# Patient Record
Sex: Female | Born: 1955 | Race: White | Hispanic: No | Smoking: Current every day smoker
Health system: Southern US, Community
[De-identification: ages and names within clinical notes are randomized; demographics above are authoritative.]

## PROBLEM LIST (undated history)

## (undated) DIAGNOSIS — F32A Depression, unspecified: Secondary | ICD-10-CM

## (undated) DIAGNOSIS — F419 Anxiety disorder, unspecified: Secondary | ICD-10-CM

## (undated) DIAGNOSIS — G43909 Migraine, unspecified, not intractable, without status migrainosus: Secondary | ICD-10-CM

## (undated) DIAGNOSIS — I10 Essential (primary) hypertension: Secondary | ICD-10-CM

## (undated) DIAGNOSIS — E785 Hyperlipidemia, unspecified: Secondary | ICD-10-CM

## (undated) HISTORY — DX: Depression, unspecified: F32.A

## (undated) HISTORY — PX: BREAST EXCISIONAL BIOPSY: SUR124

## (undated) HISTORY — PX: NASAL SINUS SURGERY: SHX719

## (undated) HISTORY — DX: Anxiety disorder, unspecified: F41.9

## (undated) HISTORY — PX: TUBAL LIGATION: SHX77

## (undated) HISTORY — DX: Essential (primary) hypertension: I10

## (undated) HISTORY — DX: Migraine, unspecified, not intractable, without status migrainosus: G43.909

## (undated) HISTORY — DX: Hyperlipidemia, unspecified: E78.5

## (undated) HISTORY — PX: CATARACT EXTRACTION: SUR2

---

## 1998-01-22 ENCOUNTER — Emergency Department (HOSPITAL_COMMUNITY): Admission: EM | Admit: 1998-01-22 | Discharge: 1998-01-22 | Payer: Self-pay | Admitting: Emergency Medicine

## 1998-01-25 ENCOUNTER — Emergency Department (HOSPITAL_COMMUNITY): Admission: EM | Admit: 1998-01-25 | Discharge: 1998-01-25 | Payer: Self-pay | Admitting: Emergency Medicine

## 1998-01-31 ENCOUNTER — Emergency Department (HOSPITAL_COMMUNITY): Admission: EM | Admit: 1998-01-31 | Discharge: 1998-01-31 | Payer: Self-pay | Admitting: Emergency Medicine

## 1998-03-24 ENCOUNTER — Emergency Department (HOSPITAL_COMMUNITY): Admission: EM | Admit: 1998-03-24 | Discharge: 1998-03-24 | Payer: Self-pay | Admitting: Emergency Medicine

## 1999-04-27 ENCOUNTER — Encounter: Payer: Self-pay | Admitting: *Deleted

## 1999-04-27 ENCOUNTER — Encounter: Admission: RE | Admit: 1999-04-27 | Discharge: 1999-04-27 | Payer: Self-pay | Admitting: *Deleted

## 1999-05-18 ENCOUNTER — Encounter (INDEPENDENT_AMBULATORY_CARE_PROVIDER_SITE_OTHER): Payer: Self-pay | Admitting: *Deleted

## 1999-05-18 ENCOUNTER — Ambulatory Visit (HOSPITAL_BASED_OUTPATIENT_CLINIC_OR_DEPARTMENT_OTHER): Admission: RE | Admit: 1999-05-18 | Discharge: 1999-05-18 | Payer: Self-pay | Admitting: *Deleted

## 2002-09-24 ENCOUNTER — Ambulatory Visit (HOSPITAL_COMMUNITY): Admission: RE | Admit: 2002-09-24 | Discharge: 2002-09-24 | Payer: Self-pay | Admitting: Unknown Physician Specialty

## 2002-09-24 ENCOUNTER — Encounter: Payer: Self-pay | Admitting: Unknown Physician Specialty

## 2009-10-13 ENCOUNTER — Ambulatory Visit (HOSPITAL_COMMUNITY): Admission: RE | Admit: 2009-10-13 | Discharge: 2009-10-13 | Payer: Self-pay | Admitting: Ophthalmology

## 2010-05-12 LAB — BASIC METABOLIC PANEL
BUN: 8 mg/dL (ref 6–23)
CO2: 28 mEq/L (ref 19–32)
Calcium: 9.5 mg/dL (ref 8.4–10.5)
Chloride: 107 mEq/L (ref 96–112)
Creatinine, Ser: 0.67 mg/dL (ref 0.4–1.2)
GFR calc Af Amer: >60 mL/min (ref >60)
GFR calc non Af Amer: >60 mL/min (ref >60)
Glucose, Bld: 69 mg/dL — ABNORMAL LOW (ref 70–99)
Potassium: 4.2 mEq/L (ref 3.5–5.1)
Sodium: 139 mEq/L (ref 135–145)

## 2010-05-12 LAB — HEMOGLOBIN AND HEMATOCRIT, BLOOD
HCT: 37.6 % (ref 36.0–46.0)
Hemoglobin: 12.8 g/dL (ref 12.0–15.0)

## 2010-07-14 NOTE — Op Note (Signed)
[place]  Patient:    Tammy Proctor, Tammy Proctor                     MRN: [id number] Proc. Date: 05/18/99 Adm. Date:  [id number] Attending:  Claudina Lick Dictator:   Doris Cheadle. Lyman Bishop, M.D.                           Operative Report  PREOPERATIVE DIAGNOSES: 1. Deviated nasal septum. 2. Nasal turbinate hypertrophy.  POSTOPERATIVE DIAGNOSES: 1. Deviated nasal septum. 2. Nasal turbinate hypertrophy.  OPERATIONS PERFORMED: 1. Nasal septoplasty. 2. SMR right inferior nasal turbinate. 3. Partial excision of right middle turbinate.  SURGEON:  Dr. Garrison Columbus.  ANESTHESIA:  General.  INDICATIONS:  This patient has had a long-standing history of chronic nasal obstruction with associated headaches, particularly in and around her eyes.  CT  scan of sinuses unremarkable except for a very small amount of mucosal thickening of the right sphenoid sinus and a small ostium of the right frontal sinus.  The  patient is admitted for surgery.  DESCRIPTION OF PROCEDURE:  After satisfactory general endotracheal anesthesia had been induced, topical epinephrine packs were placed intranasally, after which the nasal septum and the right middle and inferior turbinates were infiltrated with 1% Xylocaine containing 1:100,000 epinephrine for hemostasis, after which the nose and face were prepped were Betadine and sterile drapes applied.  Externally, the patients nose was displaced to the right with a mashed appearance. Intranasally, there was a severe septal deviation to the left that looked traumatic in origin  with the entire septum displaced off the maxillary crest to the left with marked enlargement of the right middle and inferior turbinates.  A left anterior septal incision was made, and the mucoperichondrium periosteum elevated initially on the left side only.  The cartilage was dissected up from the maxillary crest, and then partially  separated from the bony septum, after which the mucoperiosteal flap on the right side was then elevated.  A large spur was removed from the left side f the maxillary crest with a chisel, and deviated portion of the periappendicular  plate and vomer were removed with the Jansen-Middleton bone-biting forceps. The almost entire quadrangular cartilage was preserved.  It was then placed in the midline.  A small 1 x 1.5 cm fragment of thin perpendicular plate was replaced s a free graft in the posterior septal flaps, and the cartilage was then secured in the midline over the remaining maxillary crest with three mattress sutures of 5-0 Vicryl placed in the [facility] technique.  An incision was made in the left posterior septal flap for drainage purposes, and the septal incision closed with running -0 chromic gut. The right middle turbinate which bulged medially against the septum was crushed and lateralized with a blunt flat bladed instrument, but the anterior portion still projected into the airway and this was excised with the turbinate  scissors.  Incision was made over the anterior end of the right inferior nasal turbinate which showed bony enlargement projecting towards the midline.  The mucoperiosteum was elevated.  Excess turbinate bone was removed.  The incision as closed with running 5-0 chromic gut.  A small pledget of Surgicel was placed over the resected margin of the right middle turbinate, and each side of the nose was then packed with a folding strip of Telfa gauze coated with Cortisporin ointment.  ESTIMATED BLOOD LOSS:  10 to 15 cc.  The patient tolerated the procedure well.  Was awakened from anesthesia and taken to the recovery room in satisfactory condition. DD:  05/18/99 TD:  05/18/99 Job: [id number]

## 2013-07-06 ENCOUNTER — Encounter (INDEPENDENT_AMBULATORY_CARE_PROVIDER_SITE_OTHER): Payer: Self-pay

## 2013-07-06 ENCOUNTER — Ambulatory Visit (INDEPENDENT_AMBULATORY_CARE_PROVIDER_SITE_OTHER): Payer: Self-pay | Admitting: Family Medicine

## 2013-07-06 ENCOUNTER — Encounter: Payer: Self-pay | Admitting: Family Medicine

## 2013-07-06 VITALS — BP 104/64 | HR 58 | Temp 97.1°F | Ht 64.0 in | Wt 133.4 lb

## 2013-07-06 DIAGNOSIS — W57XXXA Bitten or stung by nonvenomous insect and other nonvenomous arthropods, initial encounter: Secondary | ICD-10-CM

## 2013-07-06 DIAGNOSIS — T148 Other injury of unspecified body region: Secondary | ICD-10-CM

## 2013-07-06 MED ORDER — DOXYCYCLINE HYCLATE 100 MG PO TABS: 100.0000 mg | ORAL_TABLET | Freq: Two times a day (BID) | ORAL | Status: DC

## 2013-07-06 NOTE — Progress Notes (Signed)
   Subjective:    Patient ID: Tammy Proctor, female    DOB: [birth year], 56 y.o.   MRN: [id number]  HPI  This 58 y.o. female presents for evaluation of tick bite and the tick is attached to her left upper abdomen.  Review of Systems C/o tick bite   No chest pain, SOB, HA, dizziness, vision change, N/V, diarrhea, constipation, dysuria, urinary urgency or frequency, myalgias, arthralgias or rash.  Objective:   Physical Exam  Vital signs noted  Well developed well nourished female.  HEENT - Head atraumatic Normocephalic                Eyes - PERRLA, Conjuctiva - clear Sclera- Clear EOMI                Ears - EAC's Wnl TM's Wnl Gross Hearing WNL Respiratory - Lungs CTA bilateral Cardiac - RRR S1 and S2 without murmur GI - Abdomen soft Nontender and bowel sounds active x 4 Skin - Tick attached to left upper quadrant of abdomen and the Tick is pulled out using forceps.      Assessment & Plan:  Tick bite - Plan: doxycycline (VIBRA-TABS) 100 MG tablet po bid x 10 days  Follow up prn  Lysbeth Penner FNP

## 2013-07-07 ENCOUNTER — Encounter: Payer: Self-pay | Admitting: *Deleted

## 2013-07-07 ENCOUNTER — Telehealth: Payer: Self-pay | Admitting: Family Medicine

## 2013-07-07 NOTE — Telephone Encounter (Signed)
Is this okay to do?

## 2013-07-10 ENCOUNTER — Ambulatory Visit: Payer: Self-pay | Admitting: Family Medicine

## 2013-07-10 VITALS — BP 138/88 | HR 80 | Temp 96.3°F | Ht 64.0 in | Wt 132.0 lb

## 2013-07-10 DIAGNOSIS — A088 Other specified intestinal infections: Secondary | ICD-10-CM

## 2013-07-10 DIAGNOSIS — A084 Viral intestinal infection, unspecified: Secondary | ICD-10-CM

## 2013-07-10 MED ORDER — PROMETHAZINE HCL 25 MG PO TABS: 25.0000 mg | ORAL_TABLET | Freq: Three times a day (TID) | ORAL | Status: DC | PRN

## 2013-07-10 NOTE — Progress Notes (Signed)
   Subjective:    Patient ID: Tammy Proctor, female    DOB: Nov 27, 1955, 58 y.o.   MRN: [id number]  HPI This 58 y.o. female presents for evaluation of nausea and she is feeling weak.  She was bitten by A tick and was seen for this a few weeks ago.  She is nauseated this am.  She is taking doxycycline 100mg  po bid.   Review of Systems No chest pain, SOB, HA, dizziness, vision change, N/V, diarrhea, constipation, dysuria, urinary urgency or frequency, myalgias, arthralgias or rash.     Objective:   Physical Exam  Vital signs noted  Well developed well nourished female.  HEENT - Head atraumatic Normocephalic                Eyes - PERRLA, Conjuctiva - clear Sclera- Clear EOMI                Ears - EAC's Wnl TM's Wnl Gross Hearing WNL                 Throat - oropharanx wnl Respiratory - Lungs CTA bilateral Cardiac - RRR S1 and S2 without murmur GI - Abdomen soft Nontender and bowel sounds active x 4 Extremities - No edema. Neuro - Grossly intact.      Assessment & Plan:  Viral gastroenteritis - Plan: promethazine (PHENERGAN) 25 MG tablet One po tid prn Nausea.  Work note for today given  Lysbeth Penner FNP

## 2013-07-16 ENCOUNTER — Telehealth: Payer: Self-pay | Admitting: Family Medicine

## 2013-07-16 ENCOUNTER — Other Ambulatory Visit: Payer: Self-pay | Admitting: Family Medicine

## 2013-07-16 NOTE — Telephone Encounter (Signed)
Follow up.

## 2013-07-21 NOTE — Telephone Encounter (Signed)
Try other OTC creams for itching and if no relief please call for follow up appointment. Try benadryl.

## 2013-07-22 NOTE — Telephone Encounter (Signed)
ntbs

## 2013-09-11 ENCOUNTER — Telehealth: Payer: Self-pay | Admitting: Family Medicine

## 2013-09-11 ENCOUNTER — Other Ambulatory Visit: Payer: Self-pay | Admitting: Family Medicine

## 2013-09-11 DIAGNOSIS — A084 Viral intestinal infection, unspecified: Secondary | ICD-10-CM

## 2013-09-11 MED ORDER — PROMETHAZINE HCL 25 MG PO TABS: 25.0000 mg | ORAL_TABLET | Freq: Three times a day (TID) | ORAL | Status: DC | PRN

## 2013-09-11 NOTE — Telephone Encounter (Signed)
I sent the Rx to the pharmacy.

## 2013-09-28 ENCOUNTER — Other Ambulatory Visit: Payer: Self-pay | Admitting: Nurse Practitioner

## 2013-09-28 ENCOUNTER — Other Ambulatory Visit: Payer: Self-pay | Admitting: Family Medicine

## 2013-09-28 ENCOUNTER — Telehealth: Payer: Self-pay | Admitting: Family Medicine

## 2013-09-28 MED ORDER — ONDANSETRON 8 MG PO TBDP
8.0000 mg | ORAL_TABLET | Freq: Three times a day (TID) | ORAL | Status: DC | PRN
Start: 2013-09-28 — End: 2013-12-02

## 2013-09-28 NOTE — Telephone Encounter (Signed)
Continues to have episodes of nausea.  Explained that phenergan is sedative and I'm not certain that we can keep prescribing this. Also explained that nausea is a symptom that may need further exploration.  What do you suggest?

## 2013-09-28 NOTE — Telephone Encounter (Signed)
zofran rx sent to pharm

## 2013-10-29 ENCOUNTER — Other Ambulatory Visit (HOSPITAL_COMMUNITY): Payer: Self-pay | Admitting: Physician Assistant

## 2013-10-29 DIAGNOSIS — Z1231 Encounter for screening mammogram for malignant neoplasm of breast: Secondary | ICD-10-CM

## 2013-11-04 ENCOUNTER — Ambulatory Visit (HOSPITAL_COMMUNITY)
Admission: RE | Admit: 2013-11-04 | Discharge: 2013-11-04 | Disposition: A | Discharge status: Home / Self Care | Payer: Self-pay | Source: Ambulatory Visit | Attending: Physician Assistant | Admitting: Physician Assistant

## 2013-11-04 DIAGNOSIS — Z1231 Encounter for screening mammogram for malignant neoplasm of breast: Secondary | ICD-10-CM

## 2013-12-02 ENCOUNTER — Ambulatory Visit (INDEPENDENT_AMBULATORY_CARE_PROVIDER_SITE_OTHER): Payer: Self-pay | Admitting: Nurse Practitioner

## 2013-12-02 ENCOUNTER — Encounter: Payer: Self-pay | Admitting: Nurse Practitioner

## 2013-12-02 VITALS — BP 128/74 | HR 71 | Temp 97.8°F | Ht 64.0 in | Wt 119.0 lb

## 2013-12-02 DIAGNOSIS — R52 Pain, unspecified: Secondary | ICD-10-CM

## 2013-12-02 DIAGNOSIS — B9789 Other viral agents as the cause of diseases classified elsewhere: Secondary | ICD-10-CM

## 2013-12-02 DIAGNOSIS — J069 Acute upper respiratory infection, unspecified: Secondary | ICD-10-CM

## 2013-12-02 LAB — POCT INFLUENZA A/B
INFLUENZA B, POC: NEGATIVE
Influenza A, POC: NEGATIVE

## 2013-12-02 NOTE — Patient Instructions (Signed)
1. Take meds as prescribed 2. Use a cool mist humidifier especially during the winter months and when heat has been humid. 3. Use saline nose sprays frequently 4. Saline irrigations of the nose can be very helpful if done frequently.  * 4X daily for 1 week*  * Use of a nettie pot can be helpful with this. Follow directions with this* 5. Drink plenty of fluids 6. Keep thermostat turn down low 7.For any cough or congestion  Use plain Mucinex- regular strength or max strength is fine   * Children- consult with Pharmacist for dosing 8. For fever or aces or pains- take tylenol or ibuprofen appropriate for age and weight.  * for fevers greater than 101 orally you may alternate ibuprofen and tylenol every  3 hours.   Theraflu OTC

## 2013-12-02 NOTE — Progress Notes (Signed)
   Subjective:    Patient ID: Tammy Proctor, female    DOB: 22-Aug-1955, 58 y.o.   MRN: [id number]  HPI Patient in today- Had flu shot Monday and started getting sick yesterday- C/O  Body aches chills and headache.    Review of Systems  Constitutional: Negative.   HENT: Negative.   Respiratory: Negative.   Cardiovascular: Negative.   Gastrointestinal: Positive for nausea, vomiting and diarrhea.  Skin: Negative.   Neurological: Negative.   Psychiatric/Behavioral: Negative.   All other systems reviewed and are negative.      Objective:   Physical Exam  Constitutional: She is oriented to person, place, and time. She appears well-developed and well-nourished.  HENT:  Head: Normocephalic.  Right Ear: External ear normal.  Left Ear: External ear normal.  Nose: Nose normal.  Mouth/Throat: Oropharynx is clear and moist.  Eyes: Pupils are equal, round, and reactive to light.  Neck: Normal range of motion. Neck supple.  Cardiovascular: Normal rate, regular rhythm and normal heart sounds.   Pulmonary/Chest: Effort normal and breath sounds normal.  Abdominal: Soft. Bowel sounds are normal.  Neurological: She is alert and oriented to person, place, and time.  Skin: Skin is warm.  Psychiatric: She has a normal mood and affect. Her behavior is normal. Judgment and thought content normal.  BP 128/74  Pulse 71  Temp(Src) 97.8 F (36.6 C) (Oral)  Ht 5\' 4"  (1.626 m)  Wt 119 lb (53.978 kg)  BMI 20.42 kg/m2 Results for orders placed in visit on 12/02/13  POCT INFLUENZA A/B      Result Value Ref Range   Influenza A, POC Negative     Influenza B, POC Negative             Assessment & Plan:   1. Body aches   2. Viral URI with cough    1. Take meds as prescribed 2. Use a cool mist humidifier especially during the winter months and when heat has been humid. 3. Use saline nose sprays frequently 4. Saline irrigations of the nose can be very helpful if done frequently.  * 4X  daily for 1 week*  * Use of a nettie pot can be helpful with this. Follow directions with this* 5. Drink plenty of fluids 6. Keep thermostat turn down low 7.For any cough or congestion  Use plain Mucinex- regular strength or max strength is fine   * Children- consult with Pharmacist for dosing 8. For fever or aces or pains- take tylenol or ibuprofen appropriate for age and weight.  * for fevers greater than 101 orally you may alternate ibuprofen and tylenol every  3 hours.   May want to try Theraflu OTC  Mary-Margaret Hassell Done, FNP

## 2014-09-17 ENCOUNTER — Other Ambulatory Visit (HOSPITAL_COMMUNITY): Payer: Self-pay | Admitting: Physician Assistant

## 2014-09-17 ENCOUNTER — Other Ambulatory Visit (HOSPITAL_COMMUNITY): Payer: Self-pay | Admitting: Nurse Practitioner

## 2014-09-17 ENCOUNTER — Ambulatory Visit (HOSPITAL_COMMUNITY)
Admission: RE | Admit: 2014-09-17 | Discharge: 2014-09-17 | Disposition: A | Discharge status: Home / Self Care | Payer: Self-pay | Source: Ambulatory Visit | Attending: Physician Assistant | Admitting: Physician Assistant

## 2014-09-17 DIAGNOSIS — Z720 Tobacco use: Secondary | ICD-10-CM | POA: Insufficient documentation

## 2014-09-17 DIAGNOSIS — R634 Abnormal weight loss: Secondary | ICD-10-CM

## 2014-09-17 DIAGNOSIS — R059 Cough, unspecified: Secondary | ICD-10-CM

## 2014-09-17 DIAGNOSIS — R05 Cough: Secondary | ICD-10-CM

## 2015-01-18 ENCOUNTER — Encounter: Payer: Self-pay | Admitting: Physician Assistant

## 2015-01-18 ENCOUNTER — Ambulatory Visit: Payer: Self-pay | Admitting: Physician Assistant

## 2015-01-18 VITALS — BP 110/62 | HR 57 | Temp 97.5°F | Ht 62.0 in | Wt 115.2 lb

## 2015-01-18 DIAGNOSIS — I10 Essential (primary) hypertension: Secondary | ICD-10-CM

## 2015-01-18 DIAGNOSIS — M25521 Pain in right elbow: Secondary | ICD-10-CM

## 2015-01-18 DIAGNOSIS — F17210 Nicotine dependence, cigarettes, uncomplicated: Secondary | ICD-10-CM

## 2015-01-18 MED ORDER — AMLODIPINE BESYLATE 5 MG PO TABS: 5.0000 mg | ORAL_TABLET | Freq: Every day | ORAL | Status: DC

## 2015-01-18 NOTE — Patient Instructions (Signed)
Smoking Cessation, Tips for Success If you are ready to quit smoking, congratulations! You have chosen to help yourself be healthier. Cigarettes bring nicotine, tar, carbon monoxide, and other irritants into your body. Your lungs, heart, and blood vessels will be able to work better without these poisons. There are many different ways to quit smoking. Nicotine gum, nicotine patches, a nicotine inhaler, or nicotine nasal spray can help with physical craving. Hypnosis, support groups, and medicines help break the habit of smoking. WHAT THINGS CAN I DO TO MAKE QUITTING EASIER?  Here are some tips to help you quit for good:  Pick a date when you will quit smoking completely. Tell all of your friends and family about your plan to quit on that date.  Do not try to slowly cut down on the number of cigarettes you are smoking. Pick a quit date and quit smoking completely starting on that day.  Throw away all cigarettes.   Clean and remove all ashtrays from your home, work, and car.  On a card, write down your reasons for quitting. Carry the card with you and read it when you get the urge to smoke.  Cleanse your body of nicotine. Drink enough water and fluids to keep your urine clear or pale yellow. Do this after quitting to flush the nicotine from your body.  Learn to predict your moods. Do not let a bad situation be your excuse to have a cigarette. Some situations in your life might tempt you into wanting a cigarette.  Never have "just one" cigarette. It leads to wanting another and another. Remind yourself of your decision to quit.  Change habits associated with smoking. If you smoked while driving or when feeling stressed, try other activities to replace smoking. Stand up when drinking your coffee. Brush your teeth after eating. Sit in a different chair when you read the paper. Avoid alcohol while trying to quit, and try to drink fewer caffeinated beverages. Alcohol and caffeine may urge you to  smoke.  Avoid foods and drinks that can trigger a desire to smoke, such as sugary or spicy foods and alcohol.  Ask people who smoke not to smoke around you.  Have something planned to do right after eating or having a cup of coffee. For example, plan to take a walk or exercise.  Try a relaxation exercise to calm you down and decrease your stress. Remember, you may be tense and nervous for the first 2 weeks after you quit, but this will pass.  Find new activities to keep your hands busy. Play with a pen, coin, or rubber band. Doodle or draw things on paper.  Brush your teeth right after eating. This will help cut down on the craving for the taste of tobacco after meals. You can also try mouthwash.   Use oral substitutes in place of cigarettes. Try using lemon drops, carrots, cinnamon sticks, or chewing gum. Keep them handy so they are available when you have the urge to smoke.  When you have the urge to smoke, try deep breathing.  Designate your home as a nonsmoking area.  If you are a heavy smoker, ask your health care provider about a prescription for nicotine chewing gum. It can ease your withdrawal from nicotine.  Reward yourself. Set aside the cigarette money you save and buy yourself something nice.  Look for support from others. Join a support group or smoking cessation program. Ask someone at home or at work to help you with your plan   to quit smoking.  Always ask yourself, "Do I need this cigarette or is this just a reflex?" Tell yourself, "Today, I choose not to smoke," or "I do not want to smoke." You are reminding yourself of your decision to quit.  Do not replace cigarette smoking with electronic cigarettes (commonly called e-cigarettes). The safety of e-cigarettes is unknown, and some may contain harmful chemicals.  If you relapse, do not give up! Plan ahead and think about what you will do the next time you get the urge to smoke. HOW WILL I FEEL WHEN I QUIT SMOKING? You  may have symptoms of withdrawal because your body is used to nicotine (the addictive substance in cigarettes). You may crave cigarettes, be irritable, feel very hungry, cough often, get headaches, or have difficulty concentrating. The withdrawal symptoms are only temporary. They are strongest when you first quit but will go away within 10-14 days. When withdrawal symptoms occur, stay in control. Think about your reasons for quitting. Remind yourself that these are signs that your body is healing and getting used to being without cigarettes. Remember that withdrawal symptoms are easier to treat than the major diseases that smoking can cause.  Even after the withdrawal is over, expect periodic urges to smoke. However, these cravings are generally short lived and will go away whether you smoke or not. Do not smoke! WHAT RESOURCES ARE AVAILABLE TO HELP ME QUIT SMOKING? Your health care provider can direct you to community resources or hospitals for support, which may include:  Group support.  Education.  Hypnosis.  Therapy.   This information is not intended to replace advice given to you by your health care provider. Make sure you discuss any questions you have with your health care provider.   Document Released: 11/11/2003 Document Revised: 03/05/2014 Document Reviewed: 07/31/2012 Elsevier Interactive Patient Education 2016 [employer]  

## 2015-01-18 NOTE — Progress Notes (Signed)
BP 110/62 mmHg  Pulse 57  Temp(Src) 97.5 F (36.4 C)  Ht 5\' 2"  (1.575 m)  Wt 115 lb 4 oz (52.277 kg)  BMI 21.07 kg/m2  SpO2 97%   Subjective:    Patient ID: Tammy Proctor, female    DOB: [birth year], 59 y.o.   MRN: [id number]  HPI: Tammy Proctor is a 59 y.o. female presenting on 01/18/2015 for Hypertension; Medication Problem; and Arm Pain   HPI  Chief Complaint  Patient presents with  . Hypertension  . Medication Problem    pt states on some days her medicine makes her feel dizzy or nauseous. pt states she "does not feel right", but it is not a daily occurance    Pt here for her BP today but c/o arm pai- R arm. pt states her arm "throbs to the point she wants to cry"  Pt says it hurts different times of the day.  Usually with lots using it and it throbs and hurts.  States aspirin helps some.    Pt is still smoking  Relevant past medical, surgical, family and social history reviewed and updated as indicated. Interim medical history since our last visit reviewed. Allergies and medications reviewed and updated.  Current outpatient prescriptions:  .  atenolol (TENORMIN) 50 MG tablet, Take 50 mg by mouth daily., Disp: , Rfl:    Review of Systems  Constitutional: Positive for chills, appetite change, fatigue and unexpected weight change. Negative for fever and diaphoresis.  HENT: Positive for congestion, dental problem, ear pain, sneezing and sore throat. Negative for drooling, facial swelling, hearing loss, mouth sores, trouble swallowing and voice change.   Eyes: Positive for pain, redness, itching and visual disturbance. Negative for discharge.  Respiratory: Positive for cough. Negative for choking, shortness of breath and wheezing.   Cardiovascular: Negative for chest pain, palpitations and leg swelling.  Gastrointestinal: Negative for vomiting, abdominal pain, diarrhea, constipation and blood in stool.  Endocrine: Positive for cold intolerance. Negative for  heat intolerance and polydipsia.  Genitourinary: Negative for dysuria, hematuria and decreased urine volume.  Musculoskeletal: Positive for back pain and arthralgias. Negative for gait problem.  Skin: Negative for rash.  Allergic/Immunologic: Positive for environmental allergies.  Neurological: Positive for light-headedness and headaches. Negative for seizures and syncope.  Hematological: Negative for adenopathy.  Psychiatric/Behavioral: Positive for agitation. Negative for suicidal ideas and dysphoric mood. The patient is nervous/anxious.     Per HPI unless specifically indicated above     Objective:    BP 110/62 mmHg  Pulse 57  Temp(Src) 97.5 F (36.4 C)  Ht 5\' 2"  (1.575 m)  Wt 115 lb 4 oz (52.277 kg)  BMI 21.07 kg/m2  SpO2 97%  Wt Readings from Last 3 Encounters:  01/18/15 115 lb 4 oz (52.277 kg)  12/02/13 119 lb (53.978 kg)  07/10/13 132 lb (59.875 kg)    Physical Exam  Constitutional: She is oriented to person, place, and time. She appears well-developed and well-nourished.  HENT:  Head: Normocephalic and atraumatic.  Neck: Neck supple.  Cardiovascular: Normal rate and regular rhythm.   Pulmonary/Chest: Effort normal and breath sounds normal.  Abdominal: Soft. Bowel sounds are normal. She exhibits no mass. There is no tenderness.  Musculoskeletal: She exhibits no edema.  Lymphadenopathy:    She has no cervical adenopathy.  Neurological: She is alert and oriented to person, place, and time.  Skin: Skin is warm and dry.  Psychiatric: She has a normal mood and affect. Her behavior is  normal.  Vitals reviewed.       Assessment & Plan:   Encounter Diagnoses  Name Primary?  . Essential hypertension, benign Yes  . Cigarette nicotine dependence, uncomplicated   . Pain in joint, upper arm, right    -Change bp meds to amlodipine 5- d/c atenolol -counseled on smoking cessation -f/u 1 mo to recheck bp

## 2015-01-22 DIAGNOSIS — F17210 Nicotine dependence, cigarettes, uncomplicated: Secondary | ICD-10-CM

## 2015-01-22 DIAGNOSIS — I10 Essential (primary) hypertension: Secondary | ICD-10-CM | POA: Insufficient documentation

## 2015-01-22 DIAGNOSIS — Z720 Tobacco use: Secondary | ICD-10-CM | POA: Insufficient documentation

## 2015-01-22 DIAGNOSIS — F17200 Nicotine dependence, unspecified, uncomplicated: Secondary | ICD-10-CM | POA: Insufficient documentation

## 2015-01-25 ENCOUNTER — Encounter: Payer: Self-pay | Admitting: Physician Assistant

## 2015-01-25 ENCOUNTER — Ambulatory Visit: Payer: Self-pay | Admitting: Physician Assistant

## 2015-01-25 VITALS — BP 110/58 | HR 60 | Temp 97.7°F | Ht 63.0 in | Wt 111.5 lb

## 2015-01-25 DIAGNOSIS — J069 Acute upper respiratory infection, unspecified: Secondary | ICD-10-CM

## 2015-01-25 MED ORDER — AMOXICILLIN 500 MG PO CAPS: 500.0000 mg | ORAL_CAPSULE | Freq: Three times a day (TID) | ORAL | Status: DC

## 2015-01-25 NOTE — Progress Notes (Signed)
BP 110/58 mmHg  Pulse 60  Temp(Src) 97.7 F (36.5 C)  Ht 5\' 3"  (1.6 m)  Wt 111 lb 8 oz (50.576 kg)  BMI 19.76 kg/m2  SpO2 95%   Subjective:    Patient ID: Tammy Proctor, female    DOB: 09/12/1955, 59 y.o.   MRN: [id number]  HPI: Tammy Proctor is a 59 y.o. female presenting on 01/25/2015 for URI and Sinus Problem   URI  The current episode started in the past 7 days (onset last wednesday). There has been no fever. Associated symptoms include congestion, coughing, ear pain, headaches, nausea, neck pain, rhinorrhea, sinus pain and sneezing. Pertinent negatives include no abdominal pain, chest pain, diarrhea, dysuria, rash, sore throat, swollen glands, vomiting or wheezing. She has tried NSAIDs (saline spray) for the symptoms. The treatment provided mild relief.   Chief Complaint  Patient presents with  . URI    mouth and teeth hurt off and on for 1 week  . Sinus Problem    for a week, nasal drainage is thick and dark     Relevant past medical, surgical, family and social history reviewed and updated as indicated. Interim medical history since our last visit reviewed. Allergies and medications reviewed and updated.   Current outpatient prescriptions:  .  atenolol (TENORMIN) 50 MG tablet, Take 50 mg by mouth daily., Disp: , Rfl:  .  amLODipine (NORVASC) 5 MG tablet, Take 1 tablet (5 mg total) by mouth daily. (Patient not taking: Reported on 01/25/2015), Disp: 90 tablet, Rfl: 3   Review of Systems  Constitutional: Positive for chills, diaphoresis and fatigue. Negative for fever, appetite change and unexpected weight change.  HENT: Positive for congestion, dental problem, ear pain, facial swelling, rhinorrhea and sneezing. Negative for drooling, hearing loss, mouth sores, sore throat, trouble swallowing and voice change.   Eyes: Positive for pain, discharge, redness, itching and visual disturbance.  Respiratory: Positive for cough and shortness of breath. Negative for  choking and wheezing.   Cardiovascular: Negative for chest pain, palpitations and leg swelling.  Gastrointestinal: Positive for nausea. Negative for vomiting, abdominal pain, diarrhea, constipation and blood in stool.  Endocrine: Positive for cold intolerance and heat intolerance. Negative for polydipsia.  Genitourinary: Negative for dysuria, hematuria and decreased urine volume.  Musculoskeletal: Positive for back pain, arthralgias and neck pain. Negative for gait problem.  Skin: Negative for rash.  Allergic/Immunologic: Positive for environmental allergies.  Neurological: Positive for light-headedness and headaches. Negative for seizures and syncope.  Hematological: Negative for adenopathy.  Psychiatric/Behavioral: Positive for agitation. Negative for suicidal ideas and dysphoric mood. The patient is nervous/anxious.     Per HPI unless specifically indicated above     Objective:    BP 110/58 mmHg  Pulse 60  Temp(Src) 97.7 F (36.5 C)  Ht 5\' 3"  (1.6 m)  Wt 111 lb 8 oz (50.576 kg)  BMI 19.76 kg/m2  SpO2 95%  Wt Readings from Last 3 Encounters:  01/25/15 111 lb 8 oz (50.576 kg)  01/18/15 115 lb 4 oz (52.277 kg)  12/02/13 119 lb (53.978 kg)    Physical Exam  Constitutional: She is oriented to person, place, and time. She appears well-developed and well-nourished.  HENT:  Head: Normocephalic and atraumatic.  Right Ear: Hearing, tympanic membrane, external ear and ear canal normal.  Left Ear: Hearing, tympanic membrane, external ear and ear canal normal.  Nose: Nose normal.  Mouth/Throat: Uvula is midline and oropharynx is clear and moist. No oropharyngeal exudate.  Face  without swelling  Neck: Neck supple.  Cardiovascular: Normal rate and regular rhythm.   Pulmonary/Chest: Effort normal and breath sounds normal. She has no wheezes.  Lymphadenopathy:    She has no cervical adenopathy.  Neurological: She is alert and oriented to person, place, and time.  Skin: Skin is warm  and dry.  Psychiatric: She has a normal mood and affect. Her behavior is normal.  Vitals reviewed.       Assessment & Plan:   Encounter Diagnosis  Name Primary?  . URI, acute Yes    No smoking rx amoxil otc's prn symptomatic relief

## 2015-02-23 ENCOUNTER — Ambulatory Visit: Payer: Self-pay | Admitting: Physician Assistant

## 2015-03-02 ENCOUNTER — Encounter: Payer: Self-pay | Admitting: Physician Assistant

## 2015-03-02 ENCOUNTER — Ambulatory Visit: Payer: Self-pay | Admitting: Physician Assistant

## 2015-03-02 VITALS — BP 130/72 | HR 68 | Temp 97.2°F | Ht 63.0 in | Wt 116.8 lb

## 2015-03-02 DIAGNOSIS — I10 Essential (primary) hypertension: Secondary | ICD-10-CM

## 2015-03-02 DIAGNOSIS — F17210 Nicotine dependence, cigarettes, uncomplicated: Secondary | ICD-10-CM

## 2015-03-02 NOTE — Patient Instructions (Signed)
Smoking Cessation, Tips for Success If you are ready to quit smoking, congratulations! You have chosen to help yourself be healthier. Cigarettes bring nicotine, tar, carbon monoxide, and other irritants into your body. Your lungs, heart, and blood vessels will be able to work better without these poisons. There are many different ways to quit smoking. Nicotine gum, nicotine patches, a nicotine inhaler, or nicotine nasal spray can help with physical craving. Hypnosis, support groups, and medicines help break the habit of smoking. WHAT THINGS CAN I DO TO MAKE QUITTING EASIER?  Here are some tips to help you quit for good:  Pick a date when you will quit smoking completely. Tell all of your friends and family about your plan to quit on that date.  Do not try to slowly cut down on the number of cigarettes you are smoking. Pick a quit date and quit smoking completely starting on that day.  Throw away all cigarettes.   Clean and remove all ashtrays from your home, work, and car.  On a card, write down your reasons for quitting. Carry the card with you and read it when you get the urge to smoke.  Cleanse your body of nicotine. Drink enough water and fluids to keep your urine clear or pale yellow. Do this after quitting to flush the nicotine from your body.  Learn to predict your moods. Do not let a bad situation be your excuse to have a cigarette. Some situations in your life might tempt you into wanting a cigarette.  Never have "just one" cigarette. It leads to wanting another and another. Remind yourself of your decision to quit.  Change habits associated with smoking. If you smoked while driving or when feeling stressed, try other activities to replace smoking. Stand up when drinking your coffee. Brush your teeth after eating. Sit in a different chair when you read the paper. Avoid alcohol while trying to quit, and try to drink fewer caffeinated beverages. Alcohol and caffeine may urge you to  smoke.  Avoid foods and drinks that can trigger a desire to smoke, such as sugary or spicy foods and alcohol.  Ask people who smoke not to smoke around you.  Have something planned to do right after eating or having a cup of coffee. For example, plan to take a walk or exercise.  Try a relaxation exercise to calm you down and decrease your stress. Remember, you may be tense and nervous for the first 2 weeks after you quit, but this will pass.  Find new activities to keep your hands busy. Play with a pen, coin, or rubber band. Doodle or draw things on paper.  Brush your teeth right after eating. This will help cut down on the craving for the taste of tobacco after meals. You can also try mouthwash.   Use oral substitutes in place of cigarettes. Try using lemon drops, carrots, cinnamon sticks, or chewing gum. Keep them handy so they are available when you have the urge to smoke.  When you have the urge to smoke, try deep breathing.  Designate your home as a nonsmoking area.  If you are a heavy smoker, ask your health care provider about a prescription for nicotine chewing gum. It can ease your withdrawal from nicotine.  Reward yourself. Set aside the cigarette money you save and buy yourself something nice.  Look for support from others. Join a support group or smoking cessation program. Ask someone at home or at work to help you with your plan   to quit smoking.  Always ask yourself, "Do I need this cigarette or is this just a reflex?" Tell yourself, "Today, I choose not to smoke," or "I do not want to smoke." You are reminding yourself of your decision to quit.  Do not replace cigarette smoking with electronic cigarettes (commonly called e-cigarettes). The safety of e-cigarettes is unknown, and some may contain harmful chemicals.  If you relapse, do not give up! Plan ahead and think about what you will do the next time you get the urge to smoke. HOW WILL I FEEL WHEN I QUIT SMOKING? You  may have symptoms of withdrawal because your body is used to nicotine (the addictive substance in cigarettes). You may crave cigarettes, be irritable, feel very hungry, cough often, get headaches, or have difficulty concentrating. The withdrawal symptoms are only temporary. They are strongest when you first quit but will go away within 10-14 days. When withdrawal symptoms occur, stay in control. Think about your reasons for quitting. Remind yourself that these are signs that your body is healing and getting used to being without cigarettes. Remember that withdrawal symptoms are easier to treat than the major diseases that smoking can cause.  Even after the withdrawal is over, expect periodic urges to smoke. However, these cravings are generally short lived and will go away whether you smoke or not. Do not smoke! WHAT RESOURCES ARE AVAILABLE TO HELP ME QUIT SMOKING? Your health care provider can direct you to community resources or hospitals for support, which may include:  Group support.  Education.  Hypnosis.  Therapy.   This information is not intended to replace advice given to you by your health care provider. Make sure you discuss any questions you have with your health care provider.   Document Released: 11/11/2003 Document Revised: 03/05/2014 Document Reviewed: 07/31/2012 Elsevier Interactive Patient Education 2016 [employer]  

## 2015-03-02 NOTE — Progress Notes (Signed)
BP 130/72 mmHg  Pulse 68  Temp(Src) 97.2 F (36.2 C)  Ht 5\' 3"  (1.6 m)  Wt 116 lb 12.8 oz (52.98 kg)  BMI 20.70 kg/m2  SpO2 99%   Subjective:    Patient ID: Tammy Proctor, female    DOB: [birth year], 60 y.o.   MRN: [id number]  HPI: Tammy Proctor is a 60 y.o. female presenting on 03/02/2015 for Hypertension and Arm Pain   HPI Pt did not get started on her new mediciine b/c her son picked up the old rx.  She still wants to change to the new one   Pt states R arm pain comes and goes, worse with cold weather.  She injured it in the past- fell while decorating for christmas last year.  Relevant past medical, surgical, family and social history reviewed and updated as indicated. Interim medical history since our last visit reviewed. Allergies and medications reviewed and updated.  Current outpatient prescriptions:  .  atenolol (TENORMIN) 50 MG tablet, Take 50 mg by mouth daily., Disp: , Rfl:  .  amLODipine (NORVASC) 5 MG tablet, Take 1 tablet (5 mg total) by mouth daily. (Patient not taking: Reported on 01/25/2015), Disp: 90 tablet, Rfl: 3  Review of Systems  Constitutional: Positive for chills, diaphoresis and fatigue. Negative for fever, appetite change and unexpected weight change.  HENT: Positive for congestion and dental problem. Negative for drooling, ear pain, facial swelling, hearing loss, mouth sores, sneezing, sore throat, trouble swallowing and voice change.   Eyes: Positive for pain, redness, itching and visual disturbance. Negative for discharge.  Respiratory: Positive for cough and shortness of breath. Negative for choking and wheezing.   Cardiovascular: Negative for chest pain, palpitations and leg swelling.  Gastrointestinal: Negative for vomiting, abdominal pain, diarrhea, constipation and blood in stool.  Endocrine: Positive for cold intolerance and heat intolerance. Negative for polydipsia.  Genitourinary: Negative for dysuria, hematuria and decreased urine  volume.  Musculoskeletal: Positive for back pain and arthralgias. Negative for gait problem.  Skin: Negative for rash.  Allergic/Immunologic: Positive for environmental allergies.  Neurological: Positive for headaches. Negative for seizures, syncope and light-headedness.  Hematological: Negative for adenopathy.  Psychiatric/Behavioral: Negative for suicidal ideas, dysphoric mood and agitation. The patient is nervous/anxious.     Per HPI unless specifically indicated above     Objective:    BP 130/72 mmHg  Pulse 68  Temp(Src) 97.2 F (36.2 C)  Ht 5\' 3"  (1.6 m)  Wt 116 lb 12.8 oz (52.98 kg)  BMI 20.70 kg/m2  SpO2 99%  Wt Readings from Last 3 Encounters:  03/02/15 116 lb 12.8 oz (52.98 kg)  01/25/15 111 lb 8 oz (50.576 kg)  01/18/15 115 lb 4 oz (52.277 kg)    Physical Exam  Constitutional: She is oriented to person, place, and time. She appears well-developed and well-nourished.  HENT:  Head: Normocephalic and atraumatic.  Neck: Neck supple.  Cardiovascular: Normal rate and regular rhythm.   Pulmonary/Chest: Effort normal and breath sounds normal.  Abdominal: Soft. Bowel sounds are normal. She exhibits no mass. There is no tenderness.  Musculoskeletal: She exhibits no edema.  Lymphadenopathy:    She has no cervical adenopathy.  Neurological: She is alert and oriented to person, place, and time.  Skin: Skin is warm and dry.  Psychiatric: She has a normal mood and affect. Her behavior is normal.  Vitals reviewed.       Assessment & Plan:    Encounter Diagnoses  Name Primary?  Marland Kitchen  Essential hypertension, benign Yes  . Cigarette nicotine dependence, uncomplicated      Can change bp med at next refill Counseled on smoking cessation F/u 6 wk to check bp on new medicaiton

## 2015-03-31 ENCOUNTER — Other Ambulatory Visit: Payer: Self-pay | Admitting: Physician Assistant

## 2015-04-04 ENCOUNTER — Encounter: Payer: Self-pay | Admitting: Physician Assistant

## 2015-04-04 ENCOUNTER — Ambulatory Visit: Payer: Self-pay | Admitting: Physician Assistant

## 2015-04-04 VITALS — BP 124/70 | HR 79 | Temp 97.9°F | Ht 63.0 in | Wt 114.8 lb

## 2015-04-04 DIAGNOSIS — F4321 Adjustment disorder with depressed mood: Secondary | ICD-10-CM

## 2015-04-04 DIAGNOSIS — I10 Essential (primary) hypertension: Secondary | ICD-10-CM

## 2015-04-04 NOTE — Progress Notes (Signed)
   BP 124/70 mmHg  Pulse 79  Temp(Src) 97.9 F (36.6 C)  Ht 5\' 3"  (1.6 m)  Wt 114 lb 12.8 oz (52.073 kg)  BMI 20.34 kg/m2  SpO2 99%   Subjective:    Patient ID: Tammy Proctor, female    DOB: Nov 04, 1955, 60 y.o.   MRN: [id number]  HPI: Tammy Proctor is a 60 y.o. female presenting on 04/04/2015 for Mental Health Problem   HPI Pt states she has had problems since death of her daughter in law last Monday.  She feels very anxious.  No HI or SI.  Just very upset.  Relevant past medical, surgical, family and social history reviewed and updated as indicated. Interim medical history since our last visit reviewed. Allergies and medications reviewed and updated.   Current outpatient prescriptions:  .  amLODipine (NORVASC) 5 MG tablet, Take 1 tablet (5 mg total) by mouth daily., Disp: 90 tablet, Rfl: 3   Review of Systems  Constitutional: Positive for appetite change and fatigue. Negative for fever, chills, diaphoresis and unexpected weight change.  HENT: Positive for congestion, dental problem and sneezing. Negative for drooling, ear pain, facial swelling, hearing loss, mouth sores, sore throat, trouble swallowing and voice change.   Eyes: Positive for itching. Negative for pain, discharge, redness and visual disturbance.  Respiratory: Positive for cough. Negative for choking, shortness of breath and wheezing.   Cardiovascular: Negative for chest pain, palpitations and leg swelling.  Gastrointestinal: Negative for vomiting, abdominal pain, diarrhea, constipation and blood in stool.  Endocrine: Positive for cold intolerance and heat intolerance. Negative for polydipsia.  Genitourinary: Negative for dysuria, hematuria and decreased urine volume.  Musculoskeletal: Positive for back pain and arthralgias. Negative for gait problem.  Skin: Negative for rash.  Allergic/Immunologic: Positive for environmental allergies.  Neurological: Positive for headaches. Negative for seizures, syncope  and light-headedness.  Hematological: Negative for adenopathy.  Psychiatric/Behavioral: Positive for dysphoric mood and agitation. Negative for suicidal ideas. The patient is nervous/anxious.     Per HPI unless specifically indicated above     Objective:    BP 124/70 mmHg  Pulse 79  Temp(Src) 97.9 F (36.6 C)  Ht 5\' 3"  (1.6 m)  Wt 114 lb 12.8 oz (52.073 kg)  BMI 20.34 kg/m2  SpO2 99%  Wt Readings from Last 3 Encounters:  04/04/15 114 lb 12.8 oz (52.073 kg)  03/02/15 116 lb 12.8 oz (52.98 kg)  01/25/15 111 lb 8 oz (50.576 kg)    Physical Exam  Constitutional: She is oriented to person, place, and time. She appears well-developed and well-nourished.  HENT:  Head: Normocephalic and atraumatic.  Neck: Neck supple.  Cardiovascular: Normal rate and regular rhythm.   Pulmonary/Chest: Effort normal and breath sounds normal.  Abdominal: Soft. Bowel sounds are normal. She exhibits no mass. There is no hepatosplenomegaly. There is no tenderness.  Musculoskeletal: She exhibits no edema.  Lymphadenopathy:    She has no cervical adenopathy.  Neurological: She is alert and oriented to person, place, and time.  Skin: Skin is warm and dry.  Psychiatric: She has a normal mood and affect. Her behavior is normal.  Vitals reviewed.       Assessment & Plan:   Encounter Diagnoses  Name Primary?  . Situational depression Yes  . Essential hypertension, benign     -bp improved today. Continue current rx -Go to walk in at [facility] today when leaves office.  Pt agrees. -f/u 6 wk to check bp

## 2015-04-13 ENCOUNTER — Ambulatory Visit: Payer: Self-pay | Admitting: Physician Assistant

## 2015-05-16 ENCOUNTER — Ambulatory Visit: Payer: Self-pay | Admitting: Physician Assistant

## 2015-05-16 ENCOUNTER — Encounter: Payer: Self-pay | Admitting: Physician Assistant

## 2015-05-16 VITALS — BP 142/74 | HR 89 | Temp 99.3°F | Ht 63.0 in | Wt 115.2 lb

## 2015-05-16 DIAGNOSIS — I10 Essential (primary) hypertension: Secondary | ICD-10-CM

## 2015-05-16 DIAGNOSIS — F17210 Nicotine dependence, cigarettes, uncomplicated: Secondary | ICD-10-CM

## 2015-05-16 DIAGNOSIS — F4321 Adjustment disorder with depressed mood: Secondary | ICD-10-CM | POA: Insufficient documentation

## 2015-05-16 MED ORDER — AMLODIPINE BESYLATE 10 MG PO TABS: 10.0000 mg | ORAL_TABLET | Freq: Every day | ORAL | Status: DC

## 2015-05-16 NOTE — Progress Notes (Signed)
   BP 142/74 mmHg  Pulse 89  Temp(Src) 99.3 F (37.4 C)  Ht 5\' 3"  (1.6 m)  Wt 115 lb 4 oz (52.277 kg)  BMI 20.42 kg/m2  SpO2 99%   Subjective:    Patient ID: Tammy Proctor, female    DOB: 11/09/1955, 60 y.o.   MRN: [id number]  HPI: Tammy Proctor is a 60 y.o. female presenting on 05/16/2015 for Hypertension   HPI Pt got pap done.   She never went in to [facility] facility.  Pt had labs in September 2016 which were good  Relevant past medical, surgical, family and social history reviewed and updated as indicated. Interim medical history since our last visit reviewed. Allergies and medications reviewed and updated.   Current outpatient prescriptions:  .  amLODipine (NORVASC) 5 MG tablet, Take 1 tablet (5 mg total) by mouth daily., Disp: 90 tablet, Rfl: 3   Review of Systems  Constitutional: Positive for fatigue. Negative for fever, chills, diaphoresis, appetite change and unexpected weight change.  HENT: Positive for congestion, dental problem and sneezing. Negative for drooling, ear pain, facial swelling, hearing loss, mouth sores, sore throat, trouble swallowing and voice change.   Eyes: Positive for discharge, itching and visual disturbance. Negative for pain and redness.  Respiratory: Positive for cough and shortness of breath. Negative for choking and wheezing.   Cardiovascular: Negative for chest pain, palpitations and leg swelling.  Gastrointestinal: Negative for vomiting, abdominal pain, diarrhea, constipation and blood in stool.  Endocrine: Positive for cold intolerance and heat intolerance. Negative for polydipsia.  Genitourinary: Negative for dysuria, hematuria and decreased urine volume.  Musculoskeletal: Positive for back pain and arthralgias. Negative for gait problem.  Skin: Negative for rash.  Allergic/Immunologic: Positive for environmental allergies.  Neurological: Negative for seizures, syncope, light-headedness and headaches.  Hematological: Negative for  adenopathy.  Psychiatric/Behavioral: Positive for agitation. Negative for suicidal ideas and dysphoric mood. The patient is nervous/anxious.     Per HPI unless specifically indicated above     Objective:    BP 142/74 mmHg  Pulse 89  Temp(Src) 99.3 F (37.4 C)  Ht 5\' 3"  (1.6 m)  Wt 115 lb 4 oz (52.277 kg)  BMI 20.42 kg/m2  SpO2 99%  Wt Readings from Last 3 Encounters:  05/16/15 115 lb 4 oz (52.277 kg)  04/04/15 114 lb 12.8 oz (52.073 kg)  03/02/15 116 lb 12.8 oz (52.98 kg)    Physical Exam  Constitutional: She is oriented to person, place, and time. She appears well-developed and well-nourished.  HENT:  Head: Normocephalic and atraumatic.  Neck: Neck supple.  Cardiovascular: Normal rate and regular rhythm.   Pulmonary/Chest: Effort normal and breath sounds normal.  Abdominal: Soft. Bowel sounds are normal. She exhibits no mass. There is no hepatosplenomegaly. There is no tenderness.  Musculoskeletal: She exhibits no edema.  Lymphadenopathy:    She has no cervical adenopathy.  Neurological: She is alert and oriented to person, place, and time.  Skin: Skin is warm and dry.  Psychiatric: She has a normal mood and affect. Her behavior is normal.  Vitals reviewed.       Assessment & Plan:   Encounter Diagnoses  Name Primary?  . Essential hypertension, benign Yes  . Cigarette nicotine dependence, uncomplicated   . Situational depression      -Increase amlodipine to 10- sign up for [facility] -Pt to go to [facility] -will get PAP report- record request sent -F/u 1 mo to recheck bp

## 2015-06-13 ENCOUNTER — Ambulatory Visit: Payer: Self-pay | Admitting: Physician Assistant

## 2015-06-13 ENCOUNTER — Encounter: Payer: Self-pay | Admitting: Physician Assistant

## 2015-06-13 VITALS — BP 130/70 | HR 93 | Temp 98.1°F | Ht 63.0 in | Wt 112.0 lb

## 2015-06-13 DIAGNOSIS — I10 Essential (primary) hypertension: Secondary | ICD-10-CM

## 2015-06-13 DIAGNOSIS — F17210 Nicotine dependence, cigarettes, uncomplicated: Secondary | ICD-10-CM

## 2015-06-13 DIAGNOSIS — J309 Allergic rhinitis, unspecified: Secondary | ICD-10-CM

## 2015-06-13 NOTE — Progress Notes (Signed)
   BP 130/70 mmHg  Pulse 93  Temp(Src) 98.1 F (36.7 C)  Ht 5\' 3"  (1.6 m)  Wt 112 lb (50.803 kg)  BMI 19.84 kg/m2  SpO2 99%   Subjective:    Patient ID: Tammy Proctor, female    DOB: 1956-02-23, 60 y.o.   MRN: [id number]  HPI: Tammy Proctor is a 60 y.o. female presenting on 06/13/2015 for Hypertension   HPI   Pt doing well except for some allergies, she says.  She is still smoking.  Relevant past medical, surgical, family and social history reviewed and updated as indicated. Interim medical history since our last visit reviewed. Allergies and medications reviewed and updated.  Current outpatient prescriptions:  .  amLODipine (NORVASC) 10 MG tablet, Take 1 tablet (10 mg total) by mouth daily., Disp: 30 tablet, Rfl: 3   Review of Systems  Constitutional: Positive for fatigue. Negative for fever, activity change and unexpected weight change.  HENT: Positive for congestion, dental problem and sneezing. Negative for drooling, facial swelling, hearing loss, mouth sores, sore throat, trouble swallowing and voice change.   Eyes: Positive for itching. Negative for discharge.  Respiratory: Positive for cough and shortness of breath. Negative for choking and wheezing.   Cardiovascular: Negative for chest pain, palpitations and leg swelling.  Gastrointestinal: Negative for diarrhea, constipation and blood in stool.  Endocrine: Positive for cold intolerance and heat intolerance. Negative for polydipsia.  Genitourinary: Negative for dysuria, hematuria and decreased urine volume.  Musculoskeletal: Positive for back pain and arthralgias. Negative for gait problem.  Neurological: Negative for syncope and headaches.  Hematological: Negative for adenopathy.  Psychiatric/Behavioral: Positive for dysphoric mood and agitation. Negative for suicidal ideas. The patient is nervous/anxious.     Per HPI unless specifically indicated above     Objective:    BP 130/70 mmHg  Pulse 93   Temp(Src) 98.1 F (36.7 C)  Ht 5\' 3"  (1.6 m)  Wt 112 lb (50.803 kg)  BMI 19.84 kg/m2  SpO2 99%  Wt Readings from Last 3 Encounters:  06/13/15 112 lb (50.803 kg)  05/16/15 115 lb 4 oz (52.277 kg)  04/04/15 114 lb 12.8 oz (52.073 kg)    Physical Exam  Constitutional: She is oriented to person, place, and time. She appears well-developed and well-nourished.  HENT:  Head: Normocephalic and atraumatic.  Right Ear: Hearing, tympanic membrane, external ear and ear canal normal.  Left Ear: Hearing, tympanic membrane, external ear and ear canal normal.  Nose: Rhinorrhea present.  Mouth/Throat: Uvula is midline and oropharynx is clear and moist. No oropharyngeal exudate.  Neck: Neck supple.  Cardiovascular: Normal rate and regular rhythm.   Pulmonary/Chest: Effort normal and breath sounds normal. She has no wheezes.  Abdominal: Soft. Bowel sounds are normal. She exhibits no mass. There is no hepatosplenomegaly. There is no tenderness.  Musculoskeletal: She exhibits no edema.  Lymphadenopathy:    She has no cervical adenopathy.  Neurological: She is alert and oriented to person, place, and time.  Skin: Skin is warm and dry.  Psychiatric: She has a normal mood and affect. Her behavior is normal.  Vitals reviewed.       Assessment & Plan:   Encounter Diagnoses  Name Primary?  . Essential hypertension, benign Yes  . Cigarette nicotine dependence, uncomplicated   . Allergic rhinitis, unspecified allergic rhinitis type     -recommend pt get otc claritan for allergies -Continue current medications -counseled on smoking cessation -F/u 3 months. RTO sooner prn

## 2015-06-23 ENCOUNTER — Encounter: Payer: Self-pay | Admitting: Physician Assistant

## 2015-07-13 ENCOUNTER — Ambulatory Visit: Payer: Self-pay | Admitting: Physician Assistant

## 2015-07-14 ENCOUNTER — Other Ambulatory Visit: Payer: Self-pay | Admitting: Physician Assistant

## 2015-07-14 MED ORDER — AMLODIPINE BESYLATE 10 MG PO TABS
10.0000 mg | ORAL_TABLET | Freq: Every day | ORAL | Status: DC
Start: 2015-07-14 — End: 2015-10-04

## 2015-07-18 ENCOUNTER — Encounter: Payer: Self-pay | Admitting: Physician Assistant

## 2015-09-05 ENCOUNTER — Other Ambulatory Visit: Payer: Self-pay

## 2015-09-05 DIAGNOSIS — I10 Essential (primary) hypertension: Secondary | ICD-10-CM

## 2015-09-09 LAB — BASIC METABOLIC PANEL
BUN: 12 mg/dL (ref 7–25)
CALCIUM: 10 mg/dL (ref 8.6–10.4)
CO2: 24 mmol/L (ref 20–31)
CREATININE: 1.02 mg/dL (ref 0.50–1.05)
Chloride: 105 mmol/L (ref 98–110)
GLUCOSE: 92 mg/dL (ref 65–99)
POTASSIUM: 4.6 mmol/L (ref 3.5–5.3)
Sodium: 139 mmol/L (ref 135–146)

## 2015-09-12 ENCOUNTER — Ambulatory Visit: Payer: Self-pay | Admitting: Physician Assistant

## 2015-09-12 ENCOUNTER — Encounter: Payer: Self-pay | Admitting: Physician Assistant

## 2015-09-12 VITALS — BP 126/72 | HR 77 | Temp 97.9°F | Ht 63.0 in | Wt 114.0 lb

## 2015-09-12 DIAGNOSIS — I10 Essential (primary) hypertension: Secondary | ICD-10-CM

## 2015-09-12 DIAGNOSIS — F39 Unspecified mood [affective] disorder: Secondary | ICD-10-CM

## 2015-09-12 DIAGNOSIS — F17210 Nicotine dependence, cigarettes, uncomplicated: Secondary | ICD-10-CM

## 2015-09-12 DIAGNOSIS — Z1239 Encounter for other screening for malignant neoplasm of breast: Secondary | ICD-10-CM

## 2015-09-12 NOTE — Patient Instructions (Signed)
Contact [facility] or [facility] for [place]  Smoking Cessation, Tips for Success If you are ready to quit smoking, congratulations! You have chosen to help yourself be healthier. Cigarettes bring nicotine, tar, carbon monoxide, and other irritants into your body. Your lungs, heart, and blood vessels will be able to work better without these poisons. There are many different ways to quit smoking. Nicotine gum, nicotine patches, a nicotine inhaler, or nicotine nasal spray can help with physical craving. Hypnosis, support groups, and medicines help break the habit of smoking. WHAT THINGS CAN I DO TO MAKE QUITTING EASIER?  Here are some tips to help you quit for good:  Pick a date when you will quit smoking completely. Tell all of your friends and family about your plan to quit on that date.  Do not try to slowly cut down on the number of cigarettes you are smoking. Pick a quit date and quit smoking completely starting on that day.  Throw away all cigarettes.   Clean and remove all ashtrays from your home, work, and car.  On a card, write down your reasons for quitting. Carry the card with you and read it when you get the urge to smoke.  Cleanse your body of nicotine. Drink enough water and fluids to keep your urine clear or pale yellow. Do this after quitting to flush the nicotine from your body.  Learn to predict your moods. Do not let a bad situation be your excuse to have a cigarette. Some situations in your life might tempt you into wanting a cigarette.  Never have "just one" cigarette. It leads to wanting another and another. Remind yourself of your decision to quit.  Change habits associated with smoking. If you smoked while driving or when feeling stressed, try other activities to replace smoking. Stand up when drinking your coffee. Brush your teeth after eating. Sit in a different chair when you read the paper. Avoid alcohol while trying to quit, and try to drink fewer  caffeinated beverages. Alcohol and caffeine may urge you to smoke.  Avoid foods and drinks that can trigger a desire to smoke, such as sugary or spicy foods and alcohol.  Ask people who smoke not to smoke around you.  Have something planned to do right after eating or having a cup of coffee. For example, plan to take a walk or exercise.  Try a relaxation exercise to calm you down and decrease your stress. Remember, you may be tense and nervous for the first 2 weeks after you quit, but this will pass.  Find new activities to keep your hands busy. Play with a pen, coin, or rubber band. Doodle or draw things on paper.  Brush your teeth right after eating. This will help cut down on the craving for the taste of tobacco after meals. You can also try mouthwash.   Use oral substitutes in place of cigarettes. Try using lemon drops, carrots, cinnamon sticks, or chewing gum. Keep them handy so they are available when you have the urge to smoke.  When you have the urge to smoke, try deep breathing.  Designate your home as a nonsmoking area.  If you are a heavy smoker, ask your health care provider about a prescription for nicotine chewing gum. It can ease your withdrawal from nicotine.  Reward yourself. Set aside the cigarette money you save and buy yourself something nice.  Look for support from others. Join a support group or smoking cessation program. Ask someone at  home or at work to help you with your plan to quit smoking.  Always ask yourself, "Do I need this cigarette or is this just a reflex?" Tell yourself, "Today, I choose not to smoke," or "I do not want to smoke." You are reminding yourself of your decision to quit.  Do not replace cigarette smoking with electronic cigarettes (commonly called e-cigarettes). The safety of e-cigarettes is unknown, and some may contain harmful chemicals.  If you relapse, do not give up! Plan ahead and think about what you will do the next time you get  the urge to smoke. HOW WILL I FEEL WHEN I QUIT SMOKING? You may have symptoms of withdrawal because your body is used to nicotine (the addictive substance in cigarettes). You may crave cigarettes, be irritable, feel very hungry, cough often, get headaches, or have difficulty concentrating. The withdrawal symptoms are only temporary. They are strongest when you first quit but will go away within 10-14 days. When withdrawal symptoms occur, stay in control. Think about your reasons for quitting. Remind yourself that these are signs that your body is healing and getting used to being without cigarettes. Remember that withdrawal symptoms are easier to treat than the major diseases that smoking can cause.  Even after the withdrawal is over, expect periodic urges to smoke. However, these cravings are generally short lived and will go away whether you smoke or not. Do not smoke! WHAT RESOURCES ARE AVAILABLE TO HELP ME QUIT SMOKING? Your health care provider can direct you to community resources or hospitals for support, which may include:  Group support.  Education.  Hypnosis.  Therapy.   This information is not intended to replace advice given to you by your health care provider. Make sure you discuss any questions you have with your health care provider.   Document Released: 11/11/2003 Document Revised: 03/05/2014 Document Reviewed: 07/31/2012 Elsevier Interactive Patient Education [employer].

## 2015-09-12 NOTE — Progress Notes (Signed)
BP 126/72 mmHg  Pulse 77  Temp(Src) 97.9 F (36.6 C)  Ht 5\' 3"  (1.6 m)  Wt 114 lb (51.71 kg)  BMI 20.20 kg/m2  SpO2 98%   Subjective:    Patient ID: Tammy Proctor, female    DOB: October 09, 1955, 60 y.o.   MRN: [id number]  HPI: Tammy Proctor is a 60 y.o. female presenting on 09/12/2015 for Hypertension   HPI   Pt has still not been to [facility] center.  She has contact information- she says she just hasn't gotten around to it.  She is still having lots of anxiety and depression  Relevant past medical, surgical, family and social history reviewed and updated as indicated. Interim medical history since our last visit reviewed. Allergies and medications reviewed and updated.   Current outpatient prescriptions:  .  amLODipine (NORVASC) 10 MG tablet, Take 1 tablet (10 mg total) by mouth daily., Disp: 90 tablet, Rfl: 0   Review of Systems  Constitutional: Positive for chills, diaphoresis and fatigue. Negative for fever, appetite change and unexpected weight change.  HENT: Positive for dental problem, ear pain and sneezing. Negative for congestion, drooling, facial swelling, hearing loss, mouth sores, sore throat, trouble swallowing and voice change.   Eyes: Positive for redness and itching. Negative for pain, discharge and visual disturbance.  Respiratory: Positive for cough and shortness of breath. Negative for choking and wheezing.   Cardiovascular: Negative for chest pain, palpitations and leg swelling.  Gastrointestinal: Negative for vomiting, abdominal pain, diarrhea, constipation and blood in stool.  Endocrine: Positive for cold intolerance and heat intolerance. Negative for polydipsia.  Genitourinary: Negative for dysuria, hematuria and decreased urine volume.  Musculoskeletal: Positive for back pain and arthralgias. Negative for gait problem.  Skin: Negative for rash.  Allergic/Immunologic: Positive for environmental allergies.  Neurological: Positive for headaches. Negative  for seizures, syncope and light-headedness.  Hematological: Negative for adenopathy.  Psychiatric/Behavioral: Positive for dysphoric mood and agitation. Negative for suicidal ideas. The patient is nervous/anxious.     Per HPI unless specifically indicated above     Objective:    BP 126/72 mmHg  Pulse 77  Temp(Src) 97.9 F (36.6 C)  Ht 5\' 3"  (1.6 m)  Wt 114 lb (51.71 kg)  BMI 20.20 kg/m2  SpO2 98%  Wt Readings from Last 3 Encounters:  09/12/15 114 lb (51.71 kg)  06/13/15 112 lb (50.803 kg)  05/16/15 115 lb 4 oz (52.277 kg)    Physical Exam  Constitutional: She is oriented to person, place, and time. She appears well-developed and well-nourished.  HENT:  Head: Normocephalic and atraumatic.  Right Ear: Hearing, tympanic membrane, external ear and ear canal normal.  Left Ear: Hearing, tympanic membrane, external ear and ear canal normal.  Nose: Nose normal.  Mouth/Throat: Uvula is midline and oropharynx is clear and moist. No oropharyngeal exudate.  Neck: Neck supple.  Cardiovascular: Normal rate and regular rhythm.   Pulmonary/Chest: Effort normal and breath sounds normal. She has no wheezes.  Abdominal: Soft. Bowel sounds are normal. She exhibits no mass. There is no hepatosplenomegaly. There is no tenderness.  Musculoskeletal: She exhibits no edema.  Lymphadenopathy:    She has no cervical adenopathy.  Neurological: She is alert and oriented to person, place, and time.  Skin: Skin is warm and dry.  Psychiatric: She has a normal mood and affect. Her behavior is normal.  Vitals reviewed.   Results for orders placed or performed in visit on [id number]  Basic Metabolic Panel (BMET)  Result  Value Ref Range   Sodium 139 135 - 146 mmol/L   Potassium 4.6 3.5 - 5.3 mmol/L   Chloride 105 98 - 110 mmol/L   CO2 24 20 - 31 mmol/L   Glucose, Bld 92 65 - 99 mg/dL   BUN 12 7 - 25 mg/dL   Creat 1.02 0.50 - 1.05 mg/dL   Calcium 10.0 8.6 - 10.4 mg/dL      Assessment & Plan:    Encounter Diagnoses  Name Primary?  . Essential hypertension, benign Yes  . Cigarette nicotine dependence, uncomplicated   . Mood disorder ([place])   . Screening for breast cancer     -reviewed labs with pt -order screening mammogram -urged pt to contact [place] -counseled smoking cessation -Continue current medications.  F/u 4 months.  RTO sooner prn

## 2015-09-21 ENCOUNTER — Ambulatory Visit (HOSPITAL_COMMUNITY): Admission: RE | Admit: 2015-09-21 | Payer: Self-pay | Source: Ambulatory Visit

## 2015-09-21 ENCOUNTER — Other Ambulatory Visit: Payer: Self-pay | Admitting: Physician Assistant

## 2015-09-21 ENCOUNTER — Encounter (HOSPITAL_COMMUNITY): Payer: Self-pay

## 2015-09-21 ENCOUNTER — Ambulatory Visit (HOSPITAL_COMMUNITY)
Admission: RE | Admit: 2015-09-21 | Discharge: 2015-09-21 | Disposition: A | Discharge status: Home / Self Care | Payer: Self-pay | Source: Ambulatory Visit | Attending: Physician Assistant | Admitting: Physician Assistant

## 2015-09-21 DIAGNOSIS — Z1231 Encounter for screening mammogram for malignant neoplasm of breast: Secondary | ICD-10-CM

## 2015-10-04 ENCOUNTER — Other Ambulatory Visit: Payer: Self-pay | Admitting: Physician Assistant

## 2015-11-28 ENCOUNTER — Ambulatory Visit: Payer: Self-pay

## 2016-01-16 ENCOUNTER — Encounter: Payer: Self-pay | Admitting: Physician Assistant

## 2016-01-16 ENCOUNTER — Ambulatory Visit: Payer: Self-pay | Admitting: Physician Assistant

## 2016-01-16 VITALS — BP 133/81 | HR 100 | Temp 98.2°F

## 2016-01-16 DIAGNOSIS — F39 Unspecified mood [affective] disorder: Secondary | ICD-10-CM

## 2016-01-16 DIAGNOSIS — I10 Essential (primary) hypertension: Secondary | ICD-10-CM

## 2016-01-16 DIAGNOSIS — R062 Wheezing: Secondary | ICD-10-CM

## 2016-01-16 DIAGNOSIS — F17218 Nicotine dependence, cigarettes, with other nicotine-induced disorders: Secondary | ICD-10-CM

## 2016-01-16 MED ORDER — PREDNISONE 10 MG PO TABS: ORAL_TABLET | ORAL | 0 refills | Status: AC

## 2016-01-16 NOTE — Patient Instructions (Signed)
Upper Respiratory Infection, Adult Most upper respiratory infections (URIs) are a viral infection of the air passages leading to the lungs. A URI affects the nose, throat, and upper air passages. The most common type of URI is nasopharyngitis and is typically referred to as "the common cold." URIs run their course and usually go away on their own. Most of the time, a URI does not require medical attention, but sometimes a bacterial infection in the upper airways can follow a viral infection. This is called a secondary infection. Sinus and middle ear infections are common types of secondary upper respiratory infections. Bacterial pneumonia can also complicate a URI. A URI can worsen asthma and chronic obstructive pulmonary disease (COPD). Sometimes, these complications can require emergency medical care and may be life threatening. What are the causes? Almost all URIs are caused by viruses. A virus is a type of germ and can spread from one person to another. What increases the risk? You may be at risk for a URI if:  You smoke.  You have chronic heart or lung disease.  You have a weakened defense (immune) system.  You are very young or very old.  You have nasal allergies or asthma.  You work in crowded or poorly ventilated areas.  You work in health care facilities or schools.  What are the signs or symptoms? Symptoms typically develop 2-3 days after you come in contact with a cold virus. Most viral URIs last 7-10 days. However, viral URIs from the influenza virus (flu virus) can last 14-18 days and are typically more severe. Symptoms may include:  Runny or stuffy (congested) nose.  Sneezing.  Cough.  Sore throat.  Headache.  Fatigue.  Fever.  Loss of appetite.  Pain in your forehead, behind your eyes, and over your cheekbones (sinus pain).  Muscle aches.  How is this diagnosed? Your health care provider may diagnose a URI by:  Physical exam.  Tests to check that your  symptoms are not due to another condition such as: ? Strep throat. ? Sinusitis. ? Pneumonia. ? Asthma.  How is this treated? A URI goes away on its own with time. It cannot be cured with medicines, but medicines may be prescribed or recommended to relieve symptoms. Medicines may help:  Reduce your fever.  Reduce your cough.  Relieve nasal congestion.  Follow these instructions at home:  Take medicines only as directed by your health care provider.  Gargle warm saltwater or take cough drops to comfort your throat as directed by your health care provider.  Use a warm mist humidifier or inhale steam from a shower to increase air moisture. This may make it easier to breathe.  Drink enough fluid to keep your urine clear or pale yellow.  Eat soups and other clear broths and maintain good nutrition.  Rest as needed.  Return to work when your temperature has returned to normal or as your health care provider advises. You may need to stay home longer to avoid infecting others. You can also use a face mask and careful hand washing to prevent spread of the virus.  Increase the usage of your inhaler if you have asthma.  Do not use any tobacco products, including cigarettes, chewing tobacco, or electronic cigarettes. If you need help quitting, ask your health care provider. How is this prevented? The best way to protect yourself from getting a cold is to practice good hygiene.  Avoid oral or hand contact with people with cold symptoms.  Wash your   hands often if contact occurs.  There is no clear evidence that vitamin C, vitamin E, echinacea, or exercise reduces the chance of developing a cold. However, it is always recommended to get plenty of rest, exercise, and practice good nutrition. Contact a health care provider if:  You are getting worse rather than better.  Your symptoms are not controlled by medicine.  You have chills.  You have worsening shortness of breath.  You have  brown or red mucus.  You have yellow or brown nasal discharge.  You have pain in your face, especially when you bend forward.  You have a fever.  You have swollen neck glands.  You have pain while swallowing.  You have white areas in the back of your throat. Get help right away if:  You have severe or persistent: ? Headache. ? Ear pain. ? Sinus pain. ? Chest pain.  You have chronic lung disease and any of the following: ? Wheezing. ? Prolonged cough. ? Coughing up blood. ? A change in your usual mucus.  You have a stiff neck.  You have changes in your: ? Vision. ? Hearing. ? Thinking. ? Mood. This information is not intended to replace advice given to you by your health care provider. Make sure you discuss any questions you have with your health care provider. Document Released: 08/08/2000 Document Revised: 10/16/2015 Document Reviewed: 05/20/2013 Elsevier Interactive Patient Education  2017 Elsevier Inc.  

## 2016-01-16 NOTE — Progress Notes (Signed)
BP 133/81   Pulse 100   Temp 98.2 F (36.8 C)   SpO2 98%    Subjective:    Patient ID: Tammy Proctor, female    DOB: [birth year], 60 y.o.   MRN: [id number]  HPI: Tammy Proctor is a 60 y.o. female presenting on 01/16/2016 for Hypertension   HPI   Pt c/o cold.   Some wheezing.  She is still smoking  She has still not contacted [facility] for anxiety/depression as recommended at several appointments.  She says she has been busy.  Relevant past medical, surgical, family and social history reviewed and updated as indicated. Interim medical history since our last visit reviewed. Allergies and medications reviewed and updated.   Current Outpatient Prescriptions:  .  amLODipine (NORVASC) 10 MG tablet, TAKE 1 Tablet BY MOUTH ONCE DAILY, Disp: 90 tablet, Rfl: 2 .  Pseudoephedrine HCl (DECONGESTANT PO), Take by mouth., Disp: , Rfl:    Review of Systems  Constitutional: Positive for chills and fatigue. Negative for appetite change, diaphoresis, fever and unexpected weight change.  HENT: Positive for congestion, dental problem, ear pain, mouth sores, sneezing and sore throat. Negative for drooling, facial swelling, hearing loss, trouble swallowing and voice change.   Eyes: Positive for discharge, redness and itching. Negative for pain and visual disturbance.  Respiratory: Positive for cough, chest tightness, shortness of breath and wheezing. Negative for choking.   Cardiovascular: Negative for chest pain, palpitations and leg swelling.  Gastrointestinal: Negative for abdominal pain, blood in stool, constipation, diarrhea and vomiting.  Endocrine: Positive for cold intolerance and heat intolerance. Negative for polydipsia.  Genitourinary: Negative for decreased urine volume, dysuria and hematuria.  Musculoskeletal: Positive for arthralgias and back pain. Negative for gait problem.  Skin: Negative for rash.  Allergic/Immunologic: Positive for environmental allergies.   Neurological: Positive for headaches. Negative for seizures, syncope and light-headedness.  Hematological: Negative for adenopathy.  Psychiatric/Behavioral: Positive for agitation and dysphoric mood. Negative for suicidal ideas. The patient is nervous/anxious.     Per HPI unless specifically indicated above     Objective:    BP 133/81   Pulse 100   Temp 98.2 F (36.8 C)   SpO2 98%   Wt Readings from Last 3 Encounters:  09/12/15 114 lb (51.7 kg)  06/13/15 112 lb (50.8 kg)  05/16/15 115 lb 4 oz (52.3 kg)    Physical Exam  Constitutional: She is oriented to person, place, and time. She appears well-developed and well-nourished.  HENT:  Head: Normocephalic and atraumatic.  Right Ear: Hearing, tympanic membrane, external ear and ear canal normal.  Left Ear: Hearing, tympanic membrane, external ear and ear canal normal.  Nose: Nose normal.  Mouth/Throat: Uvula is midline and oropharynx is clear and moist. No oropharyngeal exudate.  Neck: Neck supple.  Cardiovascular: Normal rate and regular rhythm.   Pulmonary/Chest: Effort normal. No respiratory distress. She has no decreased breath sounds. She has wheezes (occassional scattered expiratory). She has no rhonchi. She has no rales.  Abdominal: Soft. Bowel sounds are normal. She exhibits no mass. There is no hepatosplenomegaly. There is no tenderness.  Musculoskeletal: She exhibits no edema.  Lymphadenopathy:    She has no cervical adenopathy.  Neurological: She is alert and oriented to person, place, and time.  Skin: Skin is warm and dry.  Psychiatric: She has a normal mood and affect. Her behavior is normal.  Vitals reviewed.        Assessment & Plan:   Encounter Diagnoses  Name Primary?  Marland Kitchen  Essential hypertension, benign Yes  . Cigarette nicotine dependence with other nicotine-induced disorder   . Wheezing   . Mood disorder ([place])      -Counseled to avoid decongestants with HTN. -counseled smoking cessation -urged  pt to contact [facility] for mood -Continue amlodipine for blood pressure -rx prednisone taper for wheezing and URI -reading handout on URI and counseled on OTCs -follow up for bp in 4 months.  RTO sooner prn

## 2016-02-13 ENCOUNTER — Ambulatory Visit: Payer: Self-pay | Admitting: Physician Assistant

## 2016-03-07 NOTE — Congregational Nurse Program (Unsigned)
Congregational Nurse Program Note  Date of Encounter: 03/07/2016  Past Medical History: Past Medical History:  Diagnosis Date  . Anxiety   . Hypertension   . Migraine     Encounter Details:     CNP Questionnaire - 02/17/16 1127      Patient Demographics   Is this a new or existing patient? Existing   Patient is considered a/an Not Applicable   Race Caucasian/White     Patient Assistance   Location of Patient Assistance [place]   Patient's financial/insurance status Self-Pay (Uninsured);Low Income   Uninsured Patient ([facility]/[employer] Connects) No   Food insecurities addressed Provided food supplies   Transportation assistance No   Assistance securing medications No   Educational health offerings Nutrition;Safety;Health literacy     Encounter Details   Primary purpose of visit Education/Health Concerns   Was an Emergency Department visit averted? Not Applicable   Does patient have a medical provider? Yes   Patient referred to [place]   Was a mental health screening completed? (GAINS tool) No   Does patient have dental issues? No   Does patient have vision issues? No   Does your patient have an abnormal blood pressure today? No   Since previous encounter, have you referred patient for abnormal blood pressure that resulted in a new diagnosis or medication change? No   Does your patient have an abnormal blood glucose today? No   Since previous encounter, have you referred patient for abnormal blood glucose that resulted in a new diagnosis or medication change? No   Was there a life-saving intervention made? No     BP 127/76 Pulse 86 Wanted BP checked.  Goes to [employer] Theron Arista, [place]  [phone]

## 2016-04-12 ENCOUNTER — Other Ambulatory Visit: Payer: Self-pay | Admitting: Physician Assistant

## 2016-04-12 MED ORDER — AMLODIPINE BESYLATE 10 MG PO TABS: 10.0000 mg | ORAL_TABLET | Freq: Every day | ORAL | 2 refills | Status: DC

## 2016-05-14 ENCOUNTER — Encounter: Payer: Self-pay | Admitting: Physician Assistant

## 2016-05-14 ENCOUNTER — Ambulatory Visit: Payer: Self-pay | Admitting: Physician Assistant

## 2016-05-14 VITALS — BP 136/78 | HR 90 | Temp 98.1°F | Ht 63.0 in | Wt 122.8 lb

## 2016-05-14 DIAGNOSIS — I10 Essential (primary) hypertension: Secondary | ICD-10-CM

## 2016-05-14 DIAGNOSIS — F17218 Nicotine dependence, cigarettes, with other nicotine-induced disorders: Secondary | ICD-10-CM

## 2016-05-14 DIAGNOSIS — R5383 Other fatigue: Secondary | ICD-10-CM

## 2016-05-14 DIAGNOSIS — Z13220 Encounter for screening for lipoid disorders: Secondary | ICD-10-CM

## 2016-05-14 DIAGNOSIS — F39 Unspecified mood [affective] disorder: Secondary | ICD-10-CM

## 2016-05-14 DIAGNOSIS — F419 Anxiety disorder, unspecified: Secondary | ICD-10-CM | POA: Insufficient documentation

## 2016-05-14 NOTE — Progress Notes (Signed)
BP 136/78 (BP Location: Right Arm, Patient Position: Sitting, Cuff Size: Normal)   Pulse 90   Temp 98.1 F (36.7 C)   Ht 5\' 3"  (1.6 m)   Wt 122 lb 12 oz (55.7 kg)   SpO2 97%   BMI 21.74 kg/m    Subjective:    Patient ID: Tammy Proctor, female    DOB: 02/28/1955, 61 y.o.   MRN: [id number]  HPI: Tammy Proctor is a 61 y.o. female presenting on 05/14/2016 for Hypertension   HPI   Pt has still not contacted [facility] as recommended at multiple previous appointments.  She says he is caring for her brother now who has a complex infection.   Pt is still having a lot of anxiety.  She says she she has a panic attack, she has some increase in her sob and her chest feels tight and she feels very anxious.   Pt is still smoking.  She is not interested in stopping  c/o pain RUE for a few months.   She says it does not hurt all the time.  Denies injury    Relevant past medical, surgical, family and social history reviewed and updated as indicated. Interim medical history since our last visit reviewed. Allergies and medications reviewed and updated.   Current Outpatient Prescriptions:  .  amLODipine (NORVASC) 10 MG tablet, Take 1 tablet (10 mg total) by mouth daily., Disp: 90 tablet, Rfl: 2   Review of Systems  Constitutional: Positive for chills and fatigue. Negative for appetite change, diaphoresis, fever and unexpected weight change.  HENT: Positive for congestion and sneezing. Negative for dental problem, drooling, ear pain, facial swelling, hearing loss, mouth sores, sore throat, trouble swallowing and voice change.   Eyes: Positive for discharge, redness and itching. Negative for pain and visual disturbance.  Respiratory: Positive for cough. Negative for choking, shortness of breath and wheezing.   Cardiovascular: Negative for chest pain, palpitations and leg swelling.  Gastrointestinal: Negative for abdominal pain, blood in stool, constipation, diarrhea and vomiting.   Endocrine: Positive for cold intolerance and polydipsia. Negative for heat intolerance.  Genitourinary: Negative for decreased urine volume, dysuria and hematuria.  Musculoskeletal: Positive for arthralgias and back pain. Negative for gait problem.  Skin: Negative for rash.  Allergic/Immunologic: Positive for environmental allergies.  Neurological: Positive for headaches. Negative for seizures, syncope and light-headedness.  Hematological: Negative for adenopathy.  Psychiatric/Behavioral: Positive for agitation. Negative for dysphoric mood and suicidal ideas. The patient is nervous/anxious.     Per HPI unless specifically indicated above     Objective:    BP 136/78 (BP Location: Right Arm, Patient Position: Sitting, Cuff Size: Normal)   Pulse 90   Temp 98.1 F (36.7 C)   Ht 5\' 3"  (1.6 m)   Wt 122 lb 12 oz (55.7 kg)   SpO2 97%   BMI 21.74 kg/m   Wt Readings from Last 3 Encounters:  05/14/16 122 lb 12 oz (55.7 kg)  09/12/15 114 lb (51.7 kg)  06/13/15 112 lb (50.8 kg)    Physical Exam  Constitutional: She is oriented to person, place, and time. She appears well-developed and well-nourished.  HENT:  Head: Normocephalic and atraumatic.  Neck: Neck supple.  Cardiovascular: Normal rate and regular rhythm.   Pulmonary/Chest: Effort normal and breath sounds normal.  Abdominal: Soft. Bowel sounds are normal. She exhibits no mass. There is no hepatosplenomegaly. There is no tenderness.  Musculoskeletal: She exhibits no edema.       Right  shoulder: Normal. She exhibits normal range of motion, no tenderness, no bony tenderness, no swelling, no effusion and no deformity.  Lymphadenopathy:    She has no cervical adenopathy.  Neurological: She is alert and oriented to person, place, and time.  Skin: Skin is warm and dry.  Psychiatric: She has a normal mood and affect. Her behavior is normal.  Vitals reviewed.       Assessment & Plan:   Encounter Diagnoses  Name Primary?  .  Essential hypertension, benign Yes  . Cigarette nicotine dependence with other nicotine-induced disorder   . Mood disorder ([place])   . Anxiety   . Fatigue, unspecified type   . Screening cholesterol level     -check fasting labs.   -Urged pt to contact [facility] for her anxiety -follow up 2 months to recheck bp and anxiety.  RTO sooner prn

## 2016-05-24 NOTE — Congregational Nurse Program (Unsigned)
Congregational Nurse Program Note  Date of Encounter: 05/24/2016  Past Medical History: Past Medical History:  Diagnosis Date  . Anxiety   . Hypertension   . Migraine     Encounter Details:     CNP Questionnaire - 05/20/16 1150      Patient Demographics   Is this a new or existing patient? Existing   Patient is considered a/an Not Applicable   Race Caucasian/White     Patient Assistance   Location of Patient Assistance [place]   Uninsured Patient ([facility]/[employer] Connects) No   Patient referred to apply for the following financial assistance Not Applicable   Food insecurities addressed Provided food supplies   Transportation assistance No   Assistance securing medications No   Educational health offerings Hypertension;Nutrition;Safety     Encounter Details   Primary purpose of visit Education/Health Concerns   Was an Emergency Department visit averted? Not Applicable   Does patient have a medical provider? Yes   Patient referred to Not Applicable   Was a mental health screening completed? (GAINS tool) No   Does patient have vision issues? No   Does your patient have an abnormal blood pressure today? Yes   Since previous encounter, have you referred patient for abnormal blood pressure that resulted in a new diagnosis or medication change? No   Does your patient have an abnormal blood glucose today? No   Since previous encounter, have you referred patient for abnormal blood glucose that resulted in a new diagnosis or medication change? No   Was there a life-saving intervention made? No    3/2.5/18 Want B/P checked was off Medication x2 days was left @ brother's house.  B/P 140/78 pulse 77. Advised to get meds. And take as directed by MD. Theron Arista, RN [phone]

## 2016-05-30 LAB — CBC
HEMATOCRIT: 40.5 % (ref 35.0–45.0)
HEMOGLOBIN: 13.1 g/dL (ref 11.7–15.5)
MCH: 30.3 pg (ref 27.0–33.0)
MCHC: 32.3 g/dL (ref 32.0–36.0)
MCV: 93.5 fL (ref 80.0–100.0)
MPV: 9.6 fL (ref 7.5–12.5)
Platelets: 273 10*3/uL (ref 140–400)
RBC: 4.33 MIL/uL (ref 3.80–5.10)
RDW: 13.4 % (ref 11.0–15.0)
WBC: 7.7 10*3/uL (ref 3.8–10.8)

## 2016-05-30 LAB — COMPREHENSIVE METABOLIC PANEL
ALT: 10 U/L (ref 6–29)
AST: 17 U/L (ref 10–35)
Albumin: 4.3 g/dL (ref 3.6–5.1)
Alkaline Phosphatase: 61 U/L (ref 33–130)
BUN: 15 mg/dL (ref 7–25)
CALCIUM: 9.2 mg/dL (ref 8.6–10.4)
CHLORIDE: 105 mmol/L (ref 98–110)
CO2: 25 mmol/L (ref 20–31)
Creat: 0.94 mg/dL (ref 0.50–1.05)
GLUCOSE: 95 mg/dL (ref 65–99)
Potassium: 4.2 mmol/L (ref 3.5–5.3)
Sodium: 139 mmol/L (ref 135–146)
Total Bilirubin: 0.7 mg/dL (ref 0.2–1.2)
Total Protein: 6.6 g/dL (ref 6.1–8.1)

## 2016-05-30 LAB — LIPID PANEL
CHOL/HDL RATIO: 2.2 ratio (ref <5.0)
CHOLESTEROL: 217 mg/dL — AB (ref <200)
HDL: 99 mg/dL (ref >50)
LDL Cholesterol: 96 mg/dL (ref <100)
Triglycerides: 112 mg/dL (ref <150)
VLDL: 22 mg/dL (ref <30)

## 2016-07-03 NOTE — Congregational Nurse Program (Signed)
Congregational Nurse Program Note  Date of Encounter: 07/03/2016  Past Medical History: Past Medical History:  Diagnosis Date  . Anxiety   . Hypertension   . Migraine     Encounter Details:     CNP Questionnaire - 06/22/16 1330      Patient Demographics   Is this a new or existing patient? Existing   Patient is considered a/an Not Applicable   Race Caucasian/White     Patient Assistance   Location of Patient Assistance [place]   Patient's financial/insurance status Self-Pay (Uninsured)   Uninsured Patient ([facility]/Care Connects) No   Patient referred to apply for the following financial assistance Not Applicable   Food insecurities addressed Provided food supplies   Transportation assistance No   Assistance securing medications No   Educational health offerings Hypertension;Nutrition;Safety     Encounter Details   Primary purpose of visit Education/Health Concerns   Was an Emergency Department visit averted? Not Applicable   Does patient have a medical provider? No   Patient referred to Follow up with established PCP   Was a mental health screening completed? (GAINS tool) No   Does patient have dental issues? No   Does patient have vision issues? Yes   Does your patient have an abnormal blood pressure today? Yes   Since previous encounter, have you referred patient for abnormal blood pressure that resulted in a new diagnosis or medication change? Yes   Does your patient have an abnormal blood glucose today? No   Since previous encounter, have you referred patient for abnormal blood glucose that resulted in a new diagnosis or medication change? No   Was there a life-saving intervention made? No     06/22/16 13:30 hrs.  Stressed, close friend died. Want B/P checked. B/P 159/72 Pulse 88 To see Dr. Saunders Revel of [facility].  Plan to eye surgery later, has cataract on (L) eye.  Theron Arista, RN [phone]

## 2016-07-04 NOTE — Congregational Nurse Program (Unsigned)
Congregational Nurse Program Note  Date of Encounter: 07/04/2016  Past Medical History: Past Medical History:  Diagnosis Date  . Anxiety   . Hypertension   . Migraine     Encounter Details:     CNP Questionnaire - 06/28/16 1330      Patient Demographics   Is this a new or existing patient? Existing   Patient is considered a/an Not Applicable   Race Caucasian/White     Patient Assistance   Location of Patient Assistance [place]   Patient's financial/insurance status Self-Pay (Uninsured)   Uninsured Patient ([facility]/[employer] Connects) No   Patient referred to apply for the following financial assistance Not Applicable   Food insecurities addressed Provided food supplies   Transportation assistance No   Assistance securing medications No   Educational health offerings Hypertension;Medications;Nutrition;Safety     Encounter Details   Was an Emergency Department visit averted? Not Applicable   Does patient have a medical provider? Yes   Patient referred to Follow up with established PCP   Was a mental health screening completed? (GAINS tool) No   Does patient have dental issues? No   Does patient have vision issues? No   Does your patient have an abnormal blood pressure today? Yes   Since previous encounter, have you referred patient for abnormal blood pressure that resulted in a new diagnosis or medication change? Yes   Does your patient have an abnormal blood glucose today? No   Since previous encounter, have you referred patient for abnormal blood glucose that resulted in a new diagnosis or medication change? Yes   Was there a life-saving intervention made? No    06/28/16 13:30 Hrs. Want B/P taken, B/P 143/81 Pulse 90. Taking meds as ordered.  Theron Arista, RN [phone]

## 2016-07-16 ENCOUNTER — Encounter: Payer: Self-pay | Admitting: Physician Assistant

## 2016-07-16 ENCOUNTER — Ambulatory Visit: Payer: Self-pay | Admitting: Physician Assistant

## 2016-07-16 VITALS — BP 123/77 | HR 74 | Temp 97.7°F | Ht 63.0 in | Wt 125.5 lb

## 2016-07-16 DIAGNOSIS — F419 Anxiety disorder, unspecified: Secondary | ICD-10-CM

## 2016-07-16 DIAGNOSIS — T22211A Burn of second degree of right forearm, initial encounter: Secondary | ICD-10-CM

## 2016-07-16 DIAGNOSIS — I10 Essential (primary) hypertension: Secondary | ICD-10-CM

## 2016-07-16 DIAGNOSIS — F17218 Nicotine dependence, cigarettes, with other nicotine-induced disorders: Secondary | ICD-10-CM

## 2016-07-16 MED ORDER — AMLODIPINE BESYLATE 10 MG PO TABS: 10.0000 mg | ORAL_TABLET | Freq: Every day | ORAL | 2 refills | Status: DC
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
## 2016-08-06 ENCOUNTER — Other Ambulatory Visit: Payer: Self-pay | Admitting: Physician Assistant

## 2016-08-06 MED ORDER — AMLODIPINE BESYLATE 10 MG PO TABS: 10.0000 mg | ORAL_TABLET | Freq: Every day | ORAL | 2 refills | Status: DC

## 2016-08-22 NOTE — Congregational Nurse Program (Unsigned)
Congregational Nurse Program Note  Date of Encounter: 08/22/2016 Past Medical History: Past Medical History:  Diagnosis Date  . Anxiety   . Hypertension   . Migraine     Encounter Details: B/P 110/69  Pulse 87. Sees Dr. On 07/16/16. Marland Kitchen   Continue to follow dr's instructions. Theron Arista, [phone].

## 2016-10-18 NOTE — Congregational Nurse Program (Unsigned)
Congregational Nurse Program Note  Date of Encounter: 10/17/2016  Past Medical History: Past Medical History:  Diagnosis Date  . Anxiety   . Hypertension   . Migraine     Encounter Details:     CNP Questionnaire - 10/17/16 2349      Patient Demographics   Is this a new or existing patient? Existing   Patient is considered a/an Not Applicable   Race African-American/Black     Patient Assistance   Location of Patient Assistance LOT [id number]   Patient's financial/insurance status Low Income   Uninsured Patient ([place] [facility]) No   Food insecurities addressed Provided food supplies   Transportation assistance No   Assistance securing medications No   Educational health offerings Nutrition;Hypertension     Encounter Details   Primary purpose of visit Safety;Post ED/Hospitalization Visit   Was an Emergency Department visit averted? Not Applicable   Does patient have a medical provider? Yes   Patient referred to Follow up with established PCP   Was a mental health screening completed? (GAINS tool) No   Does patient have dental issues? No   Does patient have vision issues? --  Starting to have eye problems   Does your patient have an abnormal blood pressure today? No   Since previous encounter, have you referred patient for abnormal blood pressure that resulted in a new diagnosis or medication change? No   Does your patient have an abnormal blood glucose today? No   Since previous encounter, have you referred patient for abnormal blood glucose that resulted in a new diagnosis or medication change? No   Was there a life-saving intervention made? No    B/P 105/68 Pulse 86. [facility] Continues to take Meds. As ordered. Theron Arista, [place] [phone]

## 2016-11-12 ENCOUNTER — Encounter: Payer: Self-pay | Admitting: Physician Assistant

## 2016-11-12 ENCOUNTER — Ambulatory Visit: Payer: Self-pay | Admitting: Physician Assistant

## 2016-11-12 VITALS — BP 120/80 | HR 85 | Temp 97.3°F | Ht 63.0 in | Wt 130.8 lb

## 2016-11-12 DIAGNOSIS — Z9889 Other specified postprocedural states: Secondary | ICD-10-CM

## 2016-11-12 DIAGNOSIS — F17218 Nicotine dependence, cigarettes, with other nicotine-induced disorders: Secondary | ICD-10-CM

## 2016-11-12 DIAGNOSIS — F419 Anxiety disorder, unspecified: Secondary | ICD-10-CM

## 2016-11-12 DIAGNOSIS — I10 Essential (primary) hypertension: Secondary | ICD-10-CM

## 2016-11-12 DIAGNOSIS — N63 Unspecified lump in unspecified breast: Secondary | ICD-10-CM

## 2016-11-12 NOTE — Progress Notes (Signed)
BP 120/80 (BP Location: Right Arm, Patient Position: Sitting, Cuff Size: Normal)   Pulse 85   Temp (!) 97.3 F (36.3 C)   Ht 5\' 3"  (1.6 m)   Wt 130 lb 12 oz (59.3 kg)   SpO2 98%   BMI 23.16 kg/m    Subjective:    Patient ID: Tammy Proctor, female    DOB: June 09, 1955, 61 y.o.   MRN: [id number]  HPI: Tammy Proctor is a 61 y.o. female presenting on 11/12/2016 for Hypertension and Follow-up (pt states she is now going to [facility])   HPI   Chief Complaint  Patient presents with  . Hypertension  . Follow-up    pt states she is now going to [place] states felt lump in R breast a few weeks ago.   She says she had aspiration done on that breast in the past that was benign.   Relevant past medical, surgical, family and social history reviewed and updated as indicated. Interim medical history since our last visit reviewed. Allergies and medications reviewed and updated.   Current Outpatient Prescriptions:  .  amLODipine (NORVASC) 10 MG tablet, Take 1 tablet (10 mg total) by mouth daily., Disp: 90 tablet, Rfl: 2   Review of Systems  Constitutional: Positive for chills and fatigue. Negative for appetite change, diaphoresis, fever and unexpected weight change.  HENT: Positive for congestion, dental problem and sneezing. Negative for drooling, ear pain, facial swelling, hearing loss, mouth sores, sore throat, trouble swallowing and voice change.   Eyes: Positive for pain, discharge, redness, itching and visual disturbance.  Respiratory: Positive for cough and shortness of breath. Negative for choking and wheezing.   Cardiovascular: Negative for chest pain, palpitations and leg swelling.  Gastrointestinal: Negative for abdominal pain, blood in stool, constipation, diarrhea and vomiting.  Endocrine: Positive for cold intolerance and heat intolerance. Negative for polydipsia.  Genitourinary: Negative for decreased urine volume, dysuria and hematuria.  Musculoskeletal:  Positive for arthralgias and back pain. Negative for gait problem.  Skin: Negative for rash.  Allergic/Immunologic: Positive for environmental allergies.  Neurological: Positive for headaches. Negative for seizures, syncope and light-headedness.  Hematological: Negative for adenopathy.  Psychiatric/Behavioral: Positive for agitation. Negative for dysphoric mood and suicidal ideas. The patient is nervous/anxious.     Per HPI unless specifically indicated above     Objective:    BP 120/80 (BP Location: Right Arm, Patient Position: Sitting, Cuff Size: Normal)   Pulse 85   Temp (!) 97.3 F (36.3 C)   Ht 5\' 3"  (1.6 m)   Wt 130 lb 12 oz (59.3 kg)   SpO2 98%   BMI 23.16 kg/m   Wt Readings from Last 3 Encounters:  11/12/16 130 lb 12 oz (59.3 kg)  07/16/16 125 lb 8 oz (56.9 kg)  05/14/16 122 lb 12 oz (55.7 kg)    Physical Exam  Constitutional: She is oriented to person, place, and time. She appears well-developed and well-nourished.  HENT:  Head: Normocephalic and atraumatic.  Neck: Neck supple.  Cardiovascular: Normal rate and regular rhythm.   Pulmonary/Chest: Effort normal and breath sounds normal.  Abdominal: Soft. Bowel sounds are normal. She exhibits no mass. There is no hepatosplenomegaly. There is no tenderness.  Genitourinary: No breast swelling, tenderness, discharge or bleeding.  Genitourinary Comments: There is vague mass Right breast at 3 oclock position.  No other abnormality.  (nurse Berenice assisted)  Musculoskeletal: She exhibits no edema.  Lymphadenopathy:    She has  no cervical adenopathy.  Neurological: She is alert and oriented to person, place, and time.  Skin: Skin is warm and dry.  Psychiatric: She has a normal mood and affect. Her behavior is normal.  Vitals reviewed.        Assessment & Plan:    Encounter Diagnoses  Name Primary?  . Essential hypertension, benign Yes  . Anxiety   . Cigarette nicotine dependence with other nicotine-induced  disorder   . Breast lump   . History of benign breast biopsy      -Check diagnostic mammogram -Check BMP today.  Will call pt with results -pt to continue with [facility] for [place] issues -no medication changes today -pt to follow up 4 months.  RTO sooner prn

## 2016-11-15 ENCOUNTER — Other Ambulatory Visit (HOSPITAL_COMMUNITY): Payer: Self-pay | Admitting: *Deleted

## 2016-11-15 ENCOUNTER — Other Ambulatory Visit (HOSPITAL_COMMUNITY)
Admission: RE | Admit: 2016-11-15 | Discharge: 2016-11-15 | Disposition: A | Discharge status: Home / Self Care | Payer: Self-pay | Source: Ambulatory Visit | Attending: Physician Assistant | Admitting: Physician Assistant

## 2016-11-15 DIAGNOSIS — R229 Localized swelling, mass and lump, unspecified: Principal | ICD-10-CM

## 2016-11-15 DIAGNOSIS — IMO0002 Reserved for concepts with insufficient information to code with codable children: Secondary | ICD-10-CM

## 2016-11-15 DIAGNOSIS — I10 Essential (primary) hypertension: Secondary | ICD-10-CM | POA: Insufficient documentation

## 2016-11-15 LAB — BASIC METABOLIC PANEL
ANION GAP: 10 (ref 5–15)
BUN: 22 mg/dL — ABNORMAL HIGH (ref 6–20)
CALCIUM: 9.5 mg/dL (ref 8.9–10.3)
CO2: 25 mmol/L (ref 22–32)
CREATININE: 1.11 mg/dL — AB (ref 0.44–1.00)
Chloride: 103 mmol/L (ref 101–111)
GFR, EST NON AFRICAN AMERICAN: 53 mL/min — AB (ref >60)
Glucose, Bld: 99 mg/dL (ref 65–99)
Potassium: 4.1 mmol/L (ref 3.5–5.1)
SODIUM: 138 mmol/L (ref 135–145)

## 2016-11-27 ENCOUNTER — Ambulatory Visit (HOSPITAL_COMMUNITY)
Admission: RE | Admit: 2016-11-27 | Discharge: 2016-11-27 | Disposition: A | Discharge status: Home / Self Care | Payer: PRIVATE HEALTH INSURANCE | Source: Ambulatory Visit | Attending: *Deleted | Admitting: *Deleted

## 2016-11-27 ENCOUNTER — Encounter (HOSPITAL_COMMUNITY): Payer: Self-pay

## 2016-11-27 DIAGNOSIS — N6310 Unspecified lump in the right breast, unspecified quadrant: Secondary | ICD-10-CM | POA: Diagnosis present

## 2016-11-27 DIAGNOSIS — R229 Localized swelling, mass and lump, unspecified: Principal | ICD-10-CM

## 2016-11-27 DIAGNOSIS — N6312 Unspecified lump in the right breast, upper inner quadrant: Secondary | ICD-10-CM | POA: Insufficient documentation

## 2016-11-27 DIAGNOSIS — IMO0002 Reserved for concepts with insufficient information to code with codable children: Secondary | ICD-10-CM

## 2016-11-27 DIAGNOSIS — R928 Other abnormal and inconclusive findings on diagnostic imaging of breast: Secondary | ICD-10-CM | POA: Insufficient documentation

## 2016-12-11 ENCOUNTER — Ambulatory Visit: Payer: PRIVATE HEALTH INSURANCE | Admitting: General Surgery

## 2016-12-12 ENCOUNTER — Other Ambulatory Visit: Payer: Self-pay | Admitting: Physician Assistant

## 2016-12-12 DIAGNOSIS — I10 Essential (primary) hypertension: Secondary | ICD-10-CM

## 2017-01-07 ENCOUNTER — Telehealth: Payer: Self-pay

## 2017-01-07 NOTE — Telephone Encounter (Signed)
Pt who at some point was connected with [facility] was called for follow-up today 01/07/17.  Left message to return phone call.  Will try to call patient at another time.   Chanon Loney R. [place], LPN [phone]

## 2017-01-07 NOTE — Telephone Encounter (Signed)
Pt who at some point was connected with [facility] was called for follow-up earlier today on 01/07/17. No answer. Message was left on voicemail.   Pt returned phone call few minutes later. After stating who I was and the reason for my call pt stated she was busy. Pt sounded annoyed. Pt was asked if she would like for me to call her at a later time. Pt suggested tomorrow. Pt did not voice a time.  Will call her back tomorrow 01/08/2017.   Aahna Rossa R. [place], LPN [phone]

## 2017-01-08 ENCOUNTER — Telehealth: Payer: Self-pay

## 2017-01-08 NOTE — Telephone Encounter (Signed)
Pt who at some point was connected with [facility] was called for follow-up on 01/07/17. No answer. Message was left on voicemail. Pt returned phone call few minutes later. After stating who I was and the reason for my call pt stated she was busy. Pt sounded annoyed. Pt suggested giving her a call back the next day 01/08/2017.   Pt was called today 01/08/17 to continue the follow-up. No answer. Message was left for patient to return call.     Faron Tudisco R. [place], LPN [phone]

## 2017-01-10 ENCOUNTER — Telehealth: Payer: Self-pay

## 2017-01-10 NOTE — Telephone Encounter (Signed)
Pt who at some point was connected with [facility] returned phone call from message left earlier today 01/10/17.  Gathered information from pt:  Pt stated she has no insurance and is going to the [facility], last appointment was 11/12/16 according to chart. Pt states is up to date with her HTN medication as well as her mental health medications. Pt states she is seeing [facility] for her depression, bipolar,and anxiety.   Pt states for transportation she walks and uses [employer].   PT states she is not working.  Pt states she still receiving food stamps and has no insecurity when it comes to food.  Pt says she has a permanent home and feels safe in her residence.    Pt stated she has already received the flu shot for this year.    Tammy Fricker R. [place], LPN [phone]

## 2017-01-10 NOTE — Telephone Encounter (Signed)
Pt who at some point was connected with [facility] was called a third time for follow-upon11/15/18. No answer. Message was left on voicemail.    Sonni Barse R. [place], LPN [phone]

## 2017-03-18 ENCOUNTER — Ambulatory Visit: Payer: Self-pay | Admitting: Physician Assistant

## 2017-03-18 ENCOUNTER — Encounter: Payer: Self-pay | Admitting: Physician Assistant

## 2017-03-18 VITALS — BP 146/64 | HR 96 | Temp 99.9°F | Ht 63.0 in | Wt 136.8 lb

## 2017-03-18 DIAGNOSIS — J019 Acute sinusitis, unspecified: Secondary | ICD-10-CM

## 2017-03-18 DIAGNOSIS — I10 Essential (primary) hypertension: Secondary | ICD-10-CM

## 2017-03-18 DIAGNOSIS — F39 Unspecified mood [affective] disorder: Secondary | ICD-10-CM

## 2017-03-18 DIAGNOSIS — F17218 Nicotine dependence, cigarettes, with other nicotine-induced disorders: Secondary | ICD-10-CM

## 2017-03-18 MED ORDER — AMOXICILLIN 500 MG PO CAPS: 500.0000 mg | ORAL_CAPSULE | Freq: Three times a day (TID) | ORAL | 0 refills | Status: DC

## 2017-03-18 MED ORDER — BENZONATATE 100 MG PO CAPS: ORAL_CAPSULE | ORAL | 2 refills | Status: DC

## 2017-03-18 NOTE — Progress Notes (Signed)
BP (!) 146/64 (BP Location: Right Arm, Patient Position: Sitting, Cuff Size: Normal)   Pulse 96   Temp 99.9 F (37.7 C)   Ht 5\' 3"  (1.6 m)   Wt 136 lb 12 oz (62 kg)   SpO2 99%   BMI 24.22 kg/m    Subjective:    Patient ID: Tammy Proctor, female    DOB: 04-08-1955, 62 y.o.   MRN: [id number]  HPI: Tammy Proctor is a 62 y.o. female presenting on 03/18/2017 for Hypertension   HPI  Pt didn't get labs drawn  Diagnostic mammogram/US done October normal  Pt is still going to [facility] for [facility] issues  Pt sick since Christmas. Cough, congestion, achey. Using OTC daytime/nytime cold meds.  Gets dizzy at times, mostly with bending over.  Complains of headache and sinus pressure  Pt still smoking.   Relevant past medical, surgical, family and social history reviewed and updated as indicated. Interim medical history since our last visit reviewed. Allergies and medications reviewed and updated.   Current Outpatient Medications:  .  amLODipine (NORVASC) 10 MG tablet, Take 1 tablet (10 mg total) by mouth daily., Disp: 90 tablet, Rfl: 2  Review of Systems  Constitutional: Positive for chills and fatigue. Negative for appetite change, diaphoresis, fever and unexpected weight change.  HENT: Positive for congestion and sneezing. Negative for dental problem, drooling, ear pain, facial swelling, hearing loss, mouth sores, sore throat, trouble swallowing and voice change.   Eyes: Positive for redness and itching. Negative for pain, discharge and visual disturbance.  Respiratory: Positive for cough and shortness of breath. Negative for choking and wheezing.   Cardiovascular: Negative for chest pain, palpitations and leg swelling.  Gastrointestinal: Negative for abdominal pain, blood in stool, constipation, diarrhea and vomiting.  Endocrine: Positive for cold intolerance. Negative for heat intolerance and polydipsia.  Genitourinary: Negative for decreased urine volume, dysuria and  hematuria.  Musculoskeletal: Positive for arthralgias and back pain. Negative for gait problem.  Skin: Negative for rash.  Allergic/Immunologic: Positive for environmental allergies.  Neurological: Positive for headaches. Negative for seizures, syncope and light-headedness.  Hematological: Negative for adenopathy.  Psychiatric/Behavioral: Positive for agitation. Negative for dysphoric mood and suicidal ideas. The patient is nervous/anxious.     Per HPI unless specifically indicated above     Objective:    BP (!) 146/64 (BP Location: Right Arm, Patient Position: Sitting, Cuff Size: Normal)   Pulse 96   Temp 99.9 F (37.7 C)   Ht 5\' 3"  (1.6 m)   Wt 136 lb 12 oz (62 kg)   SpO2 99%   BMI 24.22 kg/m   Wt Readings from Last 3 Encounters:  03/18/17 136 lb 12 oz (62 kg)  11/12/16 130 lb 12 oz (59.3 kg)  07/16/16 125 lb 8 oz (56.9 kg)    Physical Exam  Constitutional: She is oriented to person, place, and time. She appears well-developed and well-nourished.  HENT:  Head: Normocephalic and atraumatic.  Right Ear: Hearing, tympanic membrane, external ear and ear canal normal.  Left Ear: Hearing, tympanic membrane, external ear and ear canal normal.  Nose: Nose normal.  Mouth/Throat: Uvula is midline and oropharynx is clear and moist. No oropharyngeal exudate.  Neck: Neck supple.  Cardiovascular: Normal rate and regular rhythm.  Pulmonary/Chest: Effort normal and breath sounds normal. She has no wheezes.  Abdominal: Soft. Bowel sounds are normal. She exhibits no mass. There is no hepatosplenomegaly. There is no tenderness.  Musculoskeletal: She exhibits no edema.  Lymphadenopathy:    She has no cervical adenopathy.  Neurological: She is alert and oriented to person, place, and time.  Skin: Skin is warm and dry.  Psychiatric: She has a normal mood and affect. Her behavior is normal.  Vitals reviewed.       Assessment & Plan:   Encounter Diagnoses  Name Primary?  . Essential  hypertension, benign Yes  . Cigarette nicotine dependence with other nicotine-induced disorder   . Mood disorder ([place])   . Acute sinusitis, recurrence not specified, unspecified location      -pt to Get labs drawn to recheck Cr -pt to Continue current amlodipine -rx Amoxil for sinusitis and gave rx for tessalom -counseled Smoking cessation -pt to follow up to recheck bp 1 month.  RTO sooner prn

## 2017-03-27 ENCOUNTER — Other Ambulatory Visit (HOSPITAL_COMMUNITY)
Admission: RE | Admit: 2017-03-27 | Discharge: 2017-03-27 | Disposition: A | Discharge status: Home / Self Care | Payer: PRIVATE HEALTH INSURANCE | Source: Ambulatory Visit | Attending: Physician Assistant | Admitting: Physician Assistant

## 2017-03-27 DIAGNOSIS — I10 Essential (primary) hypertension: Secondary | ICD-10-CM

## 2017-03-27 LAB — BASIC METABOLIC PANEL
Anion gap: 11 (ref 5–15)
BUN: 15 mg/dL (ref 6–20)
CHLORIDE: 106 mmol/L (ref 101–111)
CO2: 20 mmol/L — AB (ref 22–32)
CREATININE: 0.87 mg/dL (ref 0.44–1.00)
Calcium: 9.4 mg/dL (ref 8.9–10.3)
GFR calc Af Amer: >60 mL/min (ref >60)
GFR calc non Af Amer: >60 mL/min (ref >60)
Glucose, Bld: 70 mg/dL (ref 65–99)
Potassium: 3.5 mmol/L (ref 3.5–5.1)
SODIUM: 137 mmol/L (ref 135–145)

## 2017-04-15 ENCOUNTER — Ambulatory Visit: Payer: Self-pay | Admitting: Physician Assistant

## 2017-04-15 ENCOUNTER — Encounter: Payer: Self-pay | Admitting: Physician Assistant

## 2017-04-15 VITALS — BP 122/76 | HR 73 | Temp 97.0°F | Ht 63.0 in | Wt 138.8 lb

## 2017-04-15 DIAGNOSIS — I10 Essential (primary) hypertension: Secondary | ICD-10-CM

## 2017-04-15 DIAGNOSIS — R109 Unspecified abdominal pain: Secondary | ICD-10-CM

## 2017-04-15 DIAGNOSIS — F419 Anxiety disorder, unspecified: Secondary | ICD-10-CM

## 2017-04-15 DIAGNOSIS — J00 Acute nasopharyngitis [common cold]: Secondary | ICD-10-CM

## 2017-04-15 DIAGNOSIS — F17218 Nicotine dependence, cigarettes, with other nicotine-induced disorders: Secondary | ICD-10-CM

## 2017-04-15 LAB — POCT URINALYSIS DIPSTICK
Bilirubin, UA: NEGATIVE
Blood, UA: NEGATIVE
Glucose, UA: NEGATIVE
KETONES UA: NEGATIVE
LEUKOCYTES UA: NEGATIVE
NITRITE UA: NEGATIVE
PH UA: 7 (ref 5.0–8.0)
PROTEIN UA: NEGATIVE
UROBILINOGEN UA: 0.2 U/dL

## 2017-04-15 NOTE — Progress Notes (Signed)
BP 122/76 (BP Location: Left Arm, Patient Position: Sitting, Cuff Size: Normal)   Pulse 73   Temp (!) 97 F (36.1 C)   Ht 5\' 3"  (1.6 m)   Wt 138 lb 12 oz (62.9 kg)   SpO2 98%   BMI 24.58 kg/m    Subjective:    Patient ID: Tammy Proctor, female    DOB: [birth year], 62 y.o.   MRN: [id number]  HPI: Tammy Proctor is a 62 y.o. female presenting on 04/15/2017 for Hypertension   HPI   Pt states a little SOB, some night time wheezing, congestion, HA. pt states she was given amoxicillin given to her at last OV but says she is now sick again.  Her son is sick today as well.   Pt also thinks she has UTI because her lower back hurts  Pt is still smoking  Relevant past medical, surgical, family and social history reviewed and updated as indicated. Interim medical history since our last visit reviewed. Allergies and medications reviewed and updated.   Current Outpatient Medications:  .  amLODipine (NORVASC) 10 MG tablet, Take 1 tablet (10 mg total) by mouth daily., Disp: 90 tablet, Rfl: 2 .  benzonatate (TESSALON PERLES) 100 MG capsule, 1 - 2 po q 8 hour prn cough, Disp: 20 capsule, Rfl: 2   Review of Systems  Constitutional: Positive for chills and fatigue. Negative for appetite change, diaphoresis, fever and unexpected weight change.  HENT: Positive for congestion, dental problem, drooling, ear pain, sneezing and sore throat. Negative for facial swelling, hearing loss, mouth sores, trouble swallowing and voice change.   Eyes: Positive for pain, redness, itching and visual disturbance. Negative for discharge.  Respiratory: Positive for cough and shortness of breath. Negative for choking and wheezing.   Cardiovascular: Negative for chest pain, palpitations and leg swelling.  Gastrointestinal: Negative for abdominal pain, blood in stool, constipation, diarrhea and vomiting.  Endocrine: Positive for cold intolerance and heat intolerance. Negative for polydipsia.  Genitourinary:  Negative for decreased urine volume, dysuria and hematuria.  Musculoskeletal: Positive for arthralgias and back pain. Negative for gait problem.  Skin: Negative for rash.  Allergic/Immunologic: Positive for environmental allergies.  Neurological: Positive for light-headedness and headaches. Negative for seizures and syncope.  Hematological: Negative for adenopathy.  Psychiatric/Behavioral: Positive for agitation. Negative for dysphoric mood and suicidal ideas. The patient is nervous/anxious.     Per HPI unless specifically indicated above     Objective:    BP 122/76 (BP Location: Left Arm, Patient Position: Sitting, Cuff Size: Normal)   Pulse 73   Temp (!) 97 F (36.1 C)   Ht 5\' 3"  (1.6 m)   Wt 138 lb 12 oz (62.9 kg)   SpO2 98%   BMI 24.58 kg/m   Wt Readings from Last 3 Encounters:  04/15/17 138 lb 12 oz (62.9 kg)  03/18/17 136 lb 12 oz (62 kg)  11/12/16 130 lb 12 oz (59.3 kg)    Physical Exam  Constitutional: She is oriented to person, place, and time. She appears well-developed and well-nourished.  HENT:  Head: Normocephalic and atraumatic.  Right Ear: Hearing, tympanic membrane, external ear and ear canal normal.  Left Ear: Hearing, tympanic membrane, external ear and ear canal normal.  Nose: Nose normal.  Mouth/Throat: Uvula is midline and oropharynx is clear and moist. No oropharyngeal exudate.  Neck: Neck supple.  Cardiovascular: Normal rate and regular rhythm.  Pulmonary/Chest: Effort normal and breath sounds normal. No accessory muscle usage. No tachypnea  and no bradypnea. No respiratory distress. She has no wheezes. She has no rhonchi. She has no rales.  Abdominal: Soft. Bowel sounds are normal. She exhibits no mass. There is no hepatosplenomegaly. There is no tenderness. There is no CVA tenderness.  Musculoskeletal: She exhibits no edema.  Lymphadenopathy:    She has no cervical adenopathy.  Neurological: She is alert and oriented to person, place, and time.   Skin: Skin is warm and dry.  Psychiatric: She has a normal mood and affect. Her behavior is normal.  Vitals reviewed.   Results for orders placed or performed during the hospital encounter of 84/66/59  Basic Metabolic Panel (BMET)  Result Value Ref Range   Sodium 137 135 - 145 mmol/L   Potassium 3.5 3.5 - 5.1 mmol/L   Chloride 106 101 - 111 mmol/L   CO2 20 (L) 22 - 32 mmol/L   Glucose, Bld 70 65 - 99 mg/dL   BUN 15 6 - 20 mg/dL   Creatinine, Ser 0.87 0.44 - 1.00 mg/dL   Calcium 9.4 8.9 - 10.3 mg/dL   GFR calc non Af Amer >60 >60 mL/min   GFR calc Af Amer >60 >60 mL/min   Anion gap 11 5 - 15      Assessment & Plan:   Encounter Diagnoses  Name Primary?  . Essential hypertension, benign Yes  . Flank pain   . Acute nasopharyngitis   . Cigarette nicotine dependence with other nicotine-induced disorder   . Anxiety     -reviewed labs with pt -pt to continue amlodipine for htn -counseled pt on need to stop smoking to help her breathing and reduce illnesses -pt counseled on rest, fluids, no smoking for URI.  She has tessalon to use as well -pt to follow up in 3 months.  RTO sooner prn

## 2017-05-06 ENCOUNTER — Other Ambulatory Visit: Payer: Self-pay | Admitting: Physician Assistant

## 2017-07-15 ENCOUNTER — Encounter: Payer: Self-pay | Admitting: Physician Assistant

## 2017-07-15 ENCOUNTER — Ambulatory Visit: Payer: Self-pay | Admitting: Physician Assistant

## 2017-07-15 VITALS — BP 109/69 | HR 66 | Temp 97.3°F | Ht 63.0 in | Wt 141.0 lb

## 2017-07-15 DIAGNOSIS — F39 Unspecified mood [affective] disorder: Secondary | ICD-10-CM

## 2017-07-15 DIAGNOSIS — I10 Essential (primary) hypertension: Secondary | ICD-10-CM

## 2017-07-15 DIAGNOSIS — Z13220 Encounter for screening for lipoid disorders: Secondary | ICD-10-CM

## 2017-07-15 DIAGNOSIS — H6692 Otitis media, unspecified, left ear: Secondary | ICD-10-CM

## 2017-07-15 DIAGNOSIS — F17218 Nicotine dependence, cigarettes, with other nicotine-induced disorders: Secondary | ICD-10-CM

## 2017-07-15 MED ORDER — AMOXICILLIN 500 MG PO CAPS: 500.0000 mg | ORAL_CAPSULE | Freq: Three times a day (TID) | ORAL | 0 refills | Status: DC

## 2017-07-15 NOTE — Progress Notes (Signed)
BP 109/69 (BP Location: Left Arm, Patient Position: Sitting, Cuff Size: Normal)   Pulse 66   Temp (!) 97.3 F (36.3 C)   Ht 5\' 3"  (1.6 m)   Wt 141 lb (64 kg)   SpO2 96%   BMI 24.98 kg/m    Subjective:    Patient ID: Tammy Proctor, female    DOB: 01-22-1956, 62 y.o.   MRN: [id number]  HPI: Tammy Proctor is a 62 y.o. female presenting on 07/15/2017 for Hypertension and Ear Pain (for 4-5 days.. in L ear. pt states she felt a bump abd has been scrathing)   HPI   Chief Complaint  Patient presents with  . Hypertension  . Ear Pain    for 4-5 days.. in L ear. pt states she felt a bump abd has been scrathing     Relevant past medical, surgical, family and social history reviewed and updated as indicated. Interim medical history since our last visit reviewed. Allergies and medications reviewed and updated.   Current Outpatient Medications:  .  amLODipine (NORVASC) 10 MG tablet, TAKE 1 Tablet BY MOUTH ONCE DAILY, Disp: 90 tablet, Rfl: 2 .  OVER THE COUNTER MEDICATION, Take 2 tablets by mouth as needed (arthitis pain)., Disp: , Rfl:    Review of Systems  Constitutional: Positive for fatigue. Negative for appetite change, chills, diaphoresis, fever and unexpected weight change.  HENT: Positive for dental problem and ear pain. Negative for congestion, drooling, facial swelling, hearing loss, mouth sores, sneezing, sore throat, trouble swallowing and voice change.   Eyes: Positive for pain, discharge, redness, itching and visual disturbance.       Eye issues only sometimes, not presently  Respiratory: Negative for cough, choking, shortness of breath and wheezing.   Cardiovascular: Negative for chest pain, palpitations and leg swelling.  Gastrointestinal: Negative for abdominal pain, blood in stool, constipation, diarrhea and vomiting.  Endocrine: Negative for cold intolerance, heat intolerance and polydipsia.  Genitourinary: Negative for decreased urine volume, dysuria and  hematuria.  Musculoskeletal: Positive for arthralgias and back pain. Negative for gait problem.  Skin: Negative for rash.  Allergic/Immunologic: Positive for environmental allergies.  Neurological: Positive for headaches. Negative for seizures, syncope and light-headedness.  Hematological: Negative for adenopathy.  Psychiatric/Behavioral: Positive for agitation. Negative for dysphoric mood and suicidal ideas. The patient is nervous/anxious.     Per HPI unless specifically indicated above     Objective:    BP 109/69 (BP Location: Left Arm, Patient Position: Sitting, Cuff Size: Normal)   Pulse 66   Temp (!) 97.3 F (36.3 C)   Ht 5\' 3"  (1.6 m)   Wt 141 lb (64 kg)   SpO2 96%   BMI 24.98 kg/m   Wt Readings from Last 3 Encounters:  07/15/17 141 lb (64 kg)  04/15/17 138 lb 12 oz (62.9 kg)  03/18/17 136 lb 12 oz (62 kg)    Physical Exam  Constitutional: She is oriented to person, place, and time. She appears well-developed and well-nourished.  HENT:  Head: Normocephalic and atraumatic.  Right Ear: Tympanic membrane, external ear and ear canal normal.  Left Ear: External ear and ear canal normal. Tympanic membrane is injected and erythematous.  Neck: Neck supple.  Cardiovascular: Normal rate and regular rhythm.  Pulmonary/Chest: Effort normal and breath sounds normal.  Abdominal: Soft. Bowel sounds are normal. She exhibits no mass. There is no hepatosplenomegaly. There is no tenderness.  Musculoskeletal: She exhibits no edema.  Lymphadenopathy:    She has  no cervical adenopathy.  Neurological: She is alert and oriented to person, place, and time.  Skin: Skin is warm and dry.  Psychiatric: She has a normal mood and affect. Her behavior is normal.  Vitals reviewed.     Assessment & Plan:   Encounter Diagnoses  Name Primary?  . Essential hypertension, benign Yes  . Screening cholesterol level   . Left otitis media, unspecified otitis media type   . Cigarette nicotine  dependence with other nicotine-induced disorder   . Mood disorder ([place])      -will Check lipids and cmp and call pt with results. -will Check old paper chart for colonoscopy report- done 2013 -rx amoxil for ear infection.  Pt says she does well on amoxil (and is not allergic to it) -pt to follow up 4 months.  RTO sooner prn

## 2017-07-18 ENCOUNTER — Other Ambulatory Visit: Payer: Self-pay | Admitting: Physician Assistant

## 2017-07-18 ENCOUNTER — Other Ambulatory Visit (HOSPITAL_COMMUNITY)
Admission: RE | Admit: 2017-07-18 | Discharge: 2017-07-18 | Disposition: A | Discharge status: Home / Self Care | Payer: PRIVATE HEALTH INSURANCE | Source: Ambulatory Visit | Attending: Physician Assistant | Admitting: Physician Assistant

## 2017-07-18 DIAGNOSIS — I10 Essential (primary) hypertension: Secondary | ICD-10-CM

## 2017-07-18 DIAGNOSIS — Z13220 Encounter for screening for lipoid disorders: Secondary | ICD-10-CM

## 2017-07-18 DIAGNOSIS — E785 Hyperlipidemia, unspecified: Secondary | ICD-10-CM

## 2017-07-18 LAB — COMPREHENSIVE METABOLIC PANEL
ALBUMIN: 4.5 g/dL (ref 3.5–5.0)
ALK PHOS: 71 U/L (ref 38–126)
ALT: 14 U/L (ref 14–54)
AST: 20 U/L (ref 15–41)
Anion gap: 9 (ref 5–15)
BILIRUBIN TOTAL: 1.1 mg/dL (ref 0.3–1.2)
BUN: 15 mg/dL (ref 6–20)
CALCIUM: 9.7 mg/dL (ref 8.9–10.3)
CO2: 24 mmol/L (ref 22–32)
Chloride: 105 mmol/L (ref 101–111)
Creatinine, Ser: 0.89 mg/dL (ref 0.44–1.00)
GFR calc Af Amer: >60 mL/min (ref >60)
GLUCOSE: 103 mg/dL — AB (ref 65–99)
POTASSIUM: 3.9 mmol/L (ref 3.5–5.1)
Sodium: 138 mmol/L (ref 135–145)
TOTAL PROTEIN: 7.4 g/dL (ref 6.5–8.1)

## 2017-07-18 LAB — LIPID PANEL
CHOL/HDL RATIO: 3 ratio
CHOLESTEROL: 243 mg/dL — AB (ref 0–200)
HDL: 81 mg/dL (ref >40)
LDL Cholesterol: 143 mg/dL — ABNORMAL HIGH (ref 0–99)
Triglycerides: 97 mg/dL (ref <150)
VLDL: 19 mg/dL (ref 0–40)

## 2017-08-19 ENCOUNTER — Other Ambulatory Visit: Payer: Self-pay | Admitting: Physician Assistant

## 2017-08-19 MED ORDER — AMLODIPINE BESYLATE 10 MG PO TABS: 10.0000 mg | ORAL_TABLET | Freq: Every day | ORAL | 2 refills | Status: DC

## 2017-08-20 ENCOUNTER — Telehealth: Payer: Self-pay | Admitting: Student

## 2017-08-20 NOTE — Telephone Encounter (Signed)
-----   Message from Soyla Dryer, [place] sent at 08/19/2017  9:35 PM EDT ----- Regarding: RE: new prescription done ----- Message ----- From: Jinny Blossom, LPN Sent: 9/50/9326   2:47 PM To: Soyla Dryer, PA-C Subject: new prescription                               Pt [facility] ran out and is working on getting everything turned in. She has run out of amlodipine, could you send a new rx for amlodipine to [facility] in [place] for pt to have until she gets re-enrolled with [facility].  Thanks and sorry I keep sending you messages, hope you're enjoying your time off.

## 2017-11-04 ENCOUNTER — Other Ambulatory Visit (HOSPITAL_COMMUNITY)
Admission: RE | Admit: 2017-11-04 | Discharge: 2017-11-04 | Disposition: A | Discharge status: Home / Self Care | Payer: Self-pay | Source: Ambulatory Visit | Attending: Physician Assistant | Admitting: Physician Assistant

## 2017-11-04 DIAGNOSIS — E785 Hyperlipidemia, unspecified: Secondary | ICD-10-CM | POA: Insufficient documentation

## 2017-11-04 DIAGNOSIS — I10 Essential (primary) hypertension: Secondary | ICD-10-CM | POA: Insufficient documentation

## 2017-11-04 LAB — LIPID PANEL
CHOLESTEROL: 257 mg/dL — AB (ref 0–200)
HDL: 75 mg/dL (ref >40)
LDL Cholesterol: 155 mg/dL — ABNORMAL HIGH (ref 0–99)
TRIGLYCERIDES: 136 mg/dL (ref <150)
Total CHOL/HDL Ratio: 3.4 RATIO
VLDL: 27 mg/dL (ref 0–40)

## 2017-11-04 LAB — COMPREHENSIVE METABOLIC PANEL
ALK PHOS: 67 U/L (ref 38–126)
ALT: 15 U/L (ref 0–44)
AST: 21 U/L (ref 15–41)
Albumin: 4.3 g/dL (ref 3.5–5.0)
Anion gap: 10 (ref 5–15)
BUN: 11 mg/dL (ref 8–23)
CALCIUM: 9.5 mg/dL (ref 8.9–10.3)
CHLORIDE: 103 mmol/L (ref 98–111)
CO2: 23 mmol/L (ref 22–32)
Creatinine, Ser: 0.9 mg/dL (ref 0.44–1.00)
Glucose, Bld: 96 mg/dL (ref 70–99)
Potassium: 4 mmol/L (ref 3.5–5.1)
Sodium: 136 mmol/L (ref 135–145)
TOTAL PROTEIN: 7.2 g/dL (ref 6.5–8.1)
Total Bilirubin: 1 mg/dL (ref 0.3–1.2)

## 2017-11-11 ENCOUNTER — Ambulatory Visit: Payer: Self-pay | Admitting: Physician Assistant

## 2017-11-11 ENCOUNTER — Encounter: Payer: Self-pay | Admitting: Physician Assistant

## 2017-11-11 VITALS — BP 111/76 | HR 73 | Temp 97.8°F | Ht 63.0 in | Wt 149.0 lb

## 2017-11-11 DIAGNOSIS — I10 Essential (primary) hypertension: Secondary | ICD-10-CM

## 2017-11-11 DIAGNOSIS — E785 Hyperlipidemia, unspecified: Secondary | ICD-10-CM

## 2017-11-11 MED ORDER — AMLODIPINE BESYLATE 10 MG PO TABS: 10.0000 mg | ORAL_TABLET | Freq: Every day | ORAL | 2 refills | Status: DC

## 2017-11-11 MED ORDER — ATORVASTATIN CALCIUM 20 MG PO TABS: 20.0000 mg | ORAL_TABLET | Freq: Every day | ORAL | 2 refills | Status: DC

## 2017-11-11 NOTE — Progress Notes (Signed)
BP 111/76   Pulse 73   Temp 97.8 F (36.6 C)   Ht 5\' 3"  (1.6 m)   Wt 149 lb (67.6 kg)   SpO2 100%   BMI 26.39 kg/m    Subjective:    Patient ID: Tammy Proctor, female    DOB: 05/31/1955, 62 y.o.   MRN: [id number]  HPI: Tammy Proctor is a 62 y.o. female presenting on 11/11/2017 for Hypertension   HPI   Pt is doing well and has no complaints  Relevant past medical, surgical, family and social history reviewed and updated as indicated. Interim medical history since our last visit reviewed. Allergies and medications reviewed and updated.   Current Outpatient Medications:  .  amLODipine (NORVASC) 10 MG tablet, Take 1 tablet (10 mg total) by mouth daily., Disp: 30 tablet, Rfl: 2 .  OVER THE COUNTER MEDICATION, Take 2 tablets by mouth as needed (arthitis pain)., Disp: , Rfl:    Review of Systems  Constitutional: Negative for appetite change, chills, diaphoresis, fatigue, fever and unexpected weight change.  HENT: Negative for congestion, dental problem, drooling, ear pain, facial swelling, hearing loss, mouth sores, sneezing, sore throat, trouble swallowing and voice change.   Eyes: Negative for pain, discharge, redness, itching and visual disturbance.  Respiratory: Negative for cough, choking, shortness of breath and wheezing.   Cardiovascular: Negative for chest pain, palpitations and leg swelling.  Gastrointestinal: Negative for abdominal pain, blood in stool, constipation, diarrhea and vomiting.  Endocrine: Negative for cold intolerance, heat intolerance and polydipsia.  Genitourinary: Negative for decreased urine volume, dysuria and hematuria.  Musculoskeletal: Negative for arthralgias, back pain and gait problem.  Skin: Negative for rash.  Allergic/Immunologic: Negative for environmental allergies.  Neurological: Negative for seizures, syncope, light-headedness and headaches.  Hematological: Negative for adenopathy.  Psychiatric/Behavioral: Negative for  agitation, dysphoric mood and suicidal ideas. The patient is not nervous/anxious.     Per HPI unless specifically indicated above     Objective:    BP 111/76   Pulse 73   Temp 97.8 F (36.6 C)   Ht 5\' 3"  (1.6 m)   Wt 149 lb (67.6 kg)   SpO2 100%   BMI 26.39 kg/m   Wt Readings from Last 3 Encounters:  11/11/17 149 lb (67.6 kg)  07/15/17 141 lb (64 kg)  04/15/17 138 lb 12 oz (62.9 kg)    Physical Exam  Constitutional: She is oriented to person, place, and time. She appears well-developed and well-nourished.  HENT:  Head: Normocephalic and atraumatic.  Neck: Neck supple.  Cardiovascular: Normal rate and regular rhythm.  Pulmonary/Chest: Effort normal and breath sounds normal.  Abdominal: Soft. Bowel sounds are normal. She exhibits no mass. There is no hepatosplenomegaly. There is no tenderness.  Musculoskeletal: She exhibits no edema.  Lymphadenopathy:    She has no cervical adenopathy.  Neurological: She is alert and oriented to person, place, and time.  Skin: Skin is warm and dry.  Psychiatric: She has a normal mood and affect. Her behavior is normal.  Vitals reviewed.   Results for orders placed or performed during the hospital encounter of 11/04/17  Comprehensive metabolic panel  Result Value Ref Range   Sodium 136 135 - 145 mmol/L   Potassium 4.0 3.5 - 5.1 mmol/L   Chloride 103 98 - 111 mmol/L   CO2 23 22 - 32 mmol/L   Glucose, Bld 96 70 - 99 mg/dL   BUN 11 8 - 23 mg/dL   Creatinine, Ser 0.90 0.44 -  1.00 mg/dL   Calcium 9.5 8.9 - 10.3 mg/dL   Total Protein 7.2 6.5 - 8.1 g/dL   Albumin 4.3 3.5 - 5.0 g/dL   AST 21 15 - 41 U/L   ALT 15 0 - 44 U/L   Alkaline Phosphatase 67 38 - 126 U/L   Total Bilirubin 1.0 0.3 - 1.2 mg/dL   GFR calc non Af Amer >60 >60 mL/min   GFR calc Af Amer >60 >60 mL/min   Anion gap 10 5 - 15  Lipid panel  Result Value Ref Range   Cholesterol 257 (H) 0 - 200 mg/dL   Triglycerides 136 <150 mg/dL   HDL 75 >40 mg/dL   Total CHOL/HDL  Ratio 3.4 RATIO   VLDL 27 0 - 40 mg/dL   LDL Cholesterol 155 (H) 0 - 99 mg/dL      Assessment & Plan:   Encounter Diagnoses  Name Primary?  . Essential hypertension, benign Yes  . Hyperlipidemia, unspecified hyperlipidemia type      -reviewed labs with pt -rx atorvastatin for lipids and counseled on lowfat diet -pt to continue current medications for HTN -pt to follow up 3 months.  RTO sooner prn

## 2017-11-11 NOTE — Patient Instructions (Signed)

## 2017-12-26 ENCOUNTER — Telehealth (HOSPITAL_COMMUNITY): Payer: Self-pay | Admitting: Obstetrics and Gynecology

## 2017-12-26 NOTE — Telephone Encounter (Signed)
Left voice mail regarding scheduling screening mammogram.

## 2018-01-17 ENCOUNTER — Other Ambulatory Visit (HOSPITAL_COMMUNITY): Payer: Self-pay | Admitting: *Deleted

## 2018-01-17 DIAGNOSIS — Z1231 Encounter for screening mammogram for malignant neoplasm of breast: Secondary | ICD-10-CM

## 2018-02-10 ENCOUNTER — Encounter: Payer: Self-pay | Admitting: Physician Assistant

## 2018-02-10 ENCOUNTER — Ambulatory Visit: Payer: Self-pay | Admitting: Physician Assistant

## 2018-02-10 VITALS — BP 134/78 | HR 80 | Temp 97.3°F | Ht 63.0 in | Wt 152.0 lb

## 2018-02-10 DIAGNOSIS — E785 Hyperlipidemia, unspecified: Secondary | ICD-10-CM

## 2018-02-10 DIAGNOSIS — F419 Anxiety disorder, unspecified: Secondary | ICD-10-CM

## 2018-02-10 DIAGNOSIS — I10 Essential (primary) hypertension: Secondary | ICD-10-CM

## 2018-02-10 DIAGNOSIS — F17200 Nicotine dependence, unspecified, uncomplicated: Secondary | ICD-10-CM

## 2018-02-10 MED ORDER — AMLODIPINE BESYLATE 10 MG PO TABS: 10.0000 mg | ORAL_TABLET | Freq: Every day | ORAL | 3 refills | Status: DC

## 2018-02-10 NOTE — Progress Notes (Signed)
BP 134/78 (BP Location: Right Arm, Patient Position: Sitting, Cuff Size: Normal)   Pulse 80   Temp (!) 97.3 F (36.3 C)   Ht 5\' 3"  (1.6 m)   Wt 152 lb (68.9 kg)   SpO2 99%   BMI 26.93 kg/m    Subjective:    Patient ID: Tammy Proctor, female    DOB: 03/05/1955, 62 y.o.   MRN: [id number]  HPI: Tammy Proctor is a 62 y.o. female presenting on 02/10/2018 for Hypertension and Hyperlipidemia   HPI   Pt did not get labs drawn.  Pt has not yet started the atorvastatin.  She is having a problem with [facility] and has not gotten her medication.  She is still smoking some.    Relevant past medical, surgical, family and social history reviewed and updated as indicated. Interim medical history since our last visit reviewed. Allergies and medications reviewed and updated.   Current Outpatient Medications:  .  amLODipine (NORVASC) 10 MG tablet, Take 1 tablet (10 mg total) by mouth daily., Disp: 30 tablet, Rfl: 2 .  OVER THE COUNTER MEDICATION, Take 2 tablets by mouth as needed (arthitis pain)., Disp: , Rfl:  .  atorvastatin (LIPITOR) 20 MG tablet, Take 1 tablet (20 mg total) by mouth daily. (Patient not taking: Reported on 02/10/2018), Disp: 90 tablet, Rfl: 2    Review of Systems  Constitutional: Positive for chills and fatigue. Negative for appetite change, diaphoresis, fever and unexpected weight change.  HENT: Positive for sneezing. Negative for congestion, dental problem, drooling, ear pain, facial swelling, hearing loss, mouth sores, sore throat, trouble swallowing and voice change.   Eyes: Positive for redness, itching and visual disturbance. Negative for pain and discharge.  Respiratory: Positive for cough. Negative for choking, shortness of breath and wheezing.   Cardiovascular: Negative for chest pain, palpitations and leg swelling.  Gastrointestinal: Negative for abdominal pain, blood in stool, constipation, diarrhea and vomiting.  Endocrine: Positive for heat  intolerance. Negative for cold intolerance and polydipsia.  Genitourinary: Negative for decreased urine volume, dysuria and hematuria.  Musculoskeletal: Negative for arthralgias, back pain and gait problem.  Skin: Negative for rash.  Allergic/Immunologic: Positive for environmental allergies.  Neurological: Positive for headaches. Negative for seizures, syncope and light-headedness.  Hematological: Negative for adenopathy.  Psychiatric/Behavioral: Positive for agitation and dysphoric mood. Negative for suicidal ideas. The patient is nervous/anxious.     Per HPI unless specifically indicated above     Objective:    BP 134/78 (BP Location: Right Arm, Patient Position: Sitting, Cuff Size: Normal)   Pulse 80   Temp (!) 97.3 F (36.3 C)   Ht 5\' 3"  (1.6 m)   Wt 152 lb (68.9 kg)   SpO2 99%   BMI 26.93 kg/m   Wt Readings from Last 3 Encounters:  02/10/18 152 lb (68.9 kg)  11/11/17 149 lb (67.6 kg)  07/15/17 141 lb (64 kg)    Physical Exam Vitals signs reviewed.  Constitutional:      Appearance: She is well-developed.  HENT:     Head: Normocephalic and atraumatic.  Neck:     Musculoskeletal: Neck supple.  Cardiovascular:     Rate and Rhythm: Normal rate and regular rhythm.  Pulmonary:     Effort: Pulmonary effort is normal.     Breath sounds: Normal breath sounds.  Abdominal:     General: Bowel sounds are normal.     Palpations: Abdomen is soft. There is no mass.     Tenderness: There  is no abdominal tenderness.  Lymphadenopathy:     Cervical: No cervical adenopathy.  Skin:    General: Skin is warm and dry.  Neurological:     Mental Status: She is alert and oriented to person, place, and time.  Psychiatric:        Behavior: Behavior normal.         Assessment & Plan:   Encounter Diagnoses  Name Primary?  . Essential hypertension, benign Yes  . Hyperlipidemia, unspecified hyperlipidemia type   . Tobacco use disorder   . Anxiety     -nurse Will check on  medassist so pt can get on her atorvastatin -Pt to get fasting labs drawn -Pt has appt for mammogram next month -counseled smoking cesstation -pt to Follow up 3 months.  RTO sooner prn

## 2018-02-11 ENCOUNTER — Other Ambulatory Visit (HOSPITAL_COMMUNITY)
Admission: RE | Admit: 2018-02-11 | Discharge: 2018-02-11 | Disposition: A | Discharge status: Home / Self Care | Payer: Self-pay | Source: Ambulatory Visit | Attending: Physician Assistant | Admitting: Physician Assistant

## 2018-02-11 ENCOUNTER — Other Ambulatory Visit: Payer: Self-pay | Admitting: Physician Assistant

## 2018-02-11 DIAGNOSIS — I10 Essential (primary) hypertension: Secondary | ICD-10-CM | POA: Insufficient documentation

## 2018-02-11 DIAGNOSIS — E785 Hyperlipidemia, unspecified: Secondary | ICD-10-CM | POA: Insufficient documentation

## 2018-02-11 LAB — COMPREHENSIVE METABOLIC PANEL
ALK PHOS: 66 U/L (ref 38–126)
ALT: 14 U/L (ref 0–44)
AST: 21 U/L (ref 15–41)
Albumin: 4.6 g/dL (ref 3.5–5.0)
Anion gap: 10 (ref 5–15)
BUN: 15 mg/dL (ref 8–23)
CALCIUM: 9.5 mg/dL (ref 8.9–10.3)
CO2: 21 mmol/L — ABNORMAL LOW (ref 22–32)
CREATININE: 0.88 mg/dL (ref 0.44–1.00)
Chloride: 107 mmol/L (ref 98–111)
Glucose, Bld: 80 mg/dL (ref 70–99)
Potassium: 4.2 mmol/L (ref 3.5–5.1)
Sodium: 138 mmol/L (ref 135–145)
Total Bilirubin: 1.1 mg/dL (ref 0.3–1.2)
Total Protein: 7.5 g/dL (ref 6.5–8.1)

## 2018-02-11 LAB — LIPID PANEL
CHOLESTEROL: 264 mg/dL — AB (ref 0–200)
HDL: 86 mg/dL (ref >40)
LDL Cholesterol: 165 mg/dL — ABNORMAL HIGH (ref 0–99)
TRIGLYCERIDES: 66 mg/dL (ref <150)
Total CHOL/HDL Ratio: 3.1 RATIO
VLDL: 13 mg/dL (ref 0–40)

## 2018-02-11 MED ORDER — ATORVASTATIN CALCIUM 20 MG PO TABS: 20.0000 mg | ORAL_TABLET | Freq: Every day | ORAL | 1 refills | Status: DC

## 2018-02-11 NOTE — Progress Notes (Unsigned)
cmp

## 2018-04-07 ENCOUNTER — Encounter: Payer: Self-pay | Admitting: Physician Assistant

## 2018-04-07 ENCOUNTER — Ambulatory Visit: Payer: Self-pay | Admitting: Physician Assistant

## 2018-04-07 VITALS — BP 142/73 | HR 75 | Temp 97.7°F | Ht 63.0 in | Wt 152.5 lb

## 2018-04-07 DIAGNOSIS — J00 Acute nasopharyngitis [common cold]: Secondary | ICD-10-CM

## 2018-04-07 MED ORDER — BENZONATATE 100 MG PO CAPS: ORAL_CAPSULE | ORAL | 3 refills | Status: DC

## 2018-04-07 NOTE — Patient Instructions (Signed)
Upper Respiratory Infection, Adult An upper respiratory infection (URI) is a common viral infection of the nose, throat, and upper air passages that lead to the lungs. The most common type of URI is the common cold. URIs usually get better on their own, without medical treatment. What are the causes? A URI is caused by a virus. You may catch a virus by:  Breathing in droplets from an infected person's cough or sneeze.  Touching something that has been exposed to the virus (contaminated) and then touching your mouth, nose, or eyes. What increases the risk? You are more likely to get a URI if:  You are very young or very old.  It is autumn or winter.  You have close contact with others, such as at a daycare, school, or health care facility.  You smoke.  You have long-term (chronic) heart or lung disease.  You have a weakened disease-fighting (immune) system.  You have nasal allergies or asthma.  You are experiencing a lot of stress.  You work in an area that has poor air circulation.  You have poor nutrition. What are the signs or symptoms? A URI usually involves some of the following symptoms:  Runny or stuffy (congested) nose.  Sneezing.  Cough.  Sore throat.  Headache.  Fatigue.  Fever.  Loss of appetite.  Pain in your forehead, behind your eyes, and over your cheekbones (sinus pain).  Muscle aches.  Redness or irritation of the eyes.  Pressure in the ears or face. How is this diagnosed? This condition may be diagnosed based on your medical history and symptoms, and a physical exam. Your health care provider may use a cotton swab to take a mucus sample from your nose (nasal swab). This sample can be tested to determine what virus is causing the illness. How is this treated? URIs usually get better on their own within 7-10 days. You can take steps at home to relieve your symptoms. Medicines cannot cure URIs, but your health care provider may recommend  certain medicines to help relieve symptoms, such as:  Over-the-counter cold medicines.  Cough suppressants. Coughing is a type of defense against infection that helps to clear the respiratory system, so take these medicines only as recommended by your health care provider.  Fever-reducing medicines. Follow these instructions at home: Activity  Rest as needed.  If you have a fever, stay home from work or school until your fever is gone or until your health care provider says you are no longer contagious. Your health care provider may have you wear a face mask to prevent your infection from spreading. Relieving symptoms  Gargle with a salt-water mixture 3-4 times a day or as needed. To make a salt-water mixture, completely dissolve -1 tsp of salt in 1 cup of warm water.  Use a cool-mist humidifier to add moisture to the air. This can help you breathe more easily. Eating and drinking   Drink enough fluid to keep your urine pale yellow.  Eat soups and other clear broths. General instructions   Take over-the-counter and prescription medicines only as told by your health care provider. These include cold medicines, fever reducers, and cough suppressants.  Do not use any products that contain nicotine or tobacco, such as cigarettes and e-cigarettes. If you need help quitting, ask your health care provider.  Stay away from secondhand smoke.  Stay up to date on all immunizations, including the yearly (annual) flu vaccine.  Keep all follow-up visits as told by your health   care provider. This is important. How to prevent the spread of infection to others   URIs can be passed from person to person (are contagious). To prevent the infection from spreading: ? Wash your hands often with soap and water. If soap and water are not available, use hand sanitizer. ? Avoid touching your mouth, face, eyes, or nose. ? Cough or sneeze into a tissue or your sleeve or elbow instead of into your hand  or into the air. Contact a health care provider if:  You are getting worse instead of better.  You have a fever or chills.  Your mucus is brown or red.  You have yellow or brown discharge coming from your nose.  You have pain in your face, especially when you bend forward.  You have swollen neck glands.  You have pain while swallowing.  You have white areas in the back of your throat. Get help right away if:  You have shortness of breath that gets worse.  You have severe or persistent: ? Headache. ? Ear pain. ? Sinus pain. ? Chest pain.  You have chronic lung disease along with any of the following: ? Wheezing. ? Prolonged cough. ? Coughing up blood. ? A change in your usual mucus.  You have a stiff neck.  You have changes in your: ? Vision. ? Hearing. ? Thinking. ? Mood. Summary  An upper respiratory infection (URI) is a common infection of the nose, throat, and upper air passages that lead to the lungs.  A URI is caused by a virus.  URIs usually get better on their own within 7-10 days.  Medicines cannot cure URIs, but your health care provider may recommend certain medicines to help relieve symptoms. This information is not intended to replace advice given to you by your health care provider. Make sure you discuss any questions you have with your health care provider. Document Released: 08/08/2000 Document Revised: 09/28/2016 Document Reviewed: 09/28/2016 Elsevier Interactive Patient Education  2019 [employer]    

## 2018-04-07 NOTE — Progress Notes (Signed)
BP (!) 142/73 (BP Location: Left Arm, Patient Position: Sitting, Cuff Size: Normal)   Pulse 75   Temp 97.7 F (36.5 C)   Ht 5\' 3"  (1.6 m)   Wt 152 lb 8 oz (69.2 kg)   SpO2 99%   BMI 27.01 kg/m    Subjective:    Patient ID: Tammy Proctor, female    DOB: [birth year], 63 y.o.   MRN: [id number]  HPI: Tammy Proctor is a 63 y.o. female presenting on 04/07/2018 for Sore Throat (ear pain, HA, body soreness, fever on Friday of 100.1, dry cough, sneezing, runny nose. sx began on Friday 04/04/2018. pt has been drinking herbal teas and aleve but doesn't help.)   HPI  Chief Complaint  Patient presents with  . Sore Throat    ear pain, HA, body soreness, fever on Friday of 100.1, dry cough, sneezing, runny nose. sx began on Friday 04/04/2018. pt has been drinking herbal teas and aleve but doesn't help.     Relevant past medical, surgical, family and social history reviewed and updated as indicated. Interim medical history since our last visit reviewed. Allergies and medications reviewed and updated.  Review of Systems  Constitutional: Positive for appetite change, chills, fatigue and fever. Negative for diaphoresis and unexpected weight change.  HENT: Positive for congestion, drooling, ear pain, facial swelling, sneezing, sore throat and trouble swallowing. Negative for dental problem, hearing loss, mouth sores and voice change.   Eyes: Positive for visual disturbance. Negative for pain, discharge, redness and itching.  Respiratory: Positive for cough and wheezing (little bit). Negative for choking and shortness of breath.   Cardiovascular: Positive for chest pain (with cough). Negative for palpitations and leg swelling.  Gastrointestinal: Positive for abdominal pain. Negative for blood in stool, constipation, diarrhea and vomiting.  Endocrine: Positive for cold intolerance, heat intolerance and polydipsia.  Genitourinary: Negative for decreased urine volume, dysuria and hematuria.   Musculoskeletal: Positive for arthralgias and back pain. Negative for gait problem.  Skin: Negative for rash.  Allergic/Immunologic: Positive for environmental allergies.  Neurological: Positive for light-headedness and headaches. Negative for seizures and syncope.  Hematological: Negative for adenopathy.  Psychiatric/Behavioral: Positive for agitation. Negative for dysphoric mood and suicidal ideas. The patient is not nervous/anxious.     Per HPI unless specifically indicated above     Objective:    BP (!) 142/73 (BP Location: Left Arm, Patient Position: Sitting, Cuff Size: Normal)   Pulse 75   Temp 97.7 F (36.5 C)   Ht 5\' 3"  (1.6 m)   Wt 152 lb 8 oz (69.2 kg)   SpO2 99%   BMI 27.01 kg/m   Wt Readings from Last 3 Encounters:  04/07/18 152 lb 8 oz (69.2 kg)  02/10/18 152 lb (68.9 kg)  11/11/17 149 lb (67.6 kg)    Physical Exam Vitals signs reviewed.  Constitutional:      Appearance: She is well-developed.  HENT:     Head: Normocephalic and atraumatic.     Right Ear: Hearing, tympanic membrane, ear canal and external ear normal.     Left Ear: Hearing, tympanic membrane, ear canal and external ear normal.     Nose: Nose normal.     Mouth/Throat:     Pharynx: Uvula midline. No oropharyngeal exudate.  Neck:     Musculoskeletal: Neck supple.  Cardiovascular:     Rate and Rhythm: Normal rate and regular rhythm.  Pulmonary:     Effort: Pulmonary effort is normal.  Breath sounds: Normal breath sounds. No wheezing.  Lymphadenopathy:     Cervical: No cervical adenopathy.  Skin:    General: Skin is warm and dry.  Neurological:     Mental Status: She is alert and oriented to person, place, and time.  Psychiatric:        Behavior: Behavior normal.         Assessment & Plan:    Encounter Diagnosis  Name Primary?  . Acute nasopharyngitis Yes    -pt counseled to rest, drink plenty of fluids, aleve or APAP prn, avoid smoking -gave rx tessalon to use as  needed -pt to follow up as scheduled.  RTO sooner prn worsening or new symptoms

## 2018-04-15 ENCOUNTER — Encounter (HOSPITAL_COMMUNITY): Payer: Self-pay

## 2018-04-15 ENCOUNTER — Ambulatory Visit (HOSPITAL_COMMUNITY)
Admission: RE | Admit: 2018-04-15 | Discharge: 2018-04-15 | Disposition: A | Discharge status: Home / Self Care | Payer: Self-pay | Source: Ambulatory Visit | Attending: Obstetrics and Gynecology | Admitting: Obstetrics and Gynecology

## 2018-04-15 ENCOUNTER — Ambulatory Visit
Admission: RE | Admit: 2018-04-15 | Discharge: 2018-04-15 | Disposition: A | Discharge status: Home / Self Care | Payer: Self-pay | Source: Ambulatory Visit | Attending: Obstetrics and Gynecology | Admitting: Obstetrics and Gynecology

## 2018-04-15 VITALS — BP 129/72 | Ht 63.0 in | Wt 154.0 lb

## 2018-04-15 DIAGNOSIS — Z1231 Encounter for screening mammogram for malignant neoplasm of breast: Secondary | ICD-10-CM

## 2018-04-15 DIAGNOSIS — Z01419 Encounter for gynecological examination (general) (routine) without abnormal findings: Secondary | ICD-10-CM

## 2018-04-15 NOTE — Progress Notes (Signed)
No complaints today.   Pap Smear: Pap smear completed today. Last Pap smear was 04/25/2015 at free cervical cancer screening at the [facility] and normal. Per patient has no history of an abnormal Pap smear. Last Pap smear result is in Epic.  Physical exam: Breasts Breasts symmetrical. No skin abnormalities bilateral breasts. No nipple retraction bilateral breasts. No nipple discharge bilateral breasts. No lymphadenopathy. No lumps palpated bilateral breasts. No complaints of pain or tenderness on exam. Referred patient to the [place] for a screening mammogram. Appointment scheduled for Tuesday, April 15, 2018 at 1510.        Pelvic/Bimanual   Ext Genitalia No lesions. Left labia hardened and slightly swollen. Per patient the firm area on labia has been checked in the past and patient stated is benign. No discharge observed on external genitalia.         Vagina Vagina pink and normal texture. No lesions or discharge observed in vagina.          Cervix Cervix is present. Cervix pink and of normal texture. No discharge observed.     Uterus Uterus is present and palpable. Uterus in normal position and normal size.        Adnexae Bilateral ovaries present and palpable. No tenderness on palpation.         Rectovaginal No rectal exam completed today since patient had no rectal complaints. No skin abnormalities observed on exam.    Smoking History: Patient is a current smoker. Discussed smoking cessation with patient. Referred to the [facility] Quitline and gave resources to the free smoking cessation classes at [facility].  Patient Navigation: Patient education provided. Access to services provided for patient through [facility] program.   Colorectal Cancer Screening: Per patient had a colonoscopy completed in 2004. No complaints today. FIT Test given to patient to complete and return to [facility].  Breast and Cervical Cancer Risk Assessment: Patient has a family  history of her mother having breast cancer. Patient has no known genetic mutations or history of radiation treatment to the chest before age 5. Patient has no history of cervical dysplasia, immunocompromised, or DES exposure in-utero.  Risk Assessment    Risk Scores      04/15/2018   Last edited by: Loletta Parish, RN   5-year risk: 3.4 %   Lifetime risk: 15.4 %

## 2018-04-15 NOTE — Patient Instructions (Signed)
Explained breast self awareness with [name]. Let patient know [facility] will cover Pap smears and HPV typing every 5 years unless has a history of abnormal Pap smears. Referred patient to the [place] for a screening mammogram. Appointment scheduled for Tuesday, April 15, 2018 at 1510. Patient aware of appointment and will be there. Let patient know will follow up with her within the next couple weeks with results of Pap smear by letter or phone. Informed [facility] that [facility] will [facility] with her within the next couple of weeks with results of mammogram by letter or phone. Discussed smoking cessation with patient. Referred to the [facility] Quitline and gave resources to the free smoking cessation classes at [facility]. [name] verbalized understanding.  Tharon Kitch, Arvil Chaco, RN 2:24 PM

## 2018-04-17 LAB — CYTOLOGY - PAP
Diagnosis: NEGATIVE
Diagnosis: REACTIVE
HPV: NOT DETECTED

## 2018-04-18 ENCOUNTER — Encounter (HOSPITAL_COMMUNITY): Payer: Self-pay | Admitting: *Deleted

## 2018-05-06 ENCOUNTER — Other Ambulatory Visit (HOSPITAL_COMMUNITY)
Admission: RE | Admit: 2018-05-06 | Discharge: 2018-05-06 | Disposition: A | Discharge status: Home / Self Care | Payer: PRIVATE HEALTH INSURANCE | Source: Ambulatory Visit | Attending: Physician Assistant | Admitting: Physician Assistant

## 2018-05-06 DIAGNOSIS — I10 Essential (primary) hypertension: Secondary | ICD-10-CM

## 2018-05-06 DIAGNOSIS — E785 Hyperlipidemia, unspecified: Secondary | ICD-10-CM | POA: Insufficient documentation

## 2018-05-06 LAB — COMPREHENSIVE METABOLIC PANEL
ALK PHOS: 73 U/L (ref 38–126)
ALT: 20 U/L (ref 0–44)
AST: 21 U/L (ref 15–41)
Albumin: 4.5 g/dL (ref 3.5–5.0)
Anion gap: 8 (ref 5–15)
BUN: 16 mg/dL (ref 8–23)
CALCIUM: 9.8 mg/dL (ref 8.9–10.3)
CO2: 25 mmol/L (ref 22–32)
CREATININE: 0.89 mg/dL (ref 0.44–1.00)
Chloride: 106 mmol/L (ref 98–111)
Glucose, Bld: 101 mg/dL — ABNORMAL HIGH (ref 70–99)
Potassium: 4.5 mmol/L (ref 3.5–5.1)
Sodium: 139 mmol/L (ref 135–145)
Total Bilirubin: 1.1 mg/dL (ref 0.3–1.2)
Total Protein: 7.4 g/dL (ref 6.5–8.1)

## 2018-05-06 LAB — LIPID PANEL
Cholesterol: 171 mg/dL (ref 0–200)
HDL: 80 mg/dL (ref >40)
LDL CALC: 72 mg/dL (ref 0–99)
TRIGLYCERIDES: 95 mg/dL (ref <150)
Total CHOL/HDL Ratio: 2.1 RATIO
VLDL: 19 mg/dL (ref 0–40)

## 2018-05-12 ENCOUNTER — Encounter: Payer: Self-pay | Admitting: Physician Assistant

## 2018-05-12 ENCOUNTER — Ambulatory Visit: Payer: Self-pay | Admitting: Physician Assistant

## 2018-05-12 ENCOUNTER — Other Ambulatory Visit: Payer: Self-pay

## 2018-05-12 VITALS — BP 108/60 | HR 85 | Temp 97.2°F

## 2018-05-12 DIAGNOSIS — I10 Essential (primary) hypertension: Secondary | ICD-10-CM

## 2018-05-12 DIAGNOSIS — F17200 Nicotine dependence, unspecified, uncomplicated: Secondary | ICD-10-CM

## 2018-05-12 DIAGNOSIS — E785 Hyperlipidemia, unspecified: Secondary | ICD-10-CM

## 2018-05-12 NOTE — Progress Notes (Signed)
BP 108/60 (BP Location: Left Arm, Patient Position: Sitting, Cuff Size: Normal)   Pulse 85   Temp (!) 97.2 F (36.2 C)   SpO2 98%    Subjective:    Patient ID: Tammy Proctor, female    DOB: 04/05/1955, 63 y.o.   MRN: [id number]  HPI: ALLISSON Proctor is a 63 y.o. female presenting on 05/12/2018 for Hypertension and Hyperlipidemia   HPI   Pt is still smoking.  She is doing well and has no complaints today.    Relevant past medical, surgical, family and social history reviewed and updated as indicated. Interim medical history since our last visit reviewed. Allergies and medications reviewed and updated.   Current Outpatient Medications:  .  amLODipine (NORVASC) 10 MG tablet, Take 1 tablet (10 mg total) by mouth daily., Disp: 30 tablet, Rfl: 3 .  atorvastatin (LIPITOR) 20 MG tablet, Take 1 tablet (20 mg total) by mouth daily., Disp: 90 tablet, Rfl: 1 .  benzonatate (TESSALON PERLES) 100 MG capsule, 1 or 2 capsules every 8 hours as needed for cough, Disp: 20 capsule, Rfl: 3 .  OVER THE COUNTER MEDICATION, Take 2 tablets by mouth as needed (arthitis pain)., Disp: , Rfl:    Review of Systems  Per HPI unless specifically indicated above     Objective:    BP 108/60 (BP Location: Left Arm, Patient Position: Sitting, Cuff Size: Normal)   Pulse 85   Temp (!) 97.2 F (36.2 C)   SpO2 98%   Wt Readings from Last 3 Encounters:  04/15/18 154 lb (69.9 kg)  04/07/18 152 lb 8 oz (69.2 kg)  02/10/18 152 lb (68.9 kg)    Physical Exam Vitals signs reviewed.  Constitutional:      Appearance: She is well-developed.  HENT:     Head: Normocephalic and atraumatic.  Neck:     Musculoskeletal: Neck supple.  Cardiovascular:     Rate and Rhythm: Normal rate and regular rhythm.  Pulmonary:     Effort: Pulmonary effort is normal.     Breath sounds: Normal breath sounds.  Abdominal:     General: Bowel sounds are normal.     Palpations: Abdomen is soft. There is no mass.   Tenderness: There is no abdominal tenderness.  Musculoskeletal:     Right lower leg: No edema.     Left lower leg: No edema.  Lymphadenopathy:     Cervical: No cervical adenopathy.  Skin:    General: Skin is warm and dry.  Neurological:     Mental Status: She is alert and oriented to person, place, and time.  Psychiatric:        Behavior: Behavior normal.     Results for orders placed or performed during the hospital encounter of 05/06/18  Lipid panel  Result Value Ref Range   Cholesterol 171 0 - 200 mg/dL   Triglycerides 95 <150 mg/dL   HDL 80 >40 mg/dL   Total CHOL/HDL Ratio 2.1 RATIO   VLDL 19 0 - 40 mg/dL   LDL Cholesterol 72 0 - 99 mg/dL  Comprehensive metabolic panel  Result Value Ref Range   Sodium 139 135 - 145 mmol/L   Potassium 4.5 3.5 - 5.1 mmol/L   Chloride 106 98 - 111 mmol/L   CO2 25 22 - 32 mmol/L   Glucose, Bld 101 (H) 70 - 99 mg/dL   BUN 16 8 - 23 mg/dL   Creatinine, Ser 0.89 0.44 - 1.00 mg/dL   Calcium 9.8  8.9 - 10.3 mg/dL   Total Protein 7.4 6.5 - 8.1 g/dL   Albumin 4.5 3.5 - 5.0 g/dL   AST 21 15 - 41 U/L   ALT 20 0 - 44 U/L   Alkaline Phosphatase 73 38 - 126 U/L   Total Bilirubin 1.1 0.3 - 1.2 mg/dL   GFR calc non Af Amer >60 >60 mL/min   GFR calc Af Amer >60 >60 mL/min   Anion gap 8 5 - 15      Assessment & Plan:   Encounter Diagnoses  Name Primary?  . Hyperlipidemia, unspecified hyperlipidemia type Yes  . Essential hypertension, benign   . Tobacco use disorder      -reviewed labs with pt -pt to continue current medicaitons -counseled smoking cessation -Pt just started lipitor in december so will see back for follow up in 3 months.  Pt to RTO sooner prn

## 2018-05-19 ENCOUNTER — Telehealth: Payer: Self-pay

## 2018-05-20 NOTE — Telephone Encounter (Signed)
Left message with patient informing them that the lung cancer screening that they are signed up for on Monday, March 30th is cancelled because of the [place]-VID19. Screening will be rescheduled for a later date.

## 2018-05-22 ENCOUNTER — Other Ambulatory Visit: Payer: Self-pay | Admitting: Physician Assistant

## 2018-05-26 ENCOUNTER — Ambulatory Visit: Payer: Self-pay

## 2018-05-27 ENCOUNTER — Other Ambulatory Visit: Payer: Self-pay

## 2018-05-28 LAB — SPECIMEN STATUS REPORT

## 2018-05-28 LAB — FECAL OCCULT BLOOD, IMMUNOCHEMICAL: FECAL OCCULT BLD: NEGATIVE

## 2018-06-10 ENCOUNTER — Telehealth: Payer: Self-pay | Admitting: Student

## 2018-06-10 NOTE — Telephone Encounter (Signed)
Pt called c/o slight cough, L ear pain, facial congestion, HA, body aches, little mucous, and runny nose. Pt denies fever. Pt states sx began over the weekend (4-11 or 4-12) and has been taking OTC allergy and congestion medications. Pt states she has been staying home other than going out for groceries and uses a bandana to cover mouth and nose. Pt states she has refills on tessalon and wants to know if she should refill.   LPN recommends for patient to refill tessalon which will help with cough, take tylenol or IBU for body aches, HA, and ear pain, and take mucinex for congestion. Pt was advised to get plenty of rest and drink plenty of fluids. Pt was also advised to stay home, cover cough, maintain distance from others, and to wash hands regularly.  Pt is to call back later this week or early next week if no improvements or if sx worsen. Pt verbalized understanding.

## 2018-06-30 ENCOUNTER — Encounter (HOSPITAL_COMMUNITY): Payer: Self-pay

## 2018-06-30 ENCOUNTER — Emergency Department (HOSPITAL_COMMUNITY): Payer: Self-pay

## 2018-06-30 ENCOUNTER — Other Ambulatory Visit: Payer: Self-pay

## 2018-06-30 ENCOUNTER — Emergency Department (HOSPITAL_COMMUNITY)
Admission: EM | Admit: 2018-06-30 | Discharge: 2018-07-01 | Disposition: A | Discharge status: Home / Self Care | Payer: Self-pay | Attending: Emergency Medicine | Admitting: Emergency Medicine

## 2018-06-30 DIAGNOSIS — W19XXXA Unspecified fall, initial encounter: Secondary | ICD-10-CM

## 2018-06-30 DIAGNOSIS — F10920 Alcohol use, unspecified with intoxication, uncomplicated: Secondary | ICD-10-CM | POA: Insufficient documentation

## 2018-06-30 DIAGNOSIS — S0003XA Contusion of scalp, initial encounter: Secondary | ICD-10-CM | POA: Insufficient documentation

## 2018-06-30 DIAGNOSIS — F17210 Nicotine dependence, cigarettes, uncomplicated: Secondary | ICD-10-CM | POA: Insufficient documentation

## 2018-06-30 DIAGNOSIS — Y9389 Activity, other specified: Secondary | ICD-10-CM | POA: Insufficient documentation

## 2018-06-30 DIAGNOSIS — S8002XA Contusion of left knee, initial encounter: Secondary | ICD-10-CM | POA: Insufficient documentation

## 2018-06-30 DIAGNOSIS — S8001XA Contusion of right knee, initial encounter: Secondary | ICD-10-CM | POA: Insufficient documentation

## 2018-06-30 DIAGNOSIS — Y907 Blood alcohol level of 200-239 mg/100 ml: Secondary | ICD-10-CM | POA: Insufficient documentation

## 2018-06-30 DIAGNOSIS — Y999 Unspecified external cause status: Secondary | ICD-10-CM | POA: Insufficient documentation

## 2018-06-30 DIAGNOSIS — I10 Essential (primary) hypertension: Secondary | ICD-10-CM | POA: Insufficient documentation

## 2018-06-30 DIAGNOSIS — W108XXA Fall (on) (from) other stairs and steps, initial encounter: Secondary | ICD-10-CM | POA: Insufficient documentation

## 2018-06-30 DIAGNOSIS — Y92008 Other place in unspecified non-institutional (private) residence as the place of occurrence of the external cause: Secondary | ICD-10-CM | POA: Insufficient documentation

## 2018-06-30 LAB — CBC WITH DIFFERENTIAL/PLATELET
Abs Immature Granulocytes: 0.03 10*3/uL (ref 0.00–0.07)
Basophils Absolute: 0.1 10*3/uL (ref 0.0–0.1)
Basophils Relative: 1 %
Eosinophils Absolute: 0.2 10*3/uL (ref 0.0–0.5)
Eosinophils Relative: 2 %
HCT: 41.4 % (ref 36.0–46.0)
Hemoglobin: 13.2 g/dL (ref 12.0–15.0)
Immature Granulocytes: 0 %
Lymphocytes Relative: 32 %
Lymphs Abs: 2.5 10*3/uL (ref 0.7–4.0)
MCH: 30.3 pg (ref 26.0–34.0)
MCHC: 31.9 g/dL (ref 30.0–36.0)
MCV: 95 fL (ref 80.0–100.0)
Monocytes Absolute: 0.6 10*3/uL (ref 0.1–1.0)
Monocytes Relative: 7 %
Neutro Abs: 4.5 10*3/uL (ref 1.7–7.7)
Neutrophils Relative %: 58 %
Platelets: 232 10*3/uL (ref 150–400)
RBC: 4.36 MIL/uL (ref 3.87–5.11)
RDW: 13.1 % (ref 11.5–15.5)
WBC: 7.9 10*3/uL (ref 4.0–10.5)
nRBC: 0 % (ref 0.0–0.2)

## 2018-06-30 LAB — COMPREHENSIVE METABOLIC PANEL
ALT: 16 U/L (ref 0–44)
AST: 20 U/L (ref 15–41)
Albumin: 4.4 g/dL (ref 3.5–5.0)
Alkaline Phosphatase: 71 U/L (ref 38–126)
Anion gap: 10 (ref 5–15)
BUN: 14 mg/dL (ref 8–23)
CO2: 20 mmol/L — ABNORMAL LOW (ref 22–32)
Calcium: 9.2 mg/dL (ref 8.9–10.3)
Chloride: 112 mmol/L — ABNORMAL HIGH (ref 98–111)
Creatinine, Ser: 0.85 mg/dL (ref 0.44–1.00)
GFR calc Af Amer: >60 mL/min (ref >60)
GFR calc non Af Amer: >60 mL/min (ref >60)
Glucose, Bld: 101 mg/dL — ABNORMAL HIGH (ref 70–99)
Potassium: 3.8 mmol/L (ref 3.5–5.1)
Sodium: 142 mmol/L (ref 135–145)
Total Bilirubin: 0.5 mg/dL (ref 0.3–1.2)
Total Protein: 7.1 g/dL (ref 6.5–8.1)

## 2018-06-30 LAB — ETHANOL: Alcohol, Ethyl (B): 218 mg/dL — ABNORMAL HIGH (ref <10)

## 2018-06-30 LAB — TROPONIN I: Troponin I: <0.03 ng/mL (ref <0.03)

## 2018-06-30 MED ORDER — SODIUM CHLORIDE 0.9 % IV BOLUS
500.0000 mL | Freq: Once | INTRAVENOUS | Status: AC
  Administered 2018-06-30: 21:00:00 500 mL via INTRAVENOUS

## 2018-06-30 NOTE — ED Notes (Signed)
Patient transported to X-ray 

## 2018-06-30 NOTE — ED Triage Notes (Signed)
Pt arrived via [facility] after falling down 14 steps today, pts family reports LOC. Pt originally refused transport. Family called back out due to pt lethargic. Pt endorses drinking alcohol tonight.

## 2018-06-30 NOTE — ED Provider Notes (Signed)
Emergency Department Provider Note   I have reviewed the triage vital signs and the nursing notes.   HISTORY  Chief Complaint Fall   HPI Tammy Proctor is a 63 y.o. female with PMH of HTN and migraine HA ends to the emergency department for evaluation after head injury during fall with possible syncope.  Patient arrives by EMS.  She tells me that she been drinking earlier in the day.  She feels like she got lightheaded at the top of the stairs and fell down approximately 14 stairs.  EMS was called to the house but patient initially refused transport.  They were called back later in the evening with the patient apparently passed out on the couch. She denies any chest pain, heart palpitations, or additional lightheadedness.  Patient denies drinking any additional alcohol after her initial fall down the steps.  She has been up and ambulatory.  She is not experiencing any pain in the arms or legs.  She has noticed some bruising to the knees.  EMS state that she was drowsy on scene but responsive to voice.  She had repetitive questioning in route with EMS.  She denies any unilateral weakness or numbness.  No vision changes.  Denies vertigo symptoms. No SOB or respiratory symptoms.   Past Medical History:  Diagnosis Date  . Anxiety   . Hypertension   . Migraine     Patient Active Problem List   Diagnosis Date Noted  . Anxiety 05/14/2016  . Situational depression 05/16/2015  . Essential hypertension, benign 01/22/2015  . Cigarette nicotine dependence, uncomplicated 25/95/6387    Past Surgical History:  Procedure Laterality Date  . BREAST EXCISIONAL BIOPSY Right    benign  . CATARACT EXTRACTION Right   . NASAL SINUS SURGERY    . TUBAL LIGATION      Allergies Ceclor [cefaclor]  Family History  Problem Relation Age of Onset  . Cancer Mother        breast  . Diabetes Mother   . Heart disease Father   . Mental illness Father   . Heart disease Sister   . Diabetes Brother    . Diabetes Sister     Social History Social History   Tobacco Use  . Smoking status: Current Every Day Smoker    Packs/day: 0.25    Years: 52.00    Pack years: 13.00    Types: Cigarettes    Start date: 08/12/1974  . Smokeless tobacco: Never Used  Substance Use Topics  . Alcohol use: Yes    Comment: occasional  . Drug use: Yes    Types: Marijuana    Comment: once per month    Review of Systems  Constitutional: No fever/chills Eyes: No visual changes. ENT: No sore throat. Cardiovascular: Denies chest pain. Question syncope.  Respiratory: Denies shortness of breath. Gastrointestinal: No abdominal pain.  No nausea, no vomiting.  No diarrhea.  No constipation. Genitourinary: Negative for dysuria. Musculoskeletal: Negative for back pain. Skin: Negative for rash. Neurological: Negative for focal weakness or numbness. Positive HA.   10-point ROS otherwise negative.  ____________________________________________   PHYSICAL EXAM:  VITAL SIGNS: ED Triage Vitals  Enc Vitals Group     BP 06/30/18 2023 122/78     Pulse Rate 06/30/18 2023 83     Resp 06/30/18 2023 17     Temp 06/30/18 2023 97.6 F (36.4 C)     Temp Source 06/30/18 2023 Oral     SpO2 06/30/18 2023 98 %  Pain Score 06/30/18 2024 6   Constitutional: Alert and oriented. Well appearing and in no acute distress. Eyes: Conjunctivae are injected bilaterally. Left pupil 80mm with Right 44mm. (h/o of cataract surgery). Head: Small occipital hematoma without laceration.  Nose: No congestion/rhinnorhea. Mouth/Throat: Mucous membranes are moist.  Oropharynx non-erythematous. Neck: No stridor. C-collar in place.  Cardiovascular: Normal rate, regular rhythm. Good peripheral circulation. Grossly normal heart sounds.   Respiratory: Normal respiratory effort.  No retractions. Lungs CTAB. Gastrointestinal: Soft and nontender. No distention.  Musculoskeletal: No lower extremity tenderness nor edema. No gross deformities  of extremities. Normal passive ROM of all joints in the upper and lower extremities.  Neurologic:  Normal speech and language. No gross focal neurologic deficits are appreciated.  Skin:  Skin is warm and dry. Slight bruising to the bilateral knees without laceration.   ____________________________________________   LABS (all labs ordered are listed, but only abnormal results are displayed)  Labs Reviewed  COMPREHENSIVE METABOLIC PANEL - Abnormal; Notable for the following components:      Result Value   Chloride 112 (*)    CO2 20 (*)    Glucose, Bld 101 (*)    All other components within normal limits  ETHANOL - Abnormal; Notable for the following components:   Alcohol, Ethyl (B) 218 (*)    All other components within normal limits  RAPID URINE DRUG SCREEN, HOSP PERFORMED - Abnormal; Notable for the following components:   Tetrahydrocannabinol POSITIVE (*)    All other components within normal limits  CBC WITH DIFFERENTIAL/PLATELET  TROPONIN I   ____________________________________________  EKG   EKG Interpretation  Date/Time:  Monday Jun 30 2018 20:29:08 EDT Ventricular Rate:  86 PR Interval:    QRS Duration: 101 QT Interval:  360 QTC Calculation: 431 R Axis:   74 Text Interpretation:  Sinus rhythm No STEMI.  Confirmed by Nanda Quinton [phone]) on 06/30/2018 8:33:49 PM Also confirmed by Nanda Quinton [phone]), editor Hattie Perch ([id number])  on 07/01/2018 7:19:38 AM       ____________________________________________  RADIOLOGY  CT and CXR reviewed. No scapular tenderness.  ____________________________________________   PROCEDURES  Procedure(s) performed:   Procedures  None ____________________________________________   INITIAL IMPRESSION / ASSESSMENT AND PLAN / ED COURSE  Pertinent labs & imaging results that were available during my care of the patient were reviewed by me and considered in my medical decision making (see chart for details).   Patient  presents to the emergency department for evaluation after fall down steps earlier today.  Possible syncope event both before the fall and afterwards.  Some repetitive questioning with EMS.  Patient seems somewhat intoxicated with me.  She is awake and alert.  She is unable to provide a full history and participate with exam.  Possible small occipital hematoma.  No apparent injuries to the extremities.  Plan for CT imaging of the head, face, cervical spine along with chest x-ray.  Will obtain screening lab work including EtOH and U tox.  Patient is on monitor.  EKG shows no arrhythmia or acute ischemic changes.  CT imaging negative. CXR with possible no-displaced scapular fracture. No tenderness on exam. Patient clinically sobering in the ED and will likely discharge with a sober driver home.   Care transferred to Dr. Dina Rich.  ____________________________________________  FINAL CLINICAL IMPRESSION(S) / ED DIAGNOSES  Final diagnoses:  Fall, initial encounter  Alcoholic intoxication without complication ([place])     MEDICATIONS GIVEN DURING THIS VISIT:  Medications  sodium chloride 0.9 % bolus  500 mL (0 mLs Intravenous Stopped 06/30/18 2133)  ondansetron (ZOFRAN-ODT) disintegrating tablet 4 mg (4 mg Oral Given 07/01/18 0701)    Note:  This document was prepared using Dragon voice recognition software and may include unintentional dictation errors.  Nanda Quinton, MD Emergency Medicine    Rylea Selway, [facility], MD 07/02/18 1311

## 2018-06-30 NOTE — ED Notes (Signed)
Bed: XI71 Expected date:  Expected time:  Means of arrival:  Comments: [facility] 63 yo female from home/fell down 14 steps after syncopal episode-declined transport at that time/had another syncopal episode/triaged from 9

## 2018-07-01 LAB — RAPID URINE DRUG SCREEN, HOSP PERFORMED
Amphetamines: NOT DETECTED
Barbiturates: NOT DETECTED
Benzodiazepines: NOT DETECTED
Cocaine: NOT DETECTED
Opiates: NOT DETECTED
Tetrahydrocannabinol: POSITIVE — AB

## 2018-07-01 MED ORDER — ONDANSETRON 4 MG PO TBDP
4.0000 mg | ORAL_TABLET | Freq: Once | ORAL | Status: AC
  Administered 2018-07-01: 4 mg via ORAL
  Filled 2018-07-01: qty 1

## 2018-07-01 NOTE — ED Notes (Addendum)
Pt vomited clear liquid on floor. Said she feels weak and dizzy.

## 2018-07-01 NOTE — Discharge Instructions (Addendum)
You were seen today after a fall.  This is likely related to your alcohol use.  Make sure that you stay hydrated over the next several days.  Avoid excessive alcohol use.

## 2018-07-01 NOTE — ED Notes (Signed)
Pt started to sit up to drink water, immediately laid back down, said she was dizzy.

## 2018-07-01 NOTE — ED Provider Notes (Signed)
Patient signed out pending sobriety.  Patient has been monitored overnight.  She has been ambulatory to the bathroom.  She reports some persistent lightheadedness.  However, she is ambulatory independently on my evaluation.  Blood alcohol level was 218.  Patient reports that she does not normally drink daily.  I have recommended that nursing get her something to eat and drink.  She will call her ride.  After history, exam, and medical workup I feel the patient has been appropriately medically screened and is safe for discharge home. Pertinent diagnoses were discussed with the patient. Patient was given return precautions.    Merryl Hacker, MD 07/01/18 [phone]

## 2018-07-01 NOTE — ED Notes (Signed)
Pt ambulated to the restroom with some assistance, gait still slightly unsteady. Pt currently sitting up eating.

## 2018-07-07 ENCOUNTER — Encounter (HOSPITAL_COMMUNITY): Payer: Self-pay | Admitting: *Deleted

## 2018-07-07 NOTE — Progress Notes (Signed)
Letter mailed to patient wit negative pap smear results. HPV was negative. Next pap smear due in five years.

## 2018-08-04 ENCOUNTER — Other Ambulatory Visit (HOSPITAL_COMMUNITY)
Admission: RE | Admit: 2018-08-04 | Discharge: 2018-08-04 | Disposition: A | Discharge status: Home / Self Care | Payer: Self-pay | Source: Ambulatory Visit | Attending: Physician Assistant | Admitting: Physician Assistant

## 2018-08-04 DIAGNOSIS — E785 Hyperlipidemia, unspecified: Secondary | ICD-10-CM | POA: Insufficient documentation

## 2018-08-04 DIAGNOSIS — I10 Essential (primary) hypertension: Secondary | ICD-10-CM | POA: Insufficient documentation

## 2018-08-04 LAB — COMPREHENSIVE METABOLIC PANEL
ALT: 26 U/L (ref 0–44)
AST: 29 U/L (ref 15–41)
Albumin: 4.4 g/dL (ref 3.5–5.0)
Alkaline Phosphatase: 82 U/L (ref 38–126)
Anion gap: 12 (ref 5–15)
BUN: 14 mg/dL (ref 8–23)
CO2: 23 mmol/L (ref 22–32)
Calcium: 9.7 mg/dL (ref 8.9–10.3)
Chloride: 104 mmol/L (ref 98–111)
Creatinine, Ser: 0.93 mg/dL (ref 0.44–1.00)
GFR calc Af Amer: >60 mL/min (ref >60)
GFR calc non Af Amer: >60 mL/min (ref >60)
Glucose, Bld: 103 mg/dL — ABNORMAL HIGH (ref 70–99)
Potassium: 3.8 mmol/L (ref 3.5–5.1)
Sodium: 139 mmol/L (ref 135–145)
Total Bilirubin: 0.8 mg/dL (ref 0.3–1.2)
Total Protein: 7.3 g/dL (ref 6.5–8.1)

## 2018-08-04 LAB — LIPID PANEL
Cholesterol: 180 mg/dL (ref 0–200)
HDL: 91 mg/dL (ref >40)
LDL Cholesterol: 78 mg/dL (ref 0–99)
Total CHOL/HDL Ratio: 2 RATIO
Triglycerides: 57 mg/dL (ref <150)
VLDL: 11 mg/dL (ref 0–40)

## 2018-08-11 ENCOUNTER — Encounter: Payer: Self-pay | Admitting: Physician Assistant

## 2018-08-11 ENCOUNTER — Ambulatory Visit: Payer: Self-pay | Admitting: Physician Assistant

## 2018-08-11 DIAGNOSIS — F17200 Nicotine dependence, unspecified, uncomplicated: Secondary | ICD-10-CM

## 2018-08-11 DIAGNOSIS — E785 Hyperlipidemia, unspecified: Secondary | ICD-10-CM

## 2018-08-11 DIAGNOSIS — F39 Unspecified mood [affective] disorder: Secondary | ICD-10-CM | POA: Insufficient documentation

## 2018-08-11 DIAGNOSIS — I10 Essential (primary) hypertension: Secondary | ICD-10-CM

## 2018-08-11 NOTE — Progress Notes (Signed)
There were no vitals taken for this visit.   Subjective:    Patient ID: Tammy Proctor, female    DOB: 08-19-1955, 63 y.o.   MRN: [id number]  HPI: Tammy Proctor is a 63 y.o. female presenting on 08/11/2018 for No chief complaint on file.   HPI    This is a telemedicine visit due to coronavirus pandemic.  It is via Telephone as pt does not have a mobile phone with video capabilities  I connected with  [name] on 08/11/18 by a video enabled telemedicine application and verified that I am speaking with the correct person using two identifiers.   I discussed the limitations of evaluation and management by telemedicine. The patient expressed understanding and agreed to proceed.   Pt is at home.  Provider is at office   [facility]- she had appointment last week  She is still smoking.   She is doing better with not drinking so much  She has not complaints today and denies fever, cough, sob.     Relevant past medical, surgical, family and social history reviewed and updated as indicated. Interim medical history since our last visit reviewed. Allergies and medications reviewed and updated.   Current Outpatient Medications:  .  amLODipine (NORVASC) 10 MG tablet, TAKE 1 Tablet BY MOUTH ONCE DAILY (Patient taking differently: Take 10 mg by mouth daily. ), Disp: 90 tablet, Rfl: 2 .  atorvastatin (LIPITOR) 20 MG tablet, Take 1 tablet (20 mg total) by mouth daily., Disp: 90 tablet, Rfl: 1     Review of Systems  Per HPI unless specifically indicated above     Objective:    There were no vitals taken for this visit.  Wt Readings from Last 3 Encounters:  04/15/18 154 lb (69.9 kg)  04/07/18 152 lb 8 oz (69.2 kg)  02/10/18 152 lb (68.9 kg)    Physical Exam Pulmonary:     Effort: Pulmonary effort is normal. No respiratory distress.  Neurological:     Mental Status: She is alert and oriented to person, place, and  time.  Psychiatric:        Attention and Perception: Attention normal.        Mood and Affect: Mood normal.        Speech: Speech normal.        Behavior: Behavior is cooperative.     Results for orders placed or performed during the hospital encounter of 08/04/18  Lipid panel  Result Value Ref Range   Cholesterol 180 0 - 200 mg/dL   Triglycerides 57 <150 mg/dL   HDL 91 >40 mg/dL   Total CHOL/HDL Ratio 2.0 RATIO   VLDL 11 0 - 40 mg/dL   LDL Cholesterol 78 0 - 99 mg/dL  Comprehensive metabolic panel  Result Value Ref Range   Sodium 139 135 - 145 mmol/L   Potassium 3.8 3.5 - 5.1 mmol/L   Chloride 104 98 - 111 mmol/L   CO2 23 22 - 32 mmol/L   Glucose, Bld 103 (H) 70 - 99 mg/dL   BUN 14 8 - 23 mg/dL   Creatinine, Ser 0.93 0.44 - 1.00 mg/dL   Calcium 9.7 8.9 - 10.3 mg/dL   Total Protein 7.3 6.5 - 8.1 g/dL   Albumin 4.4 3.5 - 5.0 g/dL   AST 29 15 - 41 U/L   ALT 26 0 - 44 U/L   Alkaline Phosphatase 82 38 - 126  U/L   Total Bilirubin 0.8 0.3 - 1.2 mg/dL   GFR calc non Af Amer >60 >60 mL/min   GFR calc Af Amer >60 >60 mL/min   Anion gap 12 5 - 15      Assessment & Plan:   Encounter Diagnoses  Name Primary?  . Essential hypertension, benign Yes  . Hyperlipidemia, unspecified hyperlipidemia type   . Tobacco use disorder   . Mood disorder ([place])     -reviewed labs with pt -pt to Continue with [facility] for mental health care -pt to Contube current meds -encouraged pt to [place] wearing mask when she goes to the store per CDC guidelines -pt to Follow up 3 months.  She is to contact office sooner prn

## 2018-08-15 ENCOUNTER — Other Ambulatory Visit: Payer: Self-pay | Admitting: Physician Assistant

## 2018-09-05 ENCOUNTER — Telehealth: Payer: Self-pay

## 2018-09-05 NOTE — Telephone Encounter (Signed)
Left message with patient about lung screening on 09/08/18. Left name and number for patient to call back to confirm appt.

## 2018-09-08 ENCOUNTER — Ambulatory Visit: Payer: Self-pay

## 2018-11-10 ENCOUNTER — Ambulatory Visit: Payer: Self-pay

## 2018-11-24 ENCOUNTER — Ambulatory Visit: Payer: Self-pay | Admitting: Physician Assistant

## 2018-12-15 ENCOUNTER — Ambulatory Visit: Payer: Self-pay | Admitting: Physician Assistant

## 2018-12-17 ENCOUNTER — Other Ambulatory Visit (HOSPITAL_COMMUNITY)
Admission: RE | Admit: 2018-12-17 | Discharge: 2018-12-17 | Disposition: A | Discharge status: Home / Self Care | Payer: Self-pay | Source: Ambulatory Visit | Attending: Physician Assistant | Admitting: Physician Assistant

## 2018-12-17 DIAGNOSIS — I10 Essential (primary) hypertension: Secondary | ICD-10-CM | POA: Insufficient documentation

## 2018-12-17 DIAGNOSIS — E785 Hyperlipidemia, unspecified: Secondary | ICD-10-CM | POA: Insufficient documentation

## 2018-12-17 LAB — LIPID PANEL
Cholesterol: 193 mg/dL (ref 0–200)
HDL: 94 mg/dL (ref >40)
LDL Cholesterol: 86 mg/dL (ref 0–99)
Total CHOL/HDL Ratio: 2.1 RATIO
Triglycerides: 64 mg/dL (ref <150)
VLDL: 13 mg/dL (ref 0–40)

## 2018-12-17 LAB — COMPREHENSIVE METABOLIC PANEL
ALT: 18 U/L (ref 0–44)
AST: 24 U/L (ref 15–41)
Albumin: 4.4 g/dL (ref 3.5–5.0)
Alkaline Phosphatase: 76 U/L (ref 38–126)
Anion gap: 9 (ref 5–15)
BUN: 11 mg/dL (ref 8–23)
CO2: 22 mmol/L (ref 22–32)
Calcium: 9.5 mg/dL (ref 8.9–10.3)
Chloride: 106 mmol/L (ref 98–111)
Creatinine, Ser: 0.87 mg/dL (ref 0.44–1.00)
GFR calc Af Amer: >60 mL/min (ref >60)
GFR calc non Af Amer: >60 mL/min (ref >60)
Glucose, Bld: 96 mg/dL (ref 70–99)
Potassium: 3.7 mmol/L (ref 3.5–5.1)
Sodium: 137 mmol/L (ref 135–145)
Total Bilirubin: 0.7 mg/dL (ref 0.3–1.2)
Total Protein: 7 g/dL (ref 6.5–8.1)

## 2018-12-22 ENCOUNTER — Ambulatory Visit: Payer: Self-pay | Admitting: Physician Assistant

## 2018-12-25 ENCOUNTER — Encounter: Payer: Self-pay | Admitting: Physician Assistant

## 2018-12-25 ENCOUNTER — Other Ambulatory Visit: Payer: Self-pay | Admitting: *Deleted

## 2018-12-25 ENCOUNTER — Ambulatory Visit: Payer: Self-pay | Admitting: Physician Assistant

## 2018-12-25 ENCOUNTER — Other Ambulatory Visit: Payer: Self-pay

## 2018-12-25 DIAGNOSIS — I10 Essential (primary) hypertension: Secondary | ICD-10-CM

## 2018-12-25 DIAGNOSIS — Z20822 Contact with and (suspected) exposure to covid-19: Secondary | ICD-10-CM

## 2018-12-25 DIAGNOSIS — R52 Pain, unspecified: Secondary | ICD-10-CM

## 2018-12-25 DIAGNOSIS — F17200 Nicotine dependence, unspecified, uncomplicated: Secondary | ICD-10-CM

## 2018-12-25 DIAGNOSIS — R0981 Nasal congestion: Secondary | ICD-10-CM

## 2018-12-25 DIAGNOSIS — E785 Hyperlipidemia, unspecified: Secondary | ICD-10-CM

## 2018-12-25 MED ORDER — ATORVASTATIN CALCIUM 20 MG PO TABS: 20.0000 mg | ORAL_TABLET | Freq: Every day | ORAL | 1 refills | Status: DC

## 2018-12-25 MED ORDER — AMLODIPINE BESYLATE 10 MG PO TABS: 10.0000 mg | ORAL_TABLET | Freq: Every day | ORAL | 2 refills | Status: DC

## 2018-12-25 NOTE — Progress Notes (Signed)
There were no vitals taken for this visit.   Subjective:    Patient ID: Tammy Proctor, female    DOB: 28-Dec-1955, 63 y.o.   MRN: [id number]  HPI: Tammy Proctor is a 63 y.o. female presenting on 12/25/2018 for No chief complaint on file.   HPI    Patient presented to the office today for an in office appointment, but when she was being asked her COVID-19 screening questionnaire, she states that she was not feeling well and was having body aches.  Therefore her appointment was changed to a telemedicine visit.   Congestion and eyes runny and feeling bad 1 week ago.  Also body aching.  No fever.    Her son at home is not sick.  Pt wears a mask.     Relevant past medical, surgical, family and social history reviewed and updated as indicated. Interim medical history since our last visit reviewed. Allergies and medications reviewed and updated.   Current Outpatient Medications:  .  amLODipine (NORVASC) 10 MG tablet, TAKE 1 Tablet BY MOUTH ONCE DAILY (Patient taking differently: Take 10 mg by mouth daily. ), Disp: 90 tablet, Rfl: 2 .  atorvastatin (LIPITOR) 20 MG tablet, TAKE 1 Tablet BY MOUTH ONCE DAILY, Disp: 90 tablet, Rfl: 1    Review of Systems  Per HPI unless specifically indicated above     Objective:    There were no vitals taken for this visit.  Wt Readings from Last 3 Encounters:  04/15/18 154 lb (69.9 kg)  04/07/18 152 lb 8 oz (69.2 kg)  02/10/18 152 lb (68.9 kg)    Physical Exam Pulmonary:     Effort: No respiratory distress.  Neurological:     Mental Status: She is alert and oriented to person, place, and time.  Psychiatric:        Attention and Perception: Attention normal.        Speech: Speech normal.        Behavior: Behavior is cooperative.     Results for orders placed or performed during the hospital encounter of 12/17/18  Lipid panel  Result Value Ref Range   Cholesterol 193 0 - 200 mg/dL   Triglycerides 64 <150 mg/dL   HDL 94 >40  mg/dL   Total CHOL/HDL Ratio 2.1 RATIO   VLDL 13 0 - 40 mg/dL   LDL Cholesterol 86 0 - 99 mg/dL  Comprehensive metabolic panel  Result Value Ref Range   Sodium 137 135 - 145 mmol/L   Potassium 3.7 3.5 - 5.1 mmol/L   Chloride 106 98 - 111 mmol/L   CO2 22 22 - 32 mmol/L   Glucose, Bld 96 70 - 99 mg/dL   BUN 11 8 - 23 mg/dL   Creatinine, Ser 0.87 0.44 - 1.00 mg/dL   Calcium 9.5 8.9 - 10.3 mg/dL   Total Protein 7.0 6.5 - 8.1 g/dL   Albumin 4.4 3.5 - 5.0 g/dL   AST 24 15 - 41 U/L   ALT 18 0 - 44 U/L   Alkaline Phosphatase 76 38 - 126 U/L   Total Bilirubin 0.7 0.3 - 1.2 mg/dL   GFR calc non Af Amer >60 >60 mL/min   GFR calc Af Amer >60 >60 mL/min   Anion gap 9 5 - 15      Assessment & Plan:    1.  Hypertension  Patient will continue her current amlodipine.  2. Hyperlipidemia  Reviewed labs with patient.  Labs are great today.  Patient will continue her current atorvastatin for her dyslipidemia.  3. Healthcare maintenance   Patient has already received her influenza vaccination this season.  Patient's mammogram Pap and colonoscopy are up-to-date.  Encouraged smoking cessation.  4.  Body aches, cough and congestion, and suspected coronavirus  Patient was sent for COVID-19 testing.  Discussed self-isolation that patient will need to use until she gets her test results.  Encouraged patient to avoid smoking to help her symptoms.   Patient will follow up in 3 months.  She is to contact office sooner as needed.

## 2018-12-26 LAB — NOVEL CORONAVIRUS, NAA: SARS-CoV-2, NAA: DETECTED — AB

## 2019-03-11 ENCOUNTER — Other Ambulatory Visit: Payer: Self-pay | Admitting: Obstetrics and Gynecology

## 2019-03-11 ENCOUNTER — Telehealth (HOSPITAL_COMMUNITY): Payer: Self-pay

## 2019-03-11 DIAGNOSIS — Z1231 Encounter for screening mammogram for malignant neoplasm of breast: Secondary | ICD-10-CM

## 2019-03-11 NOTE — Telephone Encounter (Signed)
Telephoned patient at home number. Left a message to call and schedule with [facility]. 

## 2019-03-26 ENCOUNTER — Other Ambulatory Visit (HOSPITAL_COMMUNITY)
Admission: RE | Admit: 2019-03-26 | Discharge: 2019-03-26 | Disposition: A | Discharge status: Home / Self Care | Payer: Self-pay | Source: Ambulatory Visit | Attending: Physician Assistant | Admitting: Physician Assistant

## 2019-03-26 DIAGNOSIS — E785 Hyperlipidemia, unspecified: Secondary | ICD-10-CM | POA: Insufficient documentation

## 2019-03-26 DIAGNOSIS — I10 Essential (primary) hypertension: Secondary | ICD-10-CM | POA: Insufficient documentation

## 2019-03-26 LAB — COMPREHENSIVE METABOLIC PANEL
ALT: 27 U/L (ref 0–44)
AST: 34 U/L (ref 15–41)
Albumin: 4.4 g/dL (ref 3.5–5.0)
Alkaline Phosphatase: 77 U/L (ref 38–126)
Anion gap: 10 (ref 5–15)
BUN: 12 mg/dL (ref 8–23)
CO2: 24 mmol/L (ref 22–32)
Calcium: 9.3 mg/dL (ref 8.9–10.3)
Chloride: 103 mmol/L (ref 98–111)
Creatinine, Ser: 0.86 mg/dL (ref 0.44–1.00)
GFR calc Af Amer: >60 mL/min (ref >60)
GFR calc non Af Amer: >60 mL/min (ref >60)
Glucose, Bld: 96 mg/dL (ref 70–99)
Potassium: 3.6 mmol/L (ref 3.5–5.1)
Sodium: 137 mmol/L (ref 135–145)
Total Bilirubin: 1.1 mg/dL (ref 0.3–1.2)
Total Protein: 7.1 g/dL (ref 6.5–8.1)

## 2019-03-26 LAB — LIPID PANEL
Cholesterol: 196 mg/dL (ref 0–200)
HDL: 111 mg/dL (ref >40)
LDL Cholesterol: 74 mg/dL (ref 0–99)
Total CHOL/HDL Ratio: 1.8 RATIO
Triglycerides: 57 mg/dL (ref <150)
VLDL: 11 mg/dL (ref 0–40)

## 2019-03-30 ENCOUNTER — Ambulatory Visit: Payer: Self-pay | Admitting: Physician Assistant

## 2019-04-06 ENCOUNTER — Ambulatory Visit: Payer: Self-pay | Admitting: Physician Assistant

## 2019-04-06 ENCOUNTER — Encounter: Payer: Self-pay | Admitting: Physician Assistant

## 2019-04-06 ENCOUNTER — Other Ambulatory Visit: Payer: Self-pay

## 2019-04-06 ENCOUNTER — Ambulatory Visit: Payer: Self-pay | Attending: Internal Medicine

## 2019-04-06 DIAGNOSIS — R05 Cough: Secondary | ICD-10-CM

## 2019-04-06 DIAGNOSIS — R52 Pain, unspecified: Secondary | ICD-10-CM

## 2019-04-06 DIAGNOSIS — Z20822 Contact with and (suspected) exposure to covid-19: Secondary | ICD-10-CM | POA: Insufficient documentation

## 2019-04-06 DIAGNOSIS — R059 Cough, unspecified: Secondary | ICD-10-CM

## 2019-04-06 DIAGNOSIS — F17200 Nicotine dependence, unspecified, uncomplicated: Secondary | ICD-10-CM

## 2019-04-06 DIAGNOSIS — I10 Essential (primary) hypertension: Secondary | ICD-10-CM

## 2019-04-06 DIAGNOSIS — E785 Hyperlipidemia, unspecified: Secondary | ICD-10-CM

## 2019-04-06 NOTE — Patient Instructions (Signed)
3 Key Steps to Take While Waiting for Your COVID-19 Test Result °To help stop the spread of COVID-19, take these 3 key steps NOW while waiting for your test results: °1. Stay home and monitor your health. °Stay home and monitor your health to help protect your friends, family, and others from possibly getting COVID-19 from you. °Stay home and away from others: °· If possible, stay away from others, especially people who are at higher risk for getting very sick from COVID-19, such as older adults and people with other medical conditions. °· If you have been in contact with someone with COVID-19, stay home and away from others for 14 days after your last contact with that person. °· If you have a fever, cough or other symptoms of COVID-19, stay home and away from others (except to get medical care). °Monitor your health: °· Watch for fever, cough, shortness of breath, or other symptoms of COVID-19. Remember, symptoms may appear 2-14 days after exposure to COVID-19 and can include: °? Fever or chills °? Cough °? Shortness of breath or difficulty breathing °? Tiredness °? Muscle or body aches °? Headache °? New loss of taste or smell °? Sore throat °? Congestion or runny nose °? Nausea or vomiting °? Diarrhea °2. Think about the people you have recently been around. °If you are diagnosed with COVID-19, a public health worker may call you to check on your health, discuss who you have been around, and ask where you spent time while you may have been able to spread COVID-19 to others. While you wait for your COVID-19 test result, think about everyone you have been around recently. This will be important information to give health workers if your test is positive.  °Complete the information on the back of this page to help you remember everyone you have been around. °3. Answer the phone call from the health department. °If a public health worker calls you, answer the call to help slow the spread of COVID-19 in your  community. °· Discussions with health department staff are confidential. This means that your personal and medical information will be kept private and only shared with those who may need to know, like your health care provider. °· Your name will not be shared with those you came in contact with. The health department will only notify people you were in close contact with (within 6 feet for more than 15 minutes) that they might have been exposed to COVID-19. °Think about the people you have recently been around °If you test positive and are diagnosed with COVID-19, someone from the health department may call to check-in on your health, discuss who you have been around, and ask where you spent time while you may have been able to spread COVID-19 to others. This form can help you think about people you have recently been around so you will be ready if a public health worker calls you. °Things to think about. Have you: °· Gone to work or school? °· Gotten together with others (eaten out at a restaurant, gone out for drinks, exercised with others or gone to a gym, had friends or family over to your house, volunteered, gone to a party, pool, or park)? °· Gone to a store in person (e.g., grocery store, mall)? °· Gone to in-person appointments (e.g., salon, barber, doctor's or dentist's office)? °· Ridden in a car with others (e.g., Uber or Lyft) or took public transportation? °· Been inside a church, synagogue, mosque or other places   of worship? °Who lives with you? °· ______________________________________________________________________ °· ______________________________________________________________________ °· ______________________________________________________________________ °· ______________________________________________________________________ °Who have you been around (within 6 feet for more than 15 minutes) in the last 10 days? (You may have more people to list than the space provided. If so, write on the  front of this sheet or a separate piece of paper.) °Name ______________________________________________ °· Phone number ____________________________________ °· Date you last saw them _____________________________ °· Where you last saw them ________________________________________________ °Name ______________________________________________ °· Phone number ____________________________________ °· Date you last saw them _____________________________ °· Where you last saw them ________________________________________________ °Name ______________________________________________ °· Phone number ____________________________________ °· Date you last saw them _____________________________ °· Where you last saw them ________________________________________________ °Name ______________________________________________ °· Phone number ____________________________________ °· Date you last saw them _____________________________ °· Where you last saw them ________________________________________________ °Name ______________________________________________ °· Phone number ____________________________________ °· Date you last saw them _____________________________ °· Where you last saw them ________________________________________________ °What have you done in the last 10 days with other people? °Activity _____________________________________________ °· Location _________________________________________ °· Date ____________________________________________ °Activity _____________________________________________ °· Location _________________________________________ °· Date ____________________________________________ °Activity _____________________________________________ °· Location _________________________________________ °· Date ____________________________________________ °Activity _____________________________________________ °· Location _________________________________________ °· Date  ____________________________________________ °Activity _____________________________________________ °· Location _________________________________________ °· Date ____________________________________________ °cdc.gov/coronavirus °09/22/2018 °This information is not intended to replace advice given to you by your health care provider. Make sure you discuss any questions you have with your health care provider. °Document Revised: 01/29/2019 Document Reviewed: 01/29/2019 °Elsevier Patient Education © 2020 [employer] Inc[employer] ° °

## 2019-04-06 NOTE — Progress Notes (Signed)
There were no vitals taken for this visit.   Subjective:    Patient ID: Tammy Proctor, female    DOB: 17-Dec-1955, 64 y.o.   MRN: [id number]  HPI: Tammy Proctor is a 64 y.o. female presenting on 04/06/2019 for No chief complaint on file.   HPI   This is a telemedicine appointment through [employer] due to coronavirus pandemic  I connected with  Tammy Proctor on 04/06/19 by a video enabled telemedicine application and verified that I am speaking with the correct person using two identifiers.   I discussed the limitations of evaluation and management by telemedicine. The patient expressed understanding and agreed to proceed.  Pt is in her parked car in parking lot.  Provider is at office.    Pt was scheduled to come in to office for in person appointment today but it was changed to virtual after pt reported symptoms during screening.  She complains of Aching a lot and Coughing for about a week.  She says she Felt bad last weekend.  She says No fever.  She has had some  nausea  And Some emesis.  Last emesis maybe wednesday or thursday.  She is eating and drinking without difficulty now.    Pt has no known exposures to covid 19.     Relevant past medical, surgical, family and social history reviewed and updated as indicated. Interim medical history since our last visit reviewed. Allergies and medications reviewed and updated.   Current Outpatient Medications:  .  amLODipine (NORVASC) 10 MG tablet, Take 1 tablet (10 mg total) by mouth daily., Disp: 90 tablet, Rfl: 2 .  atorvastatin (LIPITOR) 20 MG tablet, Take 1 tablet (20 mg total) by mouth daily., Disp: 90 tablet, Rfl: 1     Review of Systems  Per HPI unless specifically indicated above     Objective:    There were no vitals taken for this visit.  Wt Readings from Last 3 Encounters:  04/15/18 154 lb (69.9 kg)  04/07/18 152 lb 8 oz (69.2 kg)  02/10/18 152 lb (68.9 kg)    Physical Exam Constitutional:    General: She is not in acute distress.    Appearance: She is not ill-appearing or toxic-appearing.  HENT:     Head: Normocephalic and atraumatic.  Pulmonary:     Effort: Pulmonary effort is normal. No respiratory distress.  Neurological:     Mental Status: She is alert and oriented to person, place, and time.  Psychiatric:        Attention and Perception: Attention normal.        Speech: Speech normal.        Behavior: Behavior is cooperative.     Results for orders placed or performed during the hospital encounter of 03/26/19  Lipid panel  Result Value Ref Range   Cholesterol 196 0 - 200 mg/dL   Triglycerides 57 <150 mg/dL   HDL 111 >40 mg/dL   Total CHOL/HDL Ratio 1.8 RATIO   VLDL 11 0 - 40 mg/dL   LDL Cholesterol 74 0 - 99 mg/dL  Comprehensive metabolic panel  Result Value Ref Range   Sodium 137 135 - 145 mmol/L   Potassium 3.6 3.5 - 5.1 mmol/L   Chloride 103 98 - 111 mmol/L   CO2 24 22 - 32 mmol/L   Glucose, Bld 96 70 - 99 mg/dL   BUN 12 8 - 23 mg/dL   Creatinine, Ser 0.86 0.44 - 1.00 mg/dL   Calcium  9.3 8.9 - 10.3 mg/dL   Total Protein 7.1 6.5 - 8.1 g/dL   Albumin 4.4 3.5 - 5.0 g/dL   AST 34 15 - 41 U/L   ALT 27 0 - 44 U/L   Alkaline Phosphatase 77 38 - 126 U/L   Total Bilirubin 1.1 0.3 - 1.2 mg/dL   GFR calc non Af Amer >60 >60 mL/min   GFR calc Af Amer >60 >60 mL/min   Anion gap 10 5 - 15      Assessment & Plan:    Encounter Diagnoses  Name Primary?  . Suspected 2019 novel coronavirus infection Yes  . Cough   . Body aches   . Essential hypertension, benign   . Hyperlipidemia, unspecified hyperlipidemia type   . Tobacco use disorder       -pt is scheduled for covid testing today.  She is counseled on self-quarantine and is given reading information -Reviewed labs with pt -pt encouraged smoking cessation -pt to follow up 3 months.  She is to contact office sooner prn

## 2019-04-07 LAB — NOVEL CORONAVIRUS, NAA: SARS-CoV-2, NAA: NOT DETECTED

## 2019-04-08 ENCOUNTER — Telehealth: Payer: Self-pay | Admitting: Student

## 2019-04-08 NOTE — Telephone Encounter (Signed)
LPN called and notified pt of negative COVID results. Pt verbalized understanding.

## 2019-06-25 ENCOUNTER — Other Ambulatory Visit: Payer: Self-pay | Admitting: Physician Assistant

## 2019-06-25 DIAGNOSIS — E785 Hyperlipidemia, unspecified: Secondary | ICD-10-CM

## 2019-06-25 DIAGNOSIS — I10 Essential (primary) hypertension: Secondary | ICD-10-CM

## 2019-07-06 ENCOUNTER — Other Ambulatory Visit (HOSPITAL_COMMUNITY)
Admission: RE | Admit: 2019-07-06 | Discharge: 2019-07-06 | Disposition: A | Discharge status: Home / Self Care | Payer: Self-pay | Source: Ambulatory Visit | Attending: Physician Assistant | Admitting: Physician Assistant

## 2019-07-06 ENCOUNTER — Other Ambulatory Visit: Payer: Self-pay

## 2019-07-06 ENCOUNTER — Encounter: Payer: Self-pay | Admitting: Physician Assistant

## 2019-07-06 ENCOUNTER — Ambulatory Visit: Payer: Self-pay | Admitting: Physician Assistant

## 2019-07-06 VITALS — BP 138/80 | HR 81 | Temp 97.9°F | Wt 138.2 lb

## 2019-07-06 DIAGNOSIS — F39 Unspecified mood [affective] disorder: Secondary | ICD-10-CM

## 2019-07-06 DIAGNOSIS — F419 Anxiety disorder, unspecified: Secondary | ICD-10-CM

## 2019-07-06 DIAGNOSIS — F17200 Nicotine dependence, unspecified, uncomplicated: Secondary | ICD-10-CM

## 2019-07-06 DIAGNOSIS — E785 Hyperlipidemia, unspecified: Secondary | ICD-10-CM | POA: Insufficient documentation

## 2019-07-06 DIAGNOSIS — I10 Essential (primary) hypertension: Secondary | ICD-10-CM | POA: Insufficient documentation

## 2019-07-06 DIAGNOSIS — F1290 Cannabis use, unspecified, uncomplicated: Secondary | ICD-10-CM

## 2019-07-06 LAB — LIPID PANEL
Cholesterol: 179 mg/dL (ref 0–200)
HDL: 86 mg/dL (ref >40)
LDL Cholesterol: 69 mg/dL (ref 0–99)
Total CHOL/HDL Ratio: 2.1 RATIO
Triglycerides: 122 mg/dL (ref <150)
VLDL: 24 mg/dL (ref 0–40)

## 2019-07-06 LAB — COMPREHENSIVE METABOLIC PANEL
ALT: 20 U/L (ref 0–44)
AST: 25 U/L (ref 15–41)
Albumin: 4.3 g/dL (ref 3.5–5.0)
Alkaline Phosphatase: 84 U/L (ref 38–126)
Anion gap: 10 (ref 5–15)
BUN: 11 mg/dL (ref 8–23)
CO2: 23 mmol/L (ref 22–32)
Calcium: 9.2 mg/dL (ref 8.9–10.3)
Chloride: 105 mmol/L (ref 98–111)
Creatinine, Ser: 0.79 mg/dL (ref 0.44–1.00)
GFR calc Af Amer: >60 mL/min (ref >60)
GFR calc non Af Amer: >60 mL/min (ref >60)
Glucose, Bld: 106 mg/dL — ABNORMAL HIGH (ref 70–99)
Potassium: 4.1 mmol/L (ref 3.5–5.1)
Sodium: 138 mmol/L (ref 135–145)
Total Bilirubin: 0.8 mg/dL (ref 0.3–1.2)
Total Protein: 7 g/dL (ref 6.5–8.1)

## 2019-07-06 MED ORDER — SERTRALINE HCL 50 MG PO TABS: 50.0000 mg | ORAL_TABLET | Freq: Every day | ORAL | 0 refills | Status: DC

## 2019-07-06 NOTE — Progress Notes (Signed)
BP 138/80   Pulse 81   Temp 97.9 F (36.6 C)   Wt 138 lb 4 oz (62.7 kg)   SpO2 99%   BMI 24.49 kg/m    Subjective:    Patient ID: Tammy Proctor, female    DOB: August 20, 1955, 64 y.o.   MRN: [id number]  HPI: Tammy Proctor is a 64 y.o. female presenting on 07/06/2019 for Hypertension and Hyperlipidemia   HPI  Pt had a negative covid 32 screening questionnaire     Pt is 35yoF with HTN and dyslipidemia.    Pt is now having anxiety attacks for past few months due to a lot of family and neighbor stuff.  She has been smoking MJ to try to help.  She states No depression.  She says the Attacks come couple times/week.    She was on something years ago but doesn't remember what it was.  She would be interested in trying something.   Pt didn't get mammogram- ordered in January. - she was no-show due to transporation issue  Pt getting 2nd covid vaccination shot tomorrow.  She didn't get blood work done.     Relevant past medical, surgical, family and social history reviewed and updated as indicated. Interim medical history since our last visit reviewed. Allergies and medications reviewed and updated.   Current Outpatient Medications:  .  amLODipine (NORVASC) 10 MG tablet, Take 1 tablet (10 mg total) by mouth daily., Disp: 90 tablet, Rfl: 2 .  atorvastatin (LIPITOR) 20 MG tablet, Take 1 tablet (20 mg total) by mouth daily., Disp: 90 tablet, Rfl: 1    Review of Systems  Per HPI unless specifically indicated above     Objective:    BP 138/80   Pulse 81   Temp 97.9 F (36.6 C)   Wt 138 lb 4 oz (62.7 kg)   SpO2 99%   BMI 24.49 kg/m   Wt Readings from Last 3 Encounters:  07/06/19 138 lb 4 oz (62.7 kg)  04/15/18 154 lb (69.9 kg)  04/07/18 152 lb 8 oz (69.2 kg)    Physical Exam Vitals reviewed.  Constitutional:      General: She is not in acute distress.    Appearance: She is well-developed. She is not toxic-appearing.  HENT:     Head: Normocephalic and  atraumatic.  Cardiovascular:     Rate and Rhythm: Normal rate and regular rhythm.  Pulmonary:     Effort: Pulmonary effort is normal.     Breath sounds: Normal breath sounds.  Abdominal:     General: Bowel sounds are normal.     Palpations: Abdomen is soft. There is no mass.     Tenderness: There is no abdominal tenderness.  Musculoskeletal:     Cervical back: Neck supple.     Right lower leg: No edema.     Left lower leg: No edema.  Lymphadenopathy:     Cervical: No cervical adenopathy.  Skin:    General: Skin is warm and dry.  Neurological:     Mental Status: She is alert and oriented to person, place, and time.  Psychiatric:        Attention and Perception: Attention normal.        Speech: Speech normal.        Behavior: Behavior normal. Behavior is cooperative.           Assessment & Plan:    Encounter Diagnoses  Name Primary?  . Essential hypertension, benign Yes  .  Hyperlipidemia, unspecified hyperlipidemia type   . Tobacco use disorder   . Anxiety   . Mood disorder ([place])   . Marijuana user       -Pt wants something medication available through [facility].  Will rx zoloft.  Pt to follow up in  6 wk.  She is encouraged to discuss with counselor/MH specialist.  She has been to [facility] and she is given information for [facility] -Pt to get labs drawn -will reschedule mammogram -pt to contact office if needed before follow up appt in 6 week

## 2019-08-12 ENCOUNTER — Other Ambulatory Visit: Payer: Self-pay | Admitting: Physician Assistant

## 2019-08-12 MED ORDER — ATORVASTATIN CALCIUM 20 MG PO TABS: 20.0000 mg | ORAL_TABLET | Freq: Every day | ORAL | 1 refills | Status: DC

## 2019-08-17 ENCOUNTER — Ambulatory Visit: Payer: Self-pay | Admitting: Physician Assistant

## 2019-08-17 ENCOUNTER — Encounter: Payer: Self-pay | Admitting: Physician Assistant

## 2019-08-17 DIAGNOSIS — E785 Hyperlipidemia, unspecified: Secondary | ICD-10-CM

## 2019-08-17 DIAGNOSIS — F17200 Nicotine dependence, unspecified, uncomplicated: Secondary | ICD-10-CM

## 2019-08-17 DIAGNOSIS — F419 Anxiety disorder, unspecified: Secondary | ICD-10-CM

## 2019-08-17 DIAGNOSIS — I10 Essential (primary) hypertension: Secondary | ICD-10-CM

## 2019-08-17 MED ORDER — AMLODIPINE BESYLATE 10 MG PO TABS: 10.0000 mg | ORAL_TABLET | Freq: Every day | ORAL | 2 refills | Status: DC

## 2019-08-17 MED ORDER — SERTRALINE HCL 50 MG PO TABS: 50.0000 mg | ORAL_TABLET | Freq: Every day | ORAL | 0 refills | Status: DC

## 2019-08-17 MED ORDER — ATORVASTATIN CALCIUM 20 MG PO TABS: 20.0000 mg | ORAL_TABLET | Freq: Every day | ORAL | 1 refills | Status: DC

## 2019-08-17 NOTE — Progress Notes (Signed)
   There were no vitals taken for this visit.   Subjective:    Patient ID: Tammy Proctor, female    DOB: 06/22/1955, 64 y.o.   MRN: [id number]  HPI: Tammy Proctor is a 64 y.o. female presenting on 08/17/2019 for No chief complaint on file.   HPI  This is a telemedicine appointment due to coronavirus pandemic.  It is via telephone as pt does not have a video enabled device.  I connected with  Tammy Proctor on 08/17/19 by a video enabled telemedicine application and verified that I am speaking with the correct person using two identifiers.   I discussed the limitations of evaluation and management by telemedicine. The patient expressed understanding and agreed to proceed.  Pt is at home.  Provider is at office.      Pt appointment today is to follow up on anxiety.  She says she is going back to [facility] now (virtual appts).  She is doing okay with the sertraline.  It is helping her anxiety.    She says she is drinking (etoh) very little.  She says she only drinks one or two days/week..    she says she didn't hear on her mammogram r/s- she was a no-show to her appt in february.  Her second dose of covid vaccine was May 11.      Relevant past medical, surgical, family and social history reviewed and updated as indicated. Interim medical history since our last visit reviewed. Allergies and medications reviewed and updated.   Current Outpatient Medications:  .  amLODipine (NORVASC) 10 MG tablet, Take 1 tablet (10 mg total) by mouth daily., Disp: 90 tablet, Rfl: 2 .  atorvastatin (LIPITOR) 20 MG tablet, Take 1 tablet (20 mg total) by mouth daily., Disp: 90 tablet, Rfl: 1 .  sertraline (ZOLOFT) 50 MG tablet, Take 1 tablet (50 mg total) by mouth daily., Disp: 90 tablet, Rfl: 0    Review of Systems  Per HPI unless specifically indicated above     Objective:    There were no vitals taken for this visit.  Wt Readings from Last 3 Encounters:  07/06/19 138 lb 4 oz  (62.7 kg)  04/15/18 154 lb (69.9 kg)  04/07/18 152 lb 8 oz (69.2 kg)    Physical Exam Pulmonary:     Effort: No respiratory distress.  Neurological:     Mental Status: She is alert and oriented to person, place, and time.  Psychiatric:        Attention and Perception: Attention normal.        Speech: Speech normal.        Behavior: Behavior is cooperative.           Assessment & Plan:    Encounter Diagnoses  Name Primary?  Tammy Proctor Anxiety Yes  . Essential hypertension, benign   . Hyperlipidemia, unspecified hyperlipidemia type   . Tobacco use disorder      -pt to continue with [facility] for counseling.  She will continue zoloft -pt to follow up in 3 months.  She is to contact office sooner prn

## 2019-08-20 ENCOUNTER — Other Ambulatory Visit: Payer: Self-pay | Admitting: Student

## 2019-08-20 DIAGNOSIS — Z1231 Encounter for screening mammogram for malignant neoplasm of breast: Secondary | ICD-10-CM

## 2019-09-07 ENCOUNTER — Ambulatory Visit (HOSPITAL_COMMUNITY)
Admission: RE | Admit: 2019-09-07 | Discharge: 2019-09-07 | Disposition: A | Discharge status: Home / Self Care | Payer: Self-pay | Source: Ambulatory Visit | Attending: Physician Assistant | Admitting: Physician Assistant

## 2019-09-07 ENCOUNTER — Other Ambulatory Visit: Payer: Self-pay

## 2019-09-07 ENCOUNTER — Encounter (HOSPITAL_COMMUNITY): Payer: Self-pay

## 2019-09-07 DIAGNOSIS — Z1231 Encounter for screening mammogram for malignant neoplasm of breast: Secondary | ICD-10-CM | POA: Insufficient documentation

## 2019-11-16 ENCOUNTER — Ambulatory Visit: Payer: Self-pay | Admitting: Physician Assistant

## 2019-11-17 ENCOUNTER — Other Ambulatory Visit (HOSPITAL_COMMUNITY)
Admission: RE | Admit: 2019-11-17 | Discharge: 2019-11-17 | Disposition: A | Discharge status: Home / Self Care | Payer: Self-pay | Source: Ambulatory Visit | Attending: Physician Assistant | Admitting: Physician Assistant

## 2019-11-17 DIAGNOSIS — I10 Essential (primary) hypertension: Secondary | ICD-10-CM | POA: Insufficient documentation

## 2019-11-17 DIAGNOSIS — E785 Hyperlipidemia, unspecified: Secondary | ICD-10-CM | POA: Insufficient documentation

## 2019-11-17 LAB — COMPREHENSIVE METABOLIC PANEL
ALT: 32 U/L (ref 0–44)
AST: 49 U/L — ABNORMAL HIGH (ref 15–41)
Albumin: 4.2 g/dL (ref 3.5–5.0)
Alkaline Phosphatase: 72 U/L (ref 38–126)
Anion gap: 10 (ref 5–15)
BUN: 10 mg/dL (ref 8–23)
CO2: 26 mmol/L (ref 22–32)
Calcium: 9.5 mg/dL (ref 8.9–10.3)
Chloride: 99 mmol/L (ref 98–111)
Creatinine, Ser: 0.81 mg/dL (ref 0.44–1.00)
GFR calc Af Amer: >60 mL/min (ref >60)
GFR calc non Af Amer: >60 mL/min (ref >60)
Glucose, Bld: 110 mg/dL — ABNORMAL HIGH (ref 70–99)
Potassium: 4.5 mmol/L (ref 3.5–5.1)
Sodium: 135 mmol/L (ref 135–145)
Total Bilirubin: 0.7 mg/dL (ref 0.3–1.2)
Total Protein: 7.2 g/dL (ref 6.5–8.1)

## 2019-11-17 LAB — LIPID PANEL
Cholesterol: 197 mg/dL (ref 0–200)
HDL: 114 mg/dL (ref >40)
LDL Cholesterol: 72 mg/dL (ref 0–99)
Total CHOL/HDL Ratio: 1.7 RATIO
Triglycerides: 53 mg/dL (ref <150)
VLDL: 11 mg/dL (ref 0–40)

## 2019-11-24 ENCOUNTER — Ambulatory Visit: Payer: Self-pay | Admitting: Physician Assistant

## 2019-12-02 ENCOUNTER — Other Ambulatory Visit: Payer: Self-pay

## 2019-12-02 ENCOUNTER — Ambulatory Visit: Payer: Self-pay | Admitting: Physician Assistant

## 2019-12-02 ENCOUNTER — Encounter: Payer: Self-pay | Admitting: Physician Assistant

## 2019-12-02 VITALS — BP 126/64 | HR 88 | Temp 97.9°F | Ht 63.25 in | Wt 127.0 lb

## 2019-12-02 DIAGNOSIS — F17200 Nicotine dependence, unspecified, uncomplicated: Secondary | ICD-10-CM

## 2019-12-02 DIAGNOSIS — I10 Essential (primary) hypertension: Secondary | ICD-10-CM

## 2019-12-02 DIAGNOSIS — E785 Hyperlipidemia, unspecified: Secondary | ICD-10-CM

## 2019-12-02 DIAGNOSIS — F1090 Alcohol use, unspecified, uncomplicated: Secondary | ICD-10-CM

## 2019-12-02 DIAGNOSIS — F419 Anxiety disorder, unspecified: Secondary | ICD-10-CM

## 2019-12-02 MED ORDER — SERTRALINE HCL 25 MG PO TABS: 25.0000 mg | ORAL_TABLET | Freq: Every day | ORAL | 1 refills | Status: DC

## 2019-12-02 MED ORDER — AMLODIPINE BESYLATE 10 MG PO TABS
10.0000 mg | ORAL_TABLET | Freq: Every day | ORAL | 1 refills | Status: DC
Start: 2019-12-02 — End: 2020-01-27

## 2019-12-02 MED ORDER — ATORVASTATIN CALCIUM 20 MG PO TABS
20.0000 mg | ORAL_TABLET | Freq: Every day | ORAL | 1 refills | Status: DC
Start: 2019-12-02 — End: 2020-01-27

## 2019-12-02 NOTE — Progress Notes (Signed)
BP 126/64   Pulse 88   Temp 97.9 F (36.6 C)   Ht 5' 3.25" (1.607 m)   Wt 127 lb (57.6 kg)   SpO2 99%   BMI 22.32 kg/m    Subjective:    Patient ID: Tammy Proctor, female    DOB: 1955-04-10, 64 y.o.   MRN: [id number]  HPI: Tammy Proctor is a 64 y.o. female presenting on 12/02/2019 for Hypertension, Hyperlipidemia, and Mental Health Problem   HPI  Pt had a negative covid 19 screening questionnaire.   Pt is 62yoF who presents for routine follow up.  Pt requests lower dose of zoloft because she says her mood is doing good.  She says she is drinking couple times/week,  She drinks 2-40s.  She still does [facility] but she says it's still over the phone.  She is not having any concerns today.      Relevant past medical, surgical, family and social history reviewed and updated as indicated. Interim medical history since our last visit reviewed. Allergies and medications reviewed and updated.   Current Outpatient Medications:  .  amLODipine (NORVASC) 10 MG tablet, Take 1 tablet (10 mg total) by mouth daily., Disp: 90 tablet, Rfl: 2 .  atorvastatin (LIPITOR) 20 MG tablet, Take 1 tablet (20 mg total) by mouth daily., Disp: 90 tablet, Rfl: 1 .  sertraline (ZOLOFT) 50 MG tablet, Take 1 tablet (50 mg total) by mouth daily., Disp: 90 tablet, Rfl: 0     Review of Systems  Per HPI unless specifically indicated above     Objective:    BP 126/64   Pulse 88   Temp 97.9 F (36.6 C)   Ht 5' 3.25" (1.607 m)   Wt 127 lb (57.6 kg)   SpO2 99%   BMI 22.32 kg/m   Wt Readings from Last 3 Encounters:  12/02/19 127 lb (57.6 kg)  07/06/19 138 lb 4 oz (62.7 kg)  04/15/18 154 lb (69.9 kg)    Physical Exam Vitals reviewed.  Constitutional:      General: She is not in acute distress.    Appearance: She is well-developed. She is not ill-appearing.  HENT:     Head: Normocephalic and atraumatic.  Cardiovascular:     Rate and Rhythm: Normal rate and regular rhythm.   Pulmonary:     Effort: Pulmonary effort is normal.     Breath sounds: Normal breath sounds.  Abdominal:     General: Bowel sounds are normal.     Palpations: Abdomen is soft. There is no mass.     Tenderness: There is no abdominal tenderness.  Musculoskeletal:     Cervical back: Neck supple.     Right lower leg: No edema.     Left lower leg: No edema.  Lymphadenopathy:     Cervical: No cervical adenopathy.  Skin:    General: Skin is warm and dry.  Neurological:     Mental Status: She is alert and oriented to person, place, and time.  Psychiatric:        Attention and Perception: Attention normal.        Speech: Speech normal.        Behavior: Behavior normal. Behavior is cooperative.     Comments: Pleasant.  Converses easily.      Results for orders placed or performed during the hospital encounter of 11/17/19  Lipid panel  Result Value Ref Range   Cholesterol 197 0 - 200 mg/dL   Triglycerides 53 <  150 mg/dL   HDL 114 >40 mg/dL   Total CHOL/HDL Ratio 1.7 RATIO   VLDL 11 0 - 40 mg/dL   LDL Cholesterol 72 0 - 99 mg/dL  Comprehensive metabolic panel  Result Value Ref Range   Sodium 135 135 - 145 mmol/L   Potassium 4.5 3.5 - 5.1 mmol/L   Chloride 99 98 - 111 mmol/L   CO2 26 22 - 32 mmol/L   Glucose, Bld 110 (H) 70 - 99 mg/dL   BUN 10 8 - 23 mg/dL   Creatinine, Ser 0.81 0.44 - 1.00 mg/dL   Calcium 9.5 8.9 - 10.3 mg/dL   Total Protein 7.2 6.5 - 8.1 g/dL   Albumin 4.2 3.5 - 5.0 g/dL   AST 49 (H) 15 - 41 U/L   ALT 32 0 - 44 U/L   Alkaline Phosphatase 72 38 - 126 U/L   Total Bilirubin 0.7 0.3 - 1.2 mg/dL   GFR calc non Af Amer >60 >60 mL/min   GFR calc Af Amer >60 >60 mL/min   Anion gap 10 5 - 15      Assessment & Plan:   Encounter Diagnoses  Name Primary?  . Essential hypertension, benign Yes  . Hyperlipidemia, unspecified hyperlipidemia type   . Anxiety   . Tobacco use disorder   . Heavy alcohol use      -reviewed labs with pt -pt to continue amlodipine  and atorvastatin -sertraline dosage decreased.  She is to continue with [facility] -counseled on limiting or eliminating etoh.  Gave handout of local AA meetings -HCM up to date -pt to follow up 4 months.  She is to contact office sooner prn

## 2019-12-09 ENCOUNTER — Telehealth: Payer: Self-pay | Admitting: Physician Assistant

## 2019-12-09 NOTE — Telephone Encounter (Signed)
Pt called saying that she thinks she might have a blockage.   She says she had similar in the past, a long time ago.  She says in the past, she went to hospital and drank the stuff they give you for a colonoscopy and then they did a colonoscopy and then sent her home the next day.  She did not require surgery.  The procedure was done at the [facility] in [place].  She says that her current symptoms started over the weekend with some left sided pain.  She says she got some mag citrate and it moved her bowels a lot but she still thinks she has some in her.  She is eating and drinking.  Some mild nausea.  No emesis.  Pt is given appointment to be seen tomorrow morning.  She is encouraged to drink plenty of water.

## 2019-12-10 ENCOUNTER — Encounter (HOSPITAL_COMMUNITY): Payer: Self-pay

## 2019-12-10 ENCOUNTER — Telehealth: Payer: Self-pay

## 2019-12-10 ENCOUNTER — Other Ambulatory Visit: Payer: Self-pay

## 2019-12-10 ENCOUNTER — Encounter: Payer: Self-pay | Admitting: Physician Assistant

## 2019-12-10 ENCOUNTER — Other Ambulatory Visit: Payer: Self-pay | Admitting: Physician Assistant

## 2019-12-10 ENCOUNTER — Ambulatory Visit (HOSPITAL_COMMUNITY)
Admission: RE | Admit: 2019-12-10 | Discharge: 2019-12-10 | Disposition: A | Discharge status: Home / Self Care | Payer: Self-pay | Source: Ambulatory Visit | Attending: Physician Assistant | Admitting: Physician Assistant

## 2019-12-10 ENCOUNTER — Ambulatory Visit: Payer: Self-pay | Admitting: Physician Assistant

## 2019-12-10 VITALS — BP 126/70 | HR 76 | Temp 97.7°F | Ht 63.25 in | Wt 126.0 lb

## 2019-12-10 DIAGNOSIS — R110 Nausea: Secondary | ICD-10-CM

## 2019-12-10 DIAGNOSIS — K6389 Other specified diseases of intestine: Secondary | ICD-10-CM

## 2019-12-10 DIAGNOSIS — R1012 Left upper quadrant pain: Secondary | ICD-10-CM | POA: Insufficient documentation

## 2019-12-10 DIAGNOSIS — R935 Abnormal findings on diagnostic imaging of other abdominal regions, including retroperitoneum: Secondary | ICD-10-CM

## 2019-12-10 MED ORDER — IOHEXOL 300 MG/ML  SOLN
80.0000 mL | Freq: Once | INTRAMUSCULAR | Status: AC | PRN
  Administered 2019-12-10: 80 mL via INTRAVENOUS

## 2019-12-10 MED ORDER — PROMETHAZINE HCL 25 MG/ML IJ SOLN
25.0000 mg | Freq: Once | INTRAMUSCULAR | Status: AC
  Administered 2019-12-10: 25 mg via INTRAMUSCULAR

## 2019-12-10 MED ORDER — IOHEXOL 9 MG/ML PO SOLN
ORAL | Status: AC
  Filled 2019-12-10: qty 1000

## 2019-12-10 MED ORDER — PROMETHAZINE HCL 25 MG PO TABS: ORAL_TABLET | ORAL | 0 refills | Status: DC

## 2019-12-10 NOTE — Progress Notes (Signed)
BP 126/70   Pulse 76   Temp 97.7 F (36.5 C)   Ht 5' 3.25" (1.607 m)   Wt 126 lb (57.2 kg)   SpO2 93%   BMI 22.14 kg/m    Subjective:    Patient ID: Tammy Proctor, female    DOB: 07-Oct-1955, 64 y.o.   MRN: [id number]  HPI: Tammy Proctor is a 64 y.o. female presenting on 12/10/2019 for Abdominal Pain (L sided abd pain for about a week. pt drank magnesium citrate last night and had no improvement. pt took and aleve this morning and has also not helped. pt states is she is lying or sitting for too long it hurts more. pt states pain is not as bad. pt rates pain a 7)   HPI   Pt had a negative covid 19 screening questionnaire.   Chief Complaint  Patient presents with  . Abdominal Pain    L sided abd pain for about a week. pt drank magnesium citrate last night and had no improvement. pt took and aleve this morning and has also not helped. pt states is she is lying or sitting for too long it hurts more. pt states pain is not as bad. pt rates pain a 7      Pt thinks she has a bowel obstruction.  She says she had one in the past.  She says she was treated by getting a colonoscopy.  Records from that procedure are unavailable (requested from [facility] but not yet received).    She says she Moved her bowels over the weekend after mag citrate. She drank mag citrate again last night and she "emptied her bowels out" / had lot of BMs.  She says her LUQ pain is bad.  She has No emesis.  She is having a lot of nausea.    Last time she ate was before 6pm last night- ate a sandwich   Relevant past medical, surgical, family and social history reviewed and updated as indicated. Interim medical history since our last visit reviewed. Allergies and medications reviewed and updated.   Current Outpatient Medications:  .  amLODipine (NORVASC) 10 MG tablet, Take 1 tablet (10 mg total) by mouth daily., Disp: 90 tablet, Rfl: 1 .  atorvastatin (LIPITOR) 20 MG tablet, Take 1 tablet (20 mg total)  by mouth daily., Disp: 90 tablet, Rfl: 1 .  sertraline (ZOLOFT) 25 MG tablet, Take 1 tablet (25 mg total) by mouth daily., Disp: 90 tablet, Rfl: 1    Review of Systems  Per HPI unless specifically indicated above     Objective:    BP 126/70   Pulse 76   Temp 97.7 F (36.5 C)   Ht 5' 3.25" (1.607 m)   Wt 126 lb (57.2 kg)   SpO2 93%   BMI 22.14 kg/m   Wt Readings from Last 3 Encounters:  12/10/19 126 lb (57.2 kg)  12/02/19 127 lb (57.6 kg)  07/06/19 138 lb 4 oz (62.7 kg)    Physical Exam Vitals reviewed.  Constitutional:      Appearance: She is well-developed. She is not toxic-appearing.  HENT:     Head: Normocephalic and atraumatic.  Cardiovascular:     Rate and Rhythm: Normal rate and regular rhythm.  Pulmonary:     Effort: Pulmonary effort is normal.     Breath sounds: Normal breath sounds.  Abdominal:     General: Bowel sounds are normal.     Palpations: Abdomen is soft. There is  no mass.     Tenderness: There is abdominal tenderness in the left upper quadrant. There is no guarding or rebound.  Musculoskeletal:     Cervical back: Neck supple.  Lymphadenopathy:     Cervical: No cervical adenopathy.  Skin:    General: Skin is warm and dry.  Neurological:     Mental Status: She is alert and oriented to person, place, and time.  Psychiatric:        Attention and Perception: Attention normal.        Speech: Speech normal.        Behavior: Behavior normal. Behavior is cooperative.           Assessment & Plan:    Encounter Diagnoses  Name Primary?  . LUQ abdominal pain Yes  . Nausea      Pt is given phenergan 25mg  IM in the office (her son is driving).  She is sent for CT to be done stat.  Discussed ER but pt doesn't want to do that which is understandable in light of covid causing excessive wait times.  CT report is to be called to provider.

## 2019-12-10 NOTE — Telephone Encounter (Signed)
OV made and patient is aware

## 2019-12-10 NOTE — Telephone Encounter (Signed)
[facility] called to say patient has an abnormal CT and could we schedule her ASAP. Can we accept her as a new patient? Referral is in epic.

## 2019-12-10 NOTE — Telephone Encounter (Signed)
Ok to schedule ov.  

## 2019-12-12 NOTE — Progress Notes (Deleted)
Referring Provider: Soyla Dryer, PA-C Primary Care Physician:  Soyla Dryer, PA-C Primary Gastroenterologist:  Dr. Rayne Du chief complaint on file.   HPI:   Tammy Proctor is a 64 y.o. female presenting today at the request of Soyla Dryer, PA-C for LLQ abdominal pain, abnormal CT, colon mass.  CT A/P with contrast 12/10/2019:  IMPRESSION: 1. Appearance of the left kidney which is favored to represent a chronic ureteropelvic junction obstruction. No other explanation for left-sided pain. 2. Colonic diverticulosis without evidence of diverticulitis. 3. 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 No obstruction or evidence of abdominal metastatic disease. 4. Apparent left labial soft tissue fullness, possibly related to pelvic floor laxity. Consider physical exam correlation. 5. Aortic atherosclerosis (ICD10-I70.0) and emphysema (ICD10-J43.9). 6. Pancreas divisum or variant.  Today:     CBC   Past Medical History:  Diagnosis Date  . Anxiety   . Hypertension   . Migraine     Past Surgical History:  Procedure Laterality Date  . BREAST EXCISIONAL BIOPSY Right    benign  . CATARACT EXTRACTION Right   . NASAL SINUS SURGERY    . TUBAL LIGATION      Current Outpatient Medications  Medication Sig Dispense Refill  . amLODipine (NORVASC) 10 MG tablet Take 1 tablet (10 mg total) by mouth daily. 90 tablet 1  . atorvastatin (LIPITOR) 20 MG tablet Take 1 tablet (20 mg total) by mouth daily. 90 tablet 1  . promethazine (PHENERGAN) 25 MG tablet 1/2 - 1 po q 8 hr prn n/v 20 tablet 0  . sertraline (ZOLOFT) 25 MG tablet Take 1 tablet (25 mg total) by mouth daily. 90 tablet 1   No current facility-administered medications for this visit.    Allergies as of 12/14/2019 - Review Complete 12/10/2019  Allergen Reaction Noted  . Ceclor [cefaclor] Hives 07/06/2013    Family History  Problem Relation Age of Onset  . Cancer Mother        breast    . Diabetes Mother   . Heart disease Father   . Mental illness Father   . Heart disease Sister   . Diabetes Brother   . Diabetes Sister     Social History   Socioeconomic History  . Marital status: Divorced    Spouse name: Not on file  . Number of children: 3  . Years of education: Not on file  . Highest education level: Associate degree: academic program  Occupational History  . Not on file  Tobacco Use  . Smoking status: Current Every Day Smoker    Packs/day: 0.25    Years: 52.00    Pack years: 13.00    Types: Cigarettes    Start date: 08/12/1974  . Smokeless tobacco: Never Used  Vaping Use  . Vaping Use: Never used  Substance and Sexual Activity  . Alcohol use: Yes    Comment: occasional  . Drug use: Yes    Types: Marijuana    Comment: once per month  . Sexual activity: Not Currently  Other Topics Concern  . Not on file  Social History Narrative  . Not on file   Social Determinants of Health   Financial Resource Strain:   . Difficulty of Paying Living Expenses: Not on file  Food Insecurity:   . Worried About Charity fundraiser in the Last Year: Not on file  . Ran Out of Food in the Last Year: Not on file  Transportation Needs:   . Lack  of Transportation (Medical): Not on file  . Lack of Transportation (Non-Medical): Not on file  Physical Activity:   . Days of Exercise per Week: Not on file  . Minutes of Exercise per Session: Not on file  Stress:   . Feeling of Stress : Not on file  Social Connections:   . Frequency of Communication with Friends and Family: Not on file  . Frequency of Social Gatherings with Friends and Family: Not on file  . Attends Religious Services: Not on file  . Active Member of Clubs or Organizations: Not on file  . Attends Archivist Meetings: Not on file  . Marital Status: Not on file  Intimate Partner Violence:   . Fear of Current or Ex-Partner: Not on file  . Emotionally Abused: Not on file  . Physically Abused:  Not on file  . Sexually Abused: Not on file    Review of Systems: Gen: Denies any fever, chills, fatigue, weight loss, lack of appetite.  CV: Denies chest pain, heart palpitations, peripheral edema, syncope.  Resp: Denies shortness of breath at rest or with exertion. Denies wheezing or cough.  GI: Denies dysphagia or odynophagia. Denies jaundice, hematemesis, fecal incontinence. GU : Denies urinary burning, urinary frequency, urinary hesitancy MS: Denies joint pain, muscle weakness, cramps, or limitation of movement.  Derm: Denies rash, itching, dry skin Psych: Denies depression, anxiety, memory loss, and confusion Heme: Denies bruising, bleeding, and enlarged lymph nodes.  Physical Exam: There were no vitals taken for this visit. General:   Alert and oriented. Pleasant and cooperative. Well-nourished and well-developed.  Head:  Normocephalic and atraumatic. Eyes:  Without icterus, sclera clear and conjunctiva pink.  Ears:  Normal auditory acuity. Nose:  No deformity, discharge,  or lesions. Mouth:  No deformity or lesions, oral mucosa pink.  Neck:  Supple, without mass or thyromegaly. Lungs:  Clear to auscultation bilaterally. No wheezes, rales, or rhonchi. No distress.  Heart:  S1, S2 present without murmurs appreciated.  Abdomen:  +BS, soft, non-tender and non-distended. No HSM noted. No guarding or rebound. No masses appreciated.  Rectal:  Deferred  Msk:  Symmetrical without gross deformities. Normal posture. Pulses:  Normal pulses noted. Extremities:  Without clubbing or edema. Neurologic:  Alert and  oriented x4;  grossly normal neurologically. Skin:  Intact without significant lesions or rashes. Cervical Nodes:  No significant cervical adenopathy. Psych:  Alert and cooperative. Normal mood and affect.

## 2019-12-14 ENCOUNTER — Ambulatory Visit: Payer: Self-pay | Admitting: Gastroenterology

## 2019-12-29 ENCOUNTER — Other Ambulatory Visit: Payer: Self-pay | Admitting: Physician Assistant

## 2020-01-16 NOTE — H&P (View-Only) (Signed)
Referring Provider: Soyla Dryer, PA-C Primary Care Physician:  Soyla Dryer, PA-C Primary Gastroenterologist:  Dr. Abbey Chatters  Chief Complaint  Patient presents with  . abnormal CT    HPI:   Tammy Proctor is a 64 y.o. female presenting today at the request of Soyla Dryer, PA-C for abnormal CT, colonic mass, and LUQ abdominal pain.   Reviewed recent office visit with Soyla Dryer, PA-C on 12/10/2019.  Patient reported left-sided abdominal pain x1 week.  She is taking mag citrate to clean her bowels out.  Having a lot of nausea but no emesis.  Stat CT was ordered.   CT A/P with contrast 12/10/19:  IMPRESSION: 1. Appearance of the left kidney which is favored to represent a chronic ureteropelvic junction obstruction. No other explanation for left-sided pain. 2. Colonic diverticulosis without evidence of diverticulitis. 3. 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 No obstruction or evidence of abdominal metastatic disease. 4. Apparent left labial soft tissue fullness, possibly related to pelvic floor laxity. Consider physical exam correlation. 5. Aortic atherosclerosis (ICD10-I70.0) and emphysema (ICD10-J43.9). 6. Pancreas divisum or variant.  Today:  Back in October, she was having constipation and left sided abdominal pain. Has had an intestinal blockage in the past and it felt similar which is why she went to her PCP.  Took mag citrate to get bowels moving. Now her bowels are moving well with one soft, formed BM daily.  Not taking anything to help with bowel regularity.  No blood in the stool. No black stools. Continues with mild intermittent nausea which is chronic for years. States she has a weak stomach and anxiety which influences this. Nausea is triggered by "certain smells". No vomiting. Only had heart burn when she was pregnant. No trouble since then. No dysphagia.   Lost her brother about 1 month ago. Has not been able to eat well with  this. Weight has been up and down.   Had a colonoscopy years ago at [facility]. No polyps.   No family history of colon cancer.  Past Medical History:  Diagnosis Date  . Anxiety   . HLD (hyperlipidemia)   . Hypertension   . Migraine    resolved    Past Surgical History:  Procedure Laterality Date  . BREAST EXCISIONAL BIOPSY Right    benign  . CATARACT EXTRACTION Right   . NASAL SINUS SURGERY    . TUBAL LIGATION      Current Outpatient Medications  Medication Sig Dispense Refill  . amLODipine (NORVASC) 10 MG tablet Take 1 tablet (10 mg total) by mouth daily. 90 tablet 1  . atorvastatin (LIPITOR) 20 MG tablet Take 1 tablet (20 mg total) by mouth daily. 90 tablet 1  . promethazine (PHENERGAN) 25 MG tablet 1/2 - 1 po q 8 hr prn n/v 20 tablet 0  . sertraline (ZOLOFT) 25 MG tablet Take 1 tablet (25 mg total) by mouth daily. (Patient taking differently: Take 50 mg by mouth daily. ) 90 tablet 1   No current facility-administered medications for this visit.    Allergies as of 01/18/2020 - Review Complete 01/18/2020  Allergen Reaction Noted  . Ceclor [cefaclor] Hives 07/06/2013    Family History  Problem Relation Age of Onset  . Cancer Mother        breast  . Diabetes Mother   . Heart disease Father   . Mental illness Father   . Heart disease Sister   . Diabetes Brother   . Diabetes Sister   .  Colon cancer Neg Hx     Social History   Socioeconomic History  . Marital status: Divorced    Spouse name: Not on file  . Number of children: 3  . Years of education: Not on file  . Highest education level: Associate degree: academic program  Occupational History  . Not on file  Tobacco Use  . Smoking status: Current Every Day Smoker    Packs/day: 0.25    Years: 52.00    Pack years: 13.00    Types: Cigarettes    Start date: 08/12/1974  . Smokeless tobacco: Never Used  Vaping Use  . Vaping Use: Never used  Substance and Sexual Activity  . Alcohol use: Yes    Comment:  occasional  . Drug use: Yes    Types: Marijuana    Comment: once per month  . Sexual activity: Not Currently  Other Topics Concern  . Not on file  Social History Narrative  . Not on file   Social Determinants of Health   Financial Resource Strain:   . Difficulty of Paying Living Expenses: Not on file  Food Insecurity:   . Worried About Charity fundraiser in the Last Year: Not on file  . Ran Out of Food in the Last Year: Not on file  Transportation Needs:   . Lack of Transportation (Medical): Not on file  . Lack of Transportation (Non-Medical): Not on file  Physical Activity:   . Days of Exercise per Week: Not on file  . Minutes of Exercise per Session: Not on file  Stress:   . Feeling of Stress : Not on file  Social Connections:   . Frequency of Communication with Friends and Family: Not on file  . Frequency of Social Gatherings with Friends and Family: Not on file  . Attends Religious Services: Not on file  . Active Member of Clubs or Organizations: Not on file  . Attends Archivist Meetings: Not on file  . Marital Status: Not on file  Intimate Partner Violence:   . Fear of Current or Ex-Partner: Not on file  . Emotionally Abused: Not on file  . Physically Abused: Not on file  . Sexually Abused: Not on file    Review of Systems: Gen: Denies any fever.  Intermittent chills which is chronic.  Occasional dizziness with position changes.  No presyncope or syncope.  CV: Denies chest pain or palpitations. 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  Derm: Denies rash Psych: Admits to anxiety Heme: See HPI  Physical Exam: BP (!) 144/77   Pulse 84   Temp (!) 96.9 F (36.1 C)   Ht 5\' 3"  (1.6 m)   Wt 125 lb (56.7 kg)   BMI 22.14 kg/m  General:   Alert and oriented. Pleasant and cooperative. Well-nourished and well-developed.  Head:   Normocephalic and atraumatic. Eyes:  Without icterus, sclera clear and conjunctiva pink.  Ears:  Normal auditory acuity. Lungs:  Clear to auscultation bilaterally. No wheezes, rales, or rhonchi. No distress.  Heart:  S1, S2 present without murmurs appreciated.  Abdomen:  +BS, soft, non-tender and non-distended. No HSM noted. No guarding or rebound. No masses appreciated.  Rectal:  Deferred  Msk:  Symmetrical without gross deformities. Normal posture. Extremities:  Without edema. Neurologic:  Alert and  oriented x4;  grossly normal neurologically. Skin:  Intact without significant lesions or rashes Psych:  Normal mood and affect.

## 2020-01-16 NOTE — Progress Notes (Signed)
Referring Provider: Soyla Dryer, PA-C Primary Care Physician:  Soyla Dryer, PA-C Primary Gastroenterologist:  Dr. Abbey Chatters  Chief Complaint  Patient presents with  . abnormal CT    HPI:   Tammy Proctor is a 64 y.o. female presenting today at the request of Soyla Dryer, PA-C for abnormal CT, colonic mass, and LUQ abdominal pain.   Reviewed recent office visit with Soyla Dryer, PA-C on 12/10/2019.  Patient reported left-sided abdominal pain x1 week.  She is taking mag citrate to clean her bowels out.  Having a lot of nausea but no emesis.  Stat CT was ordered.   CT A/P with contrast 12/10/19:  IMPRESSION: 1. Appearance of the left kidney which is favored to represent a chronic ureteropelvic junction obstruction. No other explanation for left-sided pain. 2. Colonic diverticulosis without evidence of diverticulitis. 3. 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 No obstruction or evidence of abdominal metastatic disease. 4. Apparent left labial soft tissue fullness, possibly related to pelvic floor laxity. Consider physical exam correlation. 5. Aortic atherosclerosis (ICD10-I70.0) and emphysema (ICD10-J43.9). 6. Pancreas divisum or variant.  Today:  Back in October, she was having constipation and left sided abdominal pain. Has had an intestinal blockage in the past and it felt similar which is why she went to her PCP.  Took mag citrate to get bowels moving. Now her bowels are moving well with one soft, formed BM daily.  Not taking anything to help with bowel regularity.  No blood in the stool. No black stools. Continues with mild intermittent nausea which is chronic for years. States she has a weak stomach and anxiety which influences this. Nausea is triggered by "certain smells". No vomiting. Only had heart burn when she was pregnant. No trouble since then. No dysphagia.   Lost her brother about 1 month ago. Has not been able to eat well with  this. Weight has been up and down.   Had a colonoscopy years ago at [facility]. No polyps.   No family history of colon cancer.  Past Medical History:  Diagnosis Date  . Anxiety   . HLD (hyperlipidemia)   . Hypertension   . Migraine    resolved    Past Surgical History:  Procedure Laterality Date  . BREAST EXCISIONAL BIOPSY Right    benign  . CATARACT EXTRACTION Right   . NASAL SINUS SURGERY    . TUBAL LIGATION      Current Outpatient Medications  Medication Sig Dispense Refill  . amLODipine (NORVASC) 10 MG tablet Take 1 tablet (10 mg total) by mouth daily. 90 tablet 1  . atorvastatin (LIPITOR) 20 MG tablet Take 1 tablet (20 mg total) by mouth daily. 90 tablet 1  . promethazine (PHENERGAN) 25 MG tablet 1/2 - 1 po q 8 hr prn n/v 20 tablet 0  . sertraline (ZOLOFT) 25 MG tablet Take 1 tablet (25 mg total) by mouth daily. (Patient taking differently: Take 50 mg by mouth daily. ) 90 tablet 1   No current facility-administered medications for this visit.    Allergies as of 01/18/2020 - Review Complete 01/18/2020  Allergen Reaction Noted  . Ceclor [cefaclor] Hives 07/06/2013    Family History  Problem Relation Age of Onset  . Cancer Mother        breast  . Diabetes Mother   . Heart disease Father   . Mental illness Father   . Heart disease Sister   . Diabetes Brother   . Diabetes Sister   .  Colon cancer Neg Hx     Social History   Socioeconomic History  . Marital status: Divorced    Spouse name: Not on file  . Number of children: 3  . Years of education: Not on file  . Highest education level: Associate degree: academic program  Occupational History  . Not on file  Tobacco Use  . Smoking status: Current Every Day Smoker    Packs/day: 0.25    Years: 52.00    Pack years: 13.00    Types: Cigarettes    Start date: 08/12/1974  . Smokeless tobacco: Never Used  Vaping Use  . Vaping Use: Never used  Substance and Sexual Activity  . Alcohol use: Yes    Comment:  occasional  . Drug use: Yes    Types: Marijuana    Comment: once per month  . Sexual activity: Not Currently  Other Topics Concern  . Not on file  Social History Narrative  . Not on file   Social Determinants of Health   Financial Resource Strain:   . Difficulty of Paying Living Expenses: Not on file  Food Insecurity:   . Worried About Charity fundraiser in the Last Year: Not on file  . Ran Out of Food in the Last Year: Not on file  Transportation Needs:   . Lack of Transportation (Medical): Not on file  . Lack of Transportation (Non-Medical): Not on file  Physical Activity:   . Days of Exercise per Week: Not on file  . Minutes of Exercise per Session: Not on file  Stress:   . Feeling of Stress : Not on file  Social Connections:   . Frequency of Communication with Friends and Family: Not on file  . Frequency of Social Gatherings with Friends and Family: Not on file  . Attends Religious Services: Not on file  . Active Member of Clubs or Organizations: Not on file  . Attends Archivist Meetings: Not on file  . Marital Status: Not on file  Intimate Partner Violence:   . Fear of Current or Ex-Partner: Not on file  . Emotionally Abused: Not on file  . Physically Abused: Not on file  . Sexually Abused: Not on file    Review of Systems: Gen: Denies any fever.  Intermittent chills which is chronic.  Occasional dizziness with position changes.  No presyncope or syncope.  CV: Denies chest pain or palpitations. 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  Derm: Denies rash Psych: Admits to anxiety Heme: See HPI  Physical Exam: BP (!) 144/77   Pulse 84   Temp (!) 96.9 F (36.1 C)   Ht 5\' 3"  (1.6 m)   Wt 125 lb (56.7 kg)   BMI 22.14 kg/m  General:   Alert and oriented. Pleasant and cooperative. Well-nourished and well-developed.  Head:   Normocephalic and atraumatic. Eyes:  Without icterus, sclera clear and conjunctiva pink.  Ears:  Normal auditory acuity. Lungs:  Clear to auscultation bilaterally. No wheezes, rales, or rhonchi. No distress.  Heart:  S1, S2 present without murmurs appreciated.  Abdomen:  +BS, soft, non-tender and non-distended. No HSM noted. No guarding or rebound. No masses appreciated.  Rectal:  Deferred  Msk:  Symmetrical without gross deformities. Normal posture. Extremities:  Without edema. Neurologic:  Alert and  oriented x4;  grossly normal neurologically. Skin:  Intact without significant lesions or rashes Psych:  Normal mood and affect.

## 2020-01-18 ENCOUNTER — Other Ambulatory Visit: Payer: Self-pay

## 2020-01-18 ENCOUNTER — Encounter: Payer: Self-pay | Admitting: Gastroenterology

## 2020-01-18 ENCOUNTER — Ambulatory Visit (INDEPENDENT_AMBULATORY_CARE_PROVIDER_SITE_OTHER): Payer: Self-pay | Admitting: Gastroenterology

## 2020-01-18 DIAGNOSIS — R933 Abnormal findings on diagnostic imaging of other parts of digestive tract: Secondary | ICD-10-CM | POA: Insufficient documentation

## 2020-01-18 NOTE — Patient Instructions (Signed)
We will get you scheduled for a colonoscopy with Dr. Abbey Chatters in the near future.   We will plan to follow-up with you after your colonoscopy.   It was good to meet you today! I hope you have a wonderful Thanksgiving!  Aliene Altes, PA-C [facility]

## 2020-01-18 NOTE — Assessment & Plan Note (Addendum)
64 year old female presenting today for further evaluation of abnormal CT of the colon.  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  CT had been ordered due to left-sided abdominal pain and constipation.  The symptoms have resolved and bowels are moving daily.  Documented weights are down 13 pounds over the last 6 months.  Patient attributes some of this to dealing with grief secondary to losing her brother about 1 month ago.  Denies BRBPR or melena.  No family history of colon cancer.  Patient reports having a colonoscopy years ago at [facility] when she had a "bowel obstruction".  Denies polyps.  Plan: Proceed with colonoscopy with propofol with Dr. Abbey Chatters ASAP. The risks, benefits, and alternatives have been discussed with the patient in detail. The patient states understanding and desires to proceed.  ASA II Follow-up after procedure.

## 2020-01-20 ENCOUNTER — Other Ambulatory Visit: Payer: Self-pay

## 2020-01-20 ENCOUNTER — Other Ambulatory Visit (HOSPITAL_COMMUNITY)
Admission: RE | Admit: 2020-01-20 | Discharge: 2020-01-20 | Disposition: A | Discharge status: Home / Self Care | Payer: Self-pay | Source: Ambulatory Visit | Attending: Internal Medicine | Admitting: Internal Medicine

## 2020-01-20 DIAGNOSIS — Z01812 Encounter for preprocedural laboratory examination: Secondary | ICD-10-CM | POA: Insufficient documentation

## 2020-01-20 DIAGNOSIS — Z20822 Contact with and (suspected) exposure to covid-19: Secondary | ICD-10-CM | POA: Insufficient documentation

## 2020-01-20 LAB — SARS CORONAVIRUS 2 (TAT 6-24 HRS): SARS Coronavirus 2: NEGATIVE

## 2020-01-22 ENCOUNTER — Telehealth: Payer: Self-pay | Admitting: Physician Assistant

## 2020-01-22 NOTE — Telephone Encounter (Signed)
Patient is calling to receive her covid results.Patient expressed understanding.

## 2020-01-25 ENCOUNTER — Encounter (HOSPITAL_COMMUNITY): Payer: Self-pay

## 2020-01-25 ENCOUNTER — Encounter (HOSPITAL_COMMUNITY)
Admission: RE | Disposition: A | Discharge status: Home / Self Care | Payer: Self-pay | Source: Home / Self Care | Attending: Internal Medicine

## 2020-01-25 ENCOUNTER — Ambulatory Visit (HOSPITAL_COMMUNITY)
Admission: RE | Admit: 2020-01-25 | Discharge: 2020-01-25 | Disposition: A | Discharge status: Home / Self Care | Payer: Self-pay | Attending: Internal Medicine | Admitting: Internal Medicine

## 2020-01-25 ENCOUNTER — Other Ambulatory Visit: Payer: Self-pay

## 2020-01-25 ENCOUNTER — Ambulatory Visit (HOSPITAL_COMMUNITY): Payer: Self-pay | Admitting: Anesthesiology

## 2020-01-25 DIAGNOSIS — K5730 Diverticulosis of large intestine without perforation or abscess without bleeding: Secondary | ICD-10-CM | POA: Insufficient documentation

## 2020-01-25 DIAGNOSIS — F17210 Nicotine dependence, cigarettes, uncomplicated: Secondary | ICD-10-CM | POA: Insufficient documentation

## 2020-01-25 DIAGNOSIS — Z79899 Other long term (current) drug therapy: Secondary | ICD-10-CM | POA: Insufficient documentation

## 2020-01-25 DIAGNOSIS — D490 Neoplasm of unspecified behavior of digestive system: Secondary | ICD-10-CM

## 2020-01-25 DIAGNOSIS — D123 Benign neoplasm of transverse colon: Secondary | ICD-10-CM | POA: Insufficient documentation

## 2020-01-25 DIAGNOSIS — K648 Other hemorrhoids: Secondary | ICD-10-CM | POA: Insufficient documentation

## 2020-01-25 DIAGNOSIS — R933 Abnormal findings on diagnostic imaging of other parts of digestive tract: Secondary | ICD-10-CM | POA: Insufficient documentation

## 2020-01-25 HISTORY — PX: COLONOSCOPY WITH PROPOFOL: SHX5780

## 2020-01-25 HISTORY — PX: BIOPSY: SHX5522

## 2020-01-25 SURGERY — COLONOSCOPY WITH PROPOFOL
Anesthesia: General

## 2020-01-25 MED ORDER — SPOT INK MARKER SYRINGE KIT
PACK | SUBMUCOSAL | Status: DC | PRN
  Administered 2020-01-25: 4.5 mL via SUBMUCOSAL

## 2020-01-25 MED ORDER — STERILE WATER FOR IRRIGATION IR SOLN
Status: DC | PRN
  Administered 2020-01-25: 1.5 mL

## 2020-01-25 MED ORDER — GLYCOPYRROLATE 0.2 MG/ML IJ SOLN
INTRAMUSCULAR | Status: AC
  Filled 2020-01-25: qty 1

## 2020-01-25 MED ORDER — LIDOCAINE HCL (CARDIAC) PF 50 MG/5ML IV SOSY
PREFILLED_SYRINGE | INTRAVENOUS | Status: DC | PRN
  Administered 2020-01-25: 50 mg via INTRAVENOUS

## 2020-01-25 MED ORDER — SPOT INK MARKER SYRINGE KIT
PACK | SUBMUCOSAL | Status: AC
  Filled 2020-01-25: qty 5

## 2020-01-25 MED ORDER — GLYCOPYRROLATE 0.2 MG/ML IJ SOLN
INTRAMUSCULAR | Status: DC | PRN
  Administered 2020-01-25: .2 mg via INTRAVENOUS

## 2020-01-25 MED ORDER — LACTATED RINGERS IV SOLN: INTRAVENOUS | Status: DC

## 2020-01-25 MED ORDER — PROPOFOL 10 MG/ML IV BOLUS
INTRAVENOUS | Status: DC | PRN
  Administered 2020-01-25: 40 mg via INTRAVENOUS
  Administered 2020-01-25: 50 mg via INTRAVENOUS
  Administered 2020-01-25: 100 mg via INTRAVENOUS
  Administered 2020-01-25: 40 mg via INTRAVENOUS

## 2020-01-25 MED ORDER — PROPOFOL 500 MG/50ML IV EMUL
INTRAVENOUS | Status: DC | PRN
  Administered 2020-01-25: 150 ug/kg/min via INTRAVENOUS
  Administered 2020-01-25: 100 ug/kg/min via INTRAVENOUS
  Administered 2020-01-25: 125 ug/kg/min via INTRAVENOUS

## 2020-01-25 NOTE — Op Note (Signed)
[facility] Patient Name: Tammy Proctor Procedure Date: 01/25/2020 9:37 AM MRN: [id number] Date of Birth: 02-12-1956 Attending MD: Elon Alas. Abbey Chatters DO CSN: [id number] Age: 64 Admit Type: Outpatient Procedure:                Colonoscopy Indications:              Abnormal CT of the GI tract Providers:                Elon Alas. Abbey Chatters, DO, Caprice Kluver, Casimer Bilis, Technician Referring MD:              Medicines:                See the Anesthesia note for documentation of the                            administered medications Complications:            No immediate complications. Estimated Blood Loss:     Estimated blood loss was minimal. Procedure:                Pre-Anesthesia Assessment:                           - The anesthesia plan was to use monitored                            anesthesia care (MAC).                           After obtaining informed consent, the colonoscope                            was passed under direct vision. Throughout the                            procedure, the patient's blood pressure, pulse, and                            oxygen saturations were monitored continuously. The                            [id number]-HQ190L ([id number]) scope was introduced through                            the anus and advanced to the the cecum, identified                            by appendiceal orifice and ileocecal valve. The                            colonoscopy was technically difficult and complex                            due to inadequate bowel prep,  a redundant colon and                            significant looping. The patient tolerated the                            procedure well. The quality of the bowel                            preparation was evaluated using the BBPS [facility]                            Bowel Preparation Scale) with scores of: Right                            Colon = 1 (portion of mucosa seen, but  other areas                            not well seen due to staining, residual stool                            and/or opaque liquid), Transverse Colon = 1                            (portion of mucosa seen, but other areas not well                            seen due to staining, residual stool and/or opaque                            liquid) and Left Colon = 1 (portion of mucosa seen,                            but other areas not well seen due to staining,                            residual stool and/or opaque liquid). The total                            BBPS score equals 3. The quality of the bowel                            preparation was inadequate. Scope In: 10:51:13 AM Scope Out: 11:25:44 AM Scope Withdrawal Time: 0 hours 19 minutes 15 seconds  Total Procedure Duration: 0 hours 34 minutes 31 seconds  Findings:      The perianal and digital rectal examinations were normal.      Non-bleeding internal hemorrhoids were found during endoscopy.      Multiple small-mouthed diverticula were found in the sigmoid colon and       descending colon.      A submucosal and ulcerated non-obstructing mass was found in the       transverse colon. The mass was partially circumferential (involving  one-third of the lumen circumference). No bleeding was present. Mucosa       was biopsied with a cold forceps for histology. One specimen bottle was       sent to pathology. 3 quadrant area distal to mass was tattooed with an       injection of 5 mL of Spot (carbon black).      A large amount of stool was found in the entire colon, precluding       visualization. Lavage of the area was performed using copious amounts of       sterile water, resulting in incomplete clearance with continued poor       visualization. Impression:               - Preparation of the colon was inadequate.                           - Non-bleeding internal hemorrhoids.                           - Diverticulosis in the  sigmoid colon and in the                            descending colon.                           - Rule out malignancy, tumor in the transverse                            colon. Biopsied. Tattooed.                           - Stool in the entire examined colon. Moderate Sedation:      Per Anesthesia Care Recommendation:           - Patient has a contact number available for                            emergencies. The signs and symptoms of potential                            delayed complications were discussed with the                            patient. Return to normal activities tomorrow.                            Written discharge instructions were provided to the                            patient.                           - Resume previous diet.                           - Continue present medications.                           -  Await pathology results.                           - Repeat colonoscopy at the next available                            appointment because the bowel preparation was poor.                            Further recommendations after pathology reviewed. Procedure Code(s):        --- Professional ---                           [phone], Colonoscopy, flexible; with directed                            submucosal injection(s), any substance Diagnosis Code(s):        --- Professional ---                           K64.8, Other hemorrhoids                           D49.0, Neoplasm of unspecified behavior of                            digestive system                           K57.30, Diverticulosis of large intestine without                            perforation or abscess without bleeding                           R93.3, Abnormal findings on diagnostic imaging of                            other parts of digestive tract CPT copyright 2019 American Medical Association. All rights reserved. The codes documented in this report are preliminary and upon coder review may   be revised to meet current compliance requirements. Elon Alas. Abbey Chatters, DO [place] Abbey Chatters, DO 01/25/2020 11:41:27 AM This report has been signed electronically. Number of Addenda: 0

## 2020-01-25 NOTE — Transfer of Care (Signed)
Immediate Anesthesia Transfer of Care Note  Patient: RAENA PAU  Procedure(s) Performed: COLONOSCOPY WITH PROPOFOL (N/A ) BIOPSY  Patient Location: Endoscopy Unit  Anesthesia Type:General  Level of Consciousness: awake, alert  and patient cooperative  Airway & Oxygen Therapy: Patient Spontanous Breathing  Post-op Assessment: Report given to RN and Post -op Vital signs reviewed and stable  Post vital signs: Reviewed and stable  Last Vitals:  Vitals Value Taken Time  BP    Temp    Pulse    Resp    SpO2      Last Pain:  Vitals:   01/25/20 1045  TempSrc:   PainSc: 0-No pain         Complications: No complications documented.

## 2020-01-25 NOTE — Discharge Instructions (Addendum)
  Colonoscopy Discharge Instructions  Read the instructions outlined below and refer to this sheet in the next few weeks. These discharge instructions provide you with general information on caring for yourself after you leave the hospital. Your doctor may also give you specific instructions. While your treatment has been planned according to the most current medical practices available, unavoidable complications occasionally occur.   ACTIVITY  You may resume your regular activity, but move at a slower pace for the next 24 hours.   Take frequent rest periods for the next 24 hours.   Walking will help get rid of the air and reduce the bloated feeling in your belly (abdomen).   No driving for 24 hours (because of the medicine (anesthesia) used during the test).    Do not sign any important legal documents or operate any machinery for 24 hours (because of the anesthesia used during the test).  NUTRITION  Drink plenty of fluids.   You may resume your normal diet as instructed by your doctor.   Begin with a light meal and progress to your normal diet. Heavy or fried foods are harder to digest and may make you feel sick to your stomach (nauseated).   Avoid alcoholic beverages for 24 hours or as instructed.  MEDICATIONS  You may resume your normal medications unless your doctor tells you otherwise.  WHAT YOU CAN EXPECT TODAY  Some feelings of bloating in the abdomen.   Passage of more gas than usual.   Spotting of blood in your stool or on the toilet paper.  IF YOU HAD POLYPS REMOVED DURING THE COLONOSCOPY:  No aspirin products for 7 days or as instructed.   No alcohol for 7 days or as instructed.   Eat a soft diet for the next 24 hours.  FINDING OUT THE RESULTS OF YOUR TEST Not all test results are available during your visit. If your test results are not back during the visit, make an appointment with your caregiver to find out the results. Do not assume everything is normal if  you have not heard from your caregiver or the medical facility. It is important for you to follow up on all of your test results.  SEEK IMMEDIATE MEDICAL ATTENTION IF:  You have more than a spotting of blood in your stool.   Your belly is swollen (abdominal distention).   You are nauseated or vomiting.   You have a temperature over 101.   You have abdominal pain or discomfort that is severe or gets worse throughout the day.   Your colonoscopy revealed a small ulcerated mass which I biopsied.  Depending on these results we may need to refer you for surgery.  We should get these results back in the next 1 to 2 days and I will contact you.  Unfortunately, the colon preparation was not ideal so we will need to repeat colonoscopy in the near future.  We can discuss this once I have your pathology results back.  Follow-up with GI to be determined.  I hope you have a great rest of your week!  Elon Alas. Abbey Chatters, D.O. Gastroenterology and Hepatology [facility]

## 2020-01-25 NOTE — Interval H&P Note (Signed)
History and Physical Interval Note:  01/25/2020 10:23 AM  Tammy Proctor  has presented today for surgery, with the diagnosis of abnormal CT colon.  The various methods of treatment have been discussed with the patient and family. After consideration of risks, benefits and other options for treatment, the patient has consented to  Procedure(s) with comments: COLONOSCOPY WITH PROPOFOL (N/A) - 9:00am as a surgical intervention.  The patient's history has been reviewed, patient examined, no change in status, stable for surgery.  I have reviewed the patient's chart and labs.  Questions were answered to the patient's satisfaction.     Eloise Harman

## 2020-01-25 NOTE — Anesthesia Preprocedure Evaluation (Signed)
Anesthesia Evaluation  Patient identified by MRN, date of birth, ID band Patient awake    Reviewed: Allergy & Precautions, H&P , NPO status , Patient's Chart, lab work & pertinent test results, reviewed documented beta blocker date and time   Airway Mallampati: II  TM Distance: >3 FB Neck ROM: full    Dental no notable dental hx.    Pulmonary neg pulmonary ROS, Current Smoker,    Pulmonary exam normal breath sounds clear to auscultation       Cardiovascular Exercise Tolerance: Good hypertension, negative cardio ROS   Rhythm:regular Rate:Normal     Neuro/Psych  Headaches, PSYCHIATRIC DISORDERS Anxiety Depression    GI/Hepatic negative GI ROS, Neg liver ROS,   Endo/Other  negative endocrine ROS  Renal/GU negative Renal ROS  negative genitourinary   Musculoskeletal   Abdominal   Peds  Hematology negative hematology ROS (+)   Anesthesia Other Findings   Reproductive/Obstetrics negative OB ROS                             Anesthesia Physical Anesthesia Plan  ASA: III  Anesthesia Plan: General   Post-op Pain Management:    Induction:   PONV Risk Score and Plan: Propofol infusion  Airway Management Planned:   Additional Equipment:   Intra-op Plan:   Post-operative Plan:   Informed Consent: I have reviewed the patients History and Physical, chart, labs and discussed the procedure including the risks, benefits and alternatives for the proposed anesthesia with the patient or authorized representative who has indicated his/her understanding and acceptance.     Dental Advisory Given  Plan Discussed with: CRNA  Anesthesia Plan Comments:         Anesthesia Quick Evaluation

## 2020-01-25 NOTE — Anesthesia Postprocedure Evaluation (Signed)
Anesthesia Post Note  Patient: Tammy Proctor  Procedure(s) Performed: COLONOSCOPY WITH PROPOFOL (N/A ) BIOPSY  Patient location during evaluation: Endoscopy Anesthesia Type: General Level of consciousness: awake and alert and patient cooperative Pain management: satisfactory to patient Vital Signs Assessment: post-procedure vital signs reviewed and stable Respiratory status: spontaneous breathing Cardiovascular status: stable Postop Assessment: no apparent nausea or vomiting Anesthetic complications: no   No complications documented.   Last Vitals:  Vitals:   01/25/20 0848  BP: 130/70  Resp: (!) 23  Temp: 36.5 C  SpO2: 97%    Last Pain:  Vitals:   01/25/20 1045  TempSrc:   PainSc: 0-No pain                 Shone Leventhal

## 2020-01-25 NOTE — Anesthesia Procedure Notes (Addendum)
Date/Time: 01/25/2020 10:44 AM Performed by: Vista Deck, CRNA Pre-anesthesia Checklist: Patient identified, Emergency Drugs available, Suction available, Timeout performed and Patient being monitored Patient Re-evaluated:Patient Re-evaluated prior to induction Oxygen Delivery Method: Nasal Cannula

## 2020-01-26 LAB — SURGICAL PATHOLOGY

## 2020-01-27 ENCOUNTER — Telehealth: Payer: Self-pay | Admitting: Internal Medicine

## 2020-01-27 ENCOUNTER — Other Ambulatory Visit: Payer: Self-pay | Admitting: Physician Assistant

## 2020-01-27 ENCOUNTER — Encounter (HOSPITAL_COMMUNITY): Payer: Self-pay | Admitting: Internal Medicine

## 2020-01-27 ENCOUNTER — Telehealth: Payer: Self-pay | Admitting: Student

## 2020-01-27 MED ORDER — AMLODIPINE BESYLATE 10 MG PO TABS
10.0000 mg | ORAL_TABLET | Freq: Every day | ORAL | 1 refills | Status: DC
Start: 2020-01-27 — End: 2022-06-16

## 2020-01-27 MED ORDER — ATORVASTATIN CALCIUM 20 MG PO TABS
20.0000 mg | ORAL_TABLET | Freq: Every day | ORAL | 1 refills | Status: DC
Start: 2020-01-27 — End: 2022-06-16

## 2020-01-27 NOTE — Telephone Encounter (Signed)
See 1:21pm patient call.

## 2020-01-27 NOTE — Telephone Encounter (Signed)
Patient called again screaming into the phone that she wanted her results from [facility] procedure.  Yelled at me "NO NO NO, THEY SAID I WOULD HAVE RESULTS IN 2 DAYS"

## 2020-01-27 NOTE — Telephone Encounter (Signed)
Pt called to schedule an appt for sore throat and laryngitis. [facility] is going COVID testing today pt was urged to call to schedule an appt to get tested there as opposed to waiting to get testing in [place] (pt lives in [place]). Pt verbalized understanding and states she will call.  Pt is scheduled an EV with PA for tomorrow, 01-28-20

## 2020-01-27 NOTE — Telephone Encounter (Signed)
Please call patient at [phone]. Regarding her results

## 2020-01-27 NOTE — Telephone Encounter (Signed)
Noted, routing to Dr. Abbey Chatters for results

## 2020-01-27 NOTE — Telephone Encounter (Signed)
Phoned the pt and advised her results are not in yet and as soon as we get it I would call her. She stated ok she was just bored earlier and her anxiety got the best of her.

## 2020-01-28 ENCOUNTER — Other Ambulatory Visit: Payer: Self-pay | Admitting: Physician Assistant

## 2020-01-28 ENCOUNTER — Ambulatory Visit: Payer: Self-pay | Admitting: Physician Assistant

## 2020-01-28 ENCOUNTER — Encounter: Payer: Self-pay | Admitting: Physician Assistant

## 2020-01-28 DIAGNOSIS — F1090 Alcohol use, unspecified, uncomplicated: Secondary | ICD-10-CM

## 2020-01-28 DIAGNOSIS — J029 Acute pharyngitis, unspecified: Secondary | ICD-10-CM

## 2020-01-28 DIAGNOSIS — F17200 Nicotine dependence, unspecified, uncomplicated: Secondary | ICD-10-CM

## 2020-01-28 DIAGNOSIS — F419 Anxiety disorder, unspecified: Secondary | ICD-10-CM

## 2020-01-28 DIAGNOSIS — Z789 Other specified health status: Secondary | ICD-10-CM

## 2020-01-28 NOTE — Telephone Encounter (Signed)
I called and spoke with patient and gave her the results. I also discussed case with surgery. She needs a repeat colonoscopy in the next 4-6 weeks due to poor colon prep. Can we try a different prep this time such as Suprep? Also extend her clear liquids to a day and a half.   On a side note, she did apologize for yelling at [facility]. Patient has underlying psych issues and has been worried about her results.

## 2020-01-28 NOTE — Telephone Encounter (Signed)
Noted. Routed to [place]

## 2020-01-28 NOTE — Progress Notes (Signed)
There were no vitals taken for this visit.   Subjective:    Patient ID: Tammy Proctor, female    DOB: 1955-03-31, 64 y.o.   MRN: [id number]  HPI: Tammy Proctor is a 64 y.o. female presenting on 01/28/2020 for No chief complaint on file.   HPI   This is a telemedicine appointment due to covornavirus pandemic.  It is via telephone as pt does not have a video enabled device  I connected with  Tammy Proctor on 01/28/20 by a video enabled telemedicine application and verified that I am speaking with the correct person using two identifiers.   I discussed the limitations of evaluation and management by telemedicine. The patient expressed understanding and agreed to proceed.  Pt is at home.  Provider is at office.    Pt states ST and laryngitis.  She says it Started 2 days before colonoscopy on Monday.  Laryngitis has improved.  She has coughed up some stuff.  She had Some right ear pain.  Her Ear is better now after she put sweet oil in it.    Everything is fine now except throat still hurting a bit.  She is Drinking hot teas.     Pt has received 2 doses covid vaccination and she had negative covid test 01/20/20.   She is still drinking; she says 3 days/week.     She hasn't been to counseling lately.    She says she spoke with someone about her colonoscopy results and she has to have surgery.  review of phone note shows that a repeat colonoscopy is planned.       Relevant past medical, surgical, family and social history reviewed and updated as indicated. Interim medical history since our last visit reviewed. Allergies and medications reviewed and updated.   Current Outpatient Medications:  .  amLODipine (NORVASC) 10 MG tablet, Take 1 tablet (10 mg total) by mouth daily., Disp: 30 tablet, Rfl: 1 .  atorvastatin (LIPITOR) 20 MG tablet, Take 1 tablet (20 mg total) by mouth daily., Disp: 30 tablet, Rfl: 1 .  ketotifen (EYE ITCH RELIEF) 0.025 % ophthalmic  solution, Place 1 drop into both eyes daily as needed (itching and Watery eye)., Disp: , Rfl:  .  naproxen sodium (ALEVE) 220 MG tablet, Take 220 mg by mouth daily as needed (pain)., Disp: , Rfl:  .  promethazine (PHENERGAN) 25 MG tablet, 1/2 - 1 po q 8 hr prn n/v (Patient taking differently: Take 12.5 mg by mouth every 8 (eight) hours as needed for nausea or vomiting. ), Disp: 20 tablet, Rfl: 0 .  sertraline (ZOLOFT) 25 MG tablet, Take 1 tablet (25 mg total) by mouth daily. (Patient taking differently: Take 50 mg by mouth daily. ), Disp: 90 tablet, Rfl: 1      Review of Systems  Per HPI unless specifically indicated above     Objective:    There were no vitals taken for this visit.  Wt Readings from Last 3 Encounters:  01/25/20 128 lb (58.1 kg)  01/18/20 125 lb (56.7 kg)  12/10/19 126 lb (57.2 kg)    Physical Exam Constitutional:      General: She is not in acute distress. Pulmonary:     Effort: Pulmonary effort is normal. No respiratory distress.     Comments: Pt is talking in complete sentences without having dyspnea. Neurological:     Mental Status: She is alert and oriented to person, place, and time.  Psychiatric:  Attention and Perception: Attention normal.        Speech: Speech normal.        Behavior: Behavior is cooperative.            Assessment & Plan:   Encounter Diagnoses  Name Primary?  . Sore throat Yes  . Heavy alcohol use   . Tobacco use disorder   . Anxiety        -encouraged pt to rest her voice, avoid smoking and alcohol, warm salt water gargles -she is to continue with GI per their recommendations -encouraged pt to get back to [facility] -pt to follow up February as scheduled.  She is to contact office sooner prn

## 2020-01-29 NOTE — Telephone Encounter (Signed)
LMVOM 

## 2020-02-01 NOTE — Telephone Encounter (Signed)
Letter mailed

## 2020-02-01 NOTE — Telephone Encounter (Signed)
Called pt, LMOVM 

## 2020-02-15 ENCOUNTER — Telehealth: Payer: Self-pay | Admitting: Internal Medicine

## 2020-02-15 MED ORDER — SUPREP BOWEL PREP KIT 17.5-3.13-1.6 GM/177ML PO SOLN: 1.0000 | ORAL | 0 refills | Status: DC

## 2020-02-15 NOTE — Addendum Note (Signed)
Addended by: Zara Council C on: 02/15/2020 11:21 AM   Modules accepted: Orders

## 2020-02-15 NOTE — Telephone Encounter (Signed)
Patient received letter that we were trying to reach her.  Has minutes on her phone now.  Please call back

## 2020-02-15 NOTE — Telephone Encounter (Signed)
Called pt, TCS w/Propofol ASA 2 w/Dr. Abbey Chatters scheduled for 03/03/20 at 10:15am. COVID test 03/01/20 at 10:15am. Pt is aware Dr. Abbey Chatters recommends Suprep d/t Miralax prep wasn't effective. Informed she would have to pay for prep and we don't have coupons. Rx sent to pharmacy. Orders entered. Appt letter mailed with procedure instructions.

## 2020-02-22 ENCOUNTER — Telehealth: Payer: Self-pay | Admitting: Internal Medicine

## 2020-02-22 NOTE — Telephone Encounter (Signed)
PATIENT CALLED AND SAID THAT HER PREP WAS TOO EXPENSIVE. SHE NEEDS SOMETHING ELSE

## 2020-02-22 NOTE — Telephone Encounter (Signed)
I guess that is our only option at this point.  Please extend her clear liquids to 1.5 days as her prep was not adequate previous colonoscopy.  Thank you

## 2020-02-22 NOTE — Telephone Encounter (Signed)
Called patient. She states the preps cost out of pocket are very expensive and can't afford it. She wants to try the miralax prep again. Dr. Abbey Chatters, please advise if okay to do so? Thanks

## 2020-02-23 NOTE — Telephone Encounter (Signed)
Called pt  She is aware

## 2020-02-23 NOTE — Telephone Encounter (Signed)
LMOVM for pt. Instructions printed out and placed at front desk for pick up

## 2020-03-01 ENCOUNTER — Other Ambulatory Visit: Payer: Self-pay

## 2020-03-01 ENCOUNTER — Other Ambulatory Visit (HOSPITAL_COMMUNITY)
Admission: RE | Admit: 2020-03-01 | Discharge: 2020-03-01 | Disposition: A | Discharge status: Home / Self Care | Payer: Self-pay | Source: Ambulatory Visit | Attending: Internal Medicine | Admitting: Internal Medicine

## 2020-03-01 DIAGNOSIS — Z01812 Encounter for preprocedural laboratory examination: Secondary | ICD-10-CM | POA: Insufficient documentation

## 2020-03-01 DIAGNOSIS — Z20822 Contact with and (suspected) exposure to covid-19: Secondary | ICD-10-CM | POA: Insufficient documentation

## 2020-03-01 LAB — SARS CORONAVIRUS 2 (TAT 6-24 HRS): SARS Coronavirus 2: NEGATIVE

## 2020-03-03 ENCOUNTER — Other Ambulatory Visit: Payer: Self-pay

## 2020-03-03 ENCOUNTER — Ambulatory Visit (HOSPITAL_COMMUNITY)
Admission: RE | Admit: 2020-03-03 | Discharge: 2020-03-03 | Disposition: A | Discharge status: Home / Self Care | Payer: Self-pay | Attending: Internal Medicine | Admitting: Internal Medicine

## 2020-03-03 ENCOUNTER — Telehealth: Payer: Self-pay

## 2020-03-03 ENCOUNTER — Encounter (HOSPITAL_COMMUNITY): Payer: Self-pay

## 2020-03-03 ENCOUNTER — Ambulatory Visit (HOSPITAL_COMMUNITY): Payer: Self-pay | Admitting: Anesthesiology

## 2020-03-03 ENCOUNTER — Telehealth: Payer: Self-pay | Admitting: *Deleted

## 2020-03-03 ENCOUNTER — Encounter (HOSPITAL_COMMUNITY)
Admission: RE | Disposition: A | Discharge status: Home / Self Care | Payer: Self-pay | Source: Home / Self Care | Attending: Internal Medicine

## 2020-03-03 DIAGNOSIS — K648 Other hemorrhoids: Secondary | ICD-10-CM | POA: Insufficient documentation

## 2020-03-03 DIAGNOSIS — R634 Abnormal weight loss: Secondary | ICD-10-CM | POA: Insufficient documentation

## 2020-03-03 DIAGNOSIS — K5730 Diverticulosis of large intestine without perforation or abscess without bleeding: Secondary | ICD-10-CM | POA: Insufficient documentation

## 2020-03-03 DIAGNOSIS — D125 Benign neoplasm of sigmoid colon: Secondary | ICD-10-CM | POA: Insufficient documentation

## 2020-03-03 DIAGNOSIS — R933 Abnormal findings on diagnostic imaging of other parts of digestive tract: Secondary | ICD-10-CM | POA: Insufficient documentation

## 2020-03-03 DIAGNOSIS — K5669 Other partial intestinal obstruction: Secondary | ICD-10-CM | POA: Insufficient documentation

## 2020-03-03 DIAGNOSIS — K635 Polyp of colon: Secondary | ICD-10-CM

## 2020-03-03 DIAGNOSIS — R1032 Left lower quadrant pain: Secondary | ICD-10-CM | POA: Insufficient documentation

## 2020-03-03 DIAGNOSIS — D490 Neoplasm of unspecified behavior of digestive system: Secondary | ICD-10-CM

## 2020-03-03 DIAGNOSIS — Z79899 Other long term (current) drug therapy: Secondary | ICD-10-CM | POA: Insufficient documentation

## 2020-03-03 HISTORY — PX: COLONOSCOPY WITH PROPOFOL: SHX5780

## 2020-03-03 HISTORY — PX: POLYPECTOMY: SHX5525

## 2020-03-03 SURGERY — COLONOSCOPY WITH PROPOFOL
Anesthesia: General

## 2020-03-03 MED ORDER — PROPOFOL 10 MG/ML IV BOLUS
INTRAVENOUS | Status: DC | PRN
  Administered 2020-03-03 (×5): 50 mg via INTRAVENOUS
  Administered 2020-03-03: 160 mg via INTRAVENOUS
  Administered 2020-03-03: 40 mg via INTRAVENOUS

## 2020-03-03 MED ORDER — LACTATED RINGERS IV SOLN
INTRAVENOUS | Status: DC
  Administered 2020-03-03: 1000 mL via INTRAVENOUS

## 2020-03-03 MED ORDER — LIDOCAINE HCL (CARDIAC) PF 100 MG/5ML IV SOSY
PREFILLED_SYRINGE | INTRAVENOUS | Status: DC | PRN
  Administered 2020-03-03: 50 mg via INTRAVENOUS

## 2020-03-03 MED ORDER — MIDAZOLAM HCL 2 MG/2ML IJ SOLN
INTRAMUSCULAR | Status: AC
  Filled 2020-03-03: qty 2

## 2020-03-03 MED ORDER — MIDAZOLAM HCL 2 MG/2ML IJ SOLN
INTRAMUSCULAR | Status: DC | PRN
  Administered 2020-03-03: 2 mg via INTRAVENOUS

## 2020-03-03 NOTE — Anesthesia Procedure Notes (Signed)
Date/Time: 03/03/2020 10:12 AM Performed by: Julian Reil, CRNA Pre-anesthesia Checklist: Patient identified, Emergency Drugs available, Suction available and Patient being monitored Patient Re-evaluated:Patient Re-evaluated prior to induction Oxygen Delivery Method: Nasal cannula Induction Type: IV induction Placement Confirmation: positive ETCO2

## 2020-03-03 NOTE — Telephone Encounter (Signed)
Referral sent 

## 2020-03-03 NOTE — Telephone Encounter (Signed)
Duplicate message. 

## 2020-03-03 NOTE — Discharge Instructions (Signed)
  Colonoscopy Discharge Instructions  Read the instructions outlined below and refer to this sheet in the next few weeks. These discharge instructions provide you with general information on caring for yourself after you leave the hospital. Your doctor may also give you specific instructions. While your treatment has been planned according to the most current medical practices available, unavoidable complications occasionally occur.   ACTIVITY  You may resume your regular activity, but move at a slower pace for the next 24 hours.   Take frequent rest periods for the next 24 hours.   Walking will help get rid of the air and reduce the bloated feeling in your belly (abdomen).   No driving for 24 hours (because of the medicine (anesthesia) used during the test).    Do not sign any important legal documents or operate any machinery for 24 hours (because of the anesthesia used during the test).  NUTRITION  Drink plenty of fluids.   You may resume your normal diet as instructed by your doctor.   Begin with a light meal and progress to your normal diet. Heavy or fried foods are harder to digest and may make you feel sick to your stomach (nauseated).   Avoid alcoholic beverages for 24 hours or as instructed.  MEDICATIONS  You may resume your normal medications unless your doctor tells you otherwise.  WHAT YOU CAN EXPECT TODAY  Some feelings of bloating in the abdomen.   Passage of more gas than usual.   Spotting of blood in your stool or on the toilet paper.  IF YOU HAD POLYPS REMOVED DURING THE COLONOSCOPY:  No aspirin products for 7 days or as instructed.   No alcohol for 7 days or as instructed.   Eat a soft diet for the next 24 hours.  FINDING OUT THE RESULTS OF YOUR TEST Not all test results are available during your visit. If your test results are not back during the visit, make an appointment with your caregiver to find out the results. Do not assume everything is normal if  you have not heard from your caregiver or the medical facility. It is important for you to follow up on all of your test results.  SEEK IMMEDIATE MEDICAL ATTENTION IF:  You have more than a spotting of blood in your stool.   Your belly is swollen (abdominal distention).   You are nauseated or vomiting.   You have a temperature over 101.   You have abdominal pain or discomfort that is severe or gets worse throughout the day.   Your colonoscopy showed previously noted large polyp/mass.  I did find another large polyp in the left-sided colon which I removed successfully.  The colon prep was excellent today.  We will refer you to surgery to discuss potential surgical resection of the large submucosal polyp.  We will plan on repeat colonoscopy in 1 year.  I hope you have a great rest of your week!  Hennie Duos. [name], D.O. Gastroenterology and Hepatology [facility]

## 2020-03-03 NOTE — Transfer of Care (Signed)
Immediate Anesthesia Transfer of Care Note  Patient: Tammy Proctor  Procedure(s) Performed: COLONOSCOPY WITH PROPOFOL (N/A ) POLYPECTOMY  Patient Location: Endoscopy Unit  Anesthesia Type:General  Level of Consciousness: drowsy  Airway & Oxygen Therapy: Patient Spontanous Breathing  Post-op Assessment: Report given to RN and Post -op Vital signs reviewed and stable  Post vital signs: Reviewed and stable  Last Vitals:  Vitals Value Taken Time  BP    Temp    Pulse    Resp    SpO2      Last Pain:  Vitals:   03/03/20 1005  TempSrc:   PainSc: 0-No pain      Patients Stated Pain Goal: 6 (03/03/20 0939)  Complications: No complications documented.

## 2020-03-03 NOTE — Op Note (Signed)
[facility] Patient Name: Tammy Proctor Procedure Date: 03/03/2020 9:56 AM MRN: [id number] Date of Birth: 1955/06/01 Attending MD: Elon Alas. Abbey Chatters DO CSN: [id number] Age: 65 Admit Type: Outpatient Procedure:                Colonoscopy Indications:              Abnormal CT of the GI tract Providers:                Elon Alas. Leo Weyandt, DO, Otis Peak B. Sharon Seller, RN,                            Caprice Kluver, Nelma Rothman, Technician Referring MD:              Medicines:                See the Anesthesia note for documentation of the                            administered medications Complications:            No immediate complications. Estimated Blood Loss:     Estimated blood loss was minimal. Procedure:                Pre-Anesthesia Assessment:                           - The anesthesia plan was to use monitored                            anesthesia care (MAC).                           After obtaining informed consent, the colonoscope                            was passed under direct vision. Throughout the                            procedure, the patient's blood pressure, pulse, and                            oxygen saturations were monitored continuously. The                            [id number]-HQ190L ([id number]) scope was introduced through                            the anus and advanced to the the cecum, identified                            by appendiceal orifice and ileocecal valve. The                            colonoscopy was performed without difficulty. The                            patient  tolerated the procedure well. The quality                            of the bowel preparation was evaluated using the                            BBPS [facility] Bowel Preparation Scale) with scores                            of: Right Colon = 3, Transverse Colon = 3 and Left                            Colon = 3 (entire mucosa seen well with no residual                            staining,  small fragments of stool or opaque                            liquid). The total BBPS score equals 9. Scope In: 10:14:13 AM Scope Out: 10:37:34 AM Scope Withdrawal Time: 0 hours 17 minutes 37 seconds  Total Procedure Duration: 0 hours 23 minutes 21 seconds  Findings:      The perianal and digital rectal examinations were normal.      Non-bleeding internal hemorrhoids were found during endoscopy.      Multiple small and large-mouthed diverticula were found in the sigmoid       colon and descending colon.      A 13 mm polyp was found in the sigmoid colon. The polyp was       pedunculated. The polyp was removed with a hot snare. Resection and       retrieval were complete.      A submucosal partially obstructing mass was found in the transverse       colon. The mass was partially circumferential (involving one-third of       the lumen circumference). No bleeding was present.      A tattoo was seen in the transverse colon just distal tot he above noted       submucosal lesion. Impression:               - Non-bleeding internal hemorrhoids.                           - Diverticulosis in the sigmoid colon and in the                            descending colon.                           - One 13 mm polyp in the sigmoid colon, removed                            with a hot snare. Resected and retrieved.                           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                           - A tattoo was seen in the transverse colon. Moderate Sedation:      Per Anesthesia Care Recommendation:           - Patient has a contact number available for                            emergencies. The signs and symptoms of potential                            delayed complications were discussed with the                            patient. Return to normal activities tomorrow.                            Written discharge instructions were provided to the                             patient.                           - Resume previous diet.                           - Continue present medications.                           - Await pathology results.                           - Repeat colonoscopy in 1 year for surveillance.                           - WIll refer to surgery to discuss colon resection.                            Case was discussed with Dr. Arnoldo Morale previously. Procedure Code(s):        --- Professional ---                           [id number], Colonoscopy, flexible; with removal of                            tumor(s), polyp(s), or other lesion(s) by snare                            technique Diagnosis Code(s):        --- Professional ---                           K64.8, Other hemorrhoids                           K63.5, Polyp of colon  D49.0, Neoplasm of unspecified behavior of                            digestive system                           K56.690, Other partial intestinal obstruction                           K57.30, Diverticulosis of large intestine without                            perforation or abscess without bleeding                           R93.3, Abnormal findings on diagnostic imaging of                            other parts of digestive tract CPT copyright 2019 American Medical Association. All rights reserved. The codes documented in this report are preliminary and upon coder review may  be revised to meet current compliance requirements. Elon Alas. Abbey Chatters, DO [place] Abbey Chatters, DO 03/03/2020 10:45:53 AM This report has been signed electronically. Number of Addenda: 0

## 2020-03-03 NOTE — Telephone Encounter (Signed)
Tammy Bal, DO  [place], [place] I, CMA Can we refer this patient to general surgery for transverse colon polyp to discuss resection. Dr. Lovell Sheehan or Dr. Henreitta Leber would be great. Thank you

## 2020-03-03 NOTE — H&P (Signed)
Primary Care Physician:  Soyla Dryer, PA-C Primary Gastroenterologist:  Dr. Abbey Chatters  Pre-Procedure History & Physical: HPI:  Tammy Proctor is a 65 y.o. female is here  for a colonoscopy to be performed due to poor colon prep on previous colonoscopy. She has a history of abnormal CT imaging.    Patient denies any family history of colorectal cancer.  No melena or hematochezia.  Occasional LLQ pain, endorses unintentional weight loss of 13 lbs..  No change in bowel habits.    Past Medical History:  Diagnosis Date  . Anxiety   . HLD (hyperlipidemia)   . Hypertension   . Migraine    resolved    Past Surgical History:  Procedure Laterality Date  . BIOPSY  01/25/2020   Procedure: BIOPSY;  Surgeon: Eloise Harman, DO;  Location: [facility]O [facility];  Service: Endoscopy;;  . BREAST EXCISIONAL BIOPSY Right    benign  . CATARACT EXTRACTION Right   . COLONOSCOPY WITH PROPOFOL N/A 01/25/2020   Procedure: COLONOSCOPY WITH PROPOFOL;  Surgeon: Eloise Harman, DO;  Location: [facility] ENDO SUITE;  Service: Endoscopy;  Laterality: N/A;  9:00am  . NASAL SINUS SURGERY    . TUBAL LIGATION      Prior to Admission medications   Medication Sig Start Date End Date Taking? Authorizing Provider  amLODipine (NORVASC) 10 MG tablet Take 1 tablet (10 mg total) by mouth daily. 01/27/20  Yes Soyla Dryer, PA-C  atorvastatin (LIPITOR) 20 MG tablet Take 1 tablet (20 mg total) by mouth daily. 01/27/20  Yes Soyla Dryer, PA-C  ketotifen (ZADITOR) 0.025 % ophthalmic solution Place 1 drop into both eyes daily as needed (itching and Watery eye).   Yes [provider]  Multiple Vitamins-Minerals (MULTIVITAMIN WITH MINERALS) tablet Take 1 tablet by mouth daily.   Yes [provider]  naproxen sodium (ALEVE) 220 MG tablet Take 220 mg by mouth daily as needed (pain).   Yes [provider]  sertraline (ZOLOFT) 25 MG tablet Take 1 tablet (25 mg total) by mouth daily. 12/02/19  Yes Soyla Dryer, PA-C  sodium chloride (OCEAN) 0.65 % SOLN nasal spray Place 1 spray into both nostrils as needed for congestion.   Yes [provider]  Na Sulfate-K Sulfate-Mg Sulf (SUPREP BOWEL PREP KIT) 17.5-3.13-1.6 GM/177ML SOLN Take 1 kit by mouth as directed. 02/15/20   Eloise Harman, DO  promethazine (PHENERGAN) 25 MG tablet TAKE 1/2 TO 1 (ONE-HALF TO ONE) TABLET BY MOUTH EVERY 8 HOURS AS NEEDED FOR NAUSEA AND VOMITING Patient not taking: Reported on 02/24/2020 01/28/20   Soyla Dryer, PA-C    Allergies as of 02/15/2020 - Review Complete 01/28/2020  Allergen Reaction Noted  . Ceclor [cefaclor] Hives 07/06/2013    Family History  Problem Relation Age of Onset  . Cancer Mother        breast  . Diabetes Mother   . Heart disease Father   . Mental illness Father   . Heart disease Sister   . Diabetes Brother   . Diabetes Sister   . Colon cancer Neg Hx     Social History   Socioeconomic History  . Marital status: Divorced    Spouse name: Not on file  . Number of children: 3  . Years of education: Not on file  . Highest education level: Associate degree: academic program  Occupational History  . Not on file  Tobacco Use  . Smoking status: Current Every Day Smoker    Packs/day: 0.25  Years: 52.00    Pack years: 13.00    Types: Cigarettes    Start date: 08/12/1974  . Smokeless tobacco: Never Used  Vaping Use  . Vaping Use: Never used  Substance and Sexual Activity  . Alcohol use: Yes    Comment: occasional  . Drug use: Yes    Types: Marijuana    Comment: once per month  . Sexual activity: Not Currently  Other Topics Concern  . Not on file  Social History Narrative  . Not on file   Social Determinants of Health   Financial Resource Strain: Not on file  Food Insecurity: Not on file  Transportation Needs: Not on file  Physical Activity: Not on file  Stress: Not on file  Social Connections: Not on file  Intimate Partner Violence: Not on file     Review of Systems: See HPI, otherwise negative ROS  Impression/Plan: Tammy Proctor is here for a colonoscopy to be performed due to poor colon prep on previous colonoscopy. She has a history of abnormal CT imaging.   The risks of the procedure including infection, bleed, or perforation as well as benefits, limitations, alternatives and imponderables have been reviewed with the patient. Questions have been answered. All parties agreeable.

## 2020-03-03 NOTE — Telephone Encounter (Signed)
-----   Message from Heloise Beecham, [place] sent at 03/03/2020 11:07 AM EST ----- Lanelle Bal, DO  [place] I, CMA Can we refer this patient to general surgery for transverse colon polyp to discuss resection. Dr. Lovell Sheehan or Dr. Henreitta Leber would be great. Thank you

## 2020-03-03 NOTE — Anesthesia Postprocedure Evaluation (Signed)
Anesthesia Post Note  Patient: Tammy Proctor  Procedure(s) Performed: COLONOSCOPY WITH PROPOFOL (N/A ) POLYPECTOMY  Patient location during evaluation: Endoscopy Anesthesia Type: General Level of consciousness: awake and alert and oriented Pain management: pain level controlled Vital Signs Assessment: post-procedure vital signs reviewed and stable Respiratory status: spontaneous breathing, nonlabored ventilation and respiratory function stable Cardiovascular status: blood pressure returned to baseline and stable Postop Assessment: no apparent nausea or vomiting Anesthetic complications: no   No complications documented.   Last Vitals:  Vitals:   03/03/20 0939 03/03/20 1041  BP: 133/69 123/73  Pulse: 71 86  Resp: 18 16  Temp: 36.6 C 36.5 C  SpO2: 100% 95%    Last Pain:  Vitals:   03/03/20 1041  TempSrc: Oral  PainSc: 0-No pain                 Julian Reil

## 2020-03-03 NOTE — Anesthesia Preprocedure Evaluation (Signed)
Anesthesia Evaluation  Patient identified by MRN, date of birth, ID band Patient awake    Reviewed: Allergy & Precautions, H&P , NPO status , Patient's Chart, lab work & pertinent test results, reviewed documented beta blocker date and time   Airway Mallampati: II  TM Distance: >3 FB Neck ROM: full    Dental no notable dental hx.    Pulmonary neg pulmonary ROS, Current Smoker,    Pulmonary exam normal breath sounds clear to auscultation       Cardiovascular Exercise Tolerance: Good hypertension, negative cardio ROS   Rhythm:regular Rate:Normal     Neuro/Psych  Headaches, PSYCHIATRIC DISORDERS Anxiety Depression    GI/Hepatic negative GI ROS, Neg liver ROS,   Endo/Other  negative endocrine ROS  Renal/GU negative Renal ROS  negative genitourinary   Musculoskeletal   Abdominal   Peds  Hematology negative hematology ROS (+)   Anesthesia Other Findings   Reproductive/Obstetrics negative OB ROS                             Anesthesia Physical Anesthesia Plan  ASA: III  Anesthesia Plan: General   Post-op Pain Management:    Induction:   PONV Risk Score and Plan: Propofol infusion  Airway Management Planned:   Additional Equipment:   Intra-op Plan:   Post-operative Plan:   Informed Consent: I have reviewed the patients History and Physical, chart, labs and discussed the procedure including the risks, benefits and alternatives for the proposed anesthesia with the patient or authorized representative who has indicated his/her understanding and acceptance.     Dental Advisory Given  Plan Discussed with: CRNA  Anesthesia Plan Comments:         Anesthesia Quick Evaluation  

## 2020-03-03 NOTE — Telephone Encounter (Signed)
Noted  

## 2020-03-04 LAB — SURGICAL PATHOLOGY

## 2020-03-07 ENCOUNTER — Encounter (HOSPITAL_COMMUNITY): Payer: Self-pay | Admitting: Internal Medicine

## 2020-03-22 ENCOUNTER — Other Ambulatory Visit: Payer: Self-pay | Admitting: Physician Assistant

## 2020-03-22 DIAGNOSIS — E785 Hyperlipidemia, unspecified: Secondary | ICD-10-CM

## 2020-03-22 DIAGNOSIS — I10 Essential (primary) hypertension: Secondary | ICD-10-CM

## 2020-04-04 ENCOUNTER — Ambulatory Visit: Payer: Self-pay | Admitting: Physician Assistant

## 2020-04-11 ENCOUNTER — Ambulatory Visit: Payer: Self-pay | Admitting: Physician Assistant

## 2020-04-18 ENCOUNTER — Encounter: Payer: Self-pay | Admitting: Physician Assistant

## 2020-05-05 ENCOUNTER — Ambulatory Visit: Payer: Self-pay | Admitting: Physician Assistant

## 2020-05-05 ENCOUNTER — Encounter: Payer: Self-pay | Admitting: Physician Assistant

## 2020-05-05 DIAGNOSIS — F17200 Nicotine dependence, unspecified, uncomplicated: Secondary | ICD-10-CM

## 2020-05-05 DIAGNOSIS — E785 Hyperlipidemia, unspecified: Secondary | ICD-10-CM

## 2020-05-05 DIAGNOSIS — I10 Essential (primary) hypertension: Secondary | ICD-10-CM

## 2020-05-05 NOTE — Progress Notes (Signed)
There were no vitals taken for this visit.   Subjective:    Patient ID: Tammy Proctor, female    DOB: Oct 05, 1955, 65 y.o.   MRN: [id number]  HPI: Tammy Proctor is a 65 y.o. female presenting on 05/05/2020 for No chief complaint on file.   HPI   This is a telemedicine appointment due to coronavirus pandemic.  It is viaTelephone as pt does not have a video enabled device.  I connected with  [name] on 05/05/20 by a video enabled telemedicine application and verified that I am speaking with the correct person using two identifiers.   I discussed the limitations of evaluation and management by telemedicine. The patient expressed understanding and agreed to proceed.  Pt is at home.  Provider is at office.    Pt is 15yoF with routine follow up for htn and dyslipidemia.  She Doesn't check her bp at home.  She Didn't get her labs drawn.  Pt had colonoscopy in January.  GI said refer to surgery but no appointment made.  Pt says BMs moving normally now.  She says she is not having abd pain.  She says she is Doing well except some aches from the weather changes     Relevant past medical, surgical, family and social history reviewed and updated as indicated. Interim medical history since our last visit reviewed. Allergies and medications reviewed and updated.     Current Outpatient Medications:  .  amLODipine (NORVASC) 10 MG tablet, Take 1 tablet (10 mg total) by mouth daily., Disp: 30 tablet, Rfl: 1 .  atorvastatin (LIPITOR) 20 MG tablet, Take 1 tablet (20 mg total) by mouth daily., Disp: 30 tablet, Rfl: 1 .  ketotifen (ZADITOR) 0.025 % ophthalmic solution, Place 1 drop into both eyes daily as needed (itching and Watery eye)., Disp: , Rfl:  .  Multiple Vitamins-Minerals (MULTIVITAMIN WITH MINERALS) tablet, Take 1 tablet by mouth daily., Disp: , Rfl:  .  naproxen sodium (ALEVE) 220 MG tablet, Take 220 mg by mouth daily as needed (pain)., Disp: , Rfl:  .  sertraline  (ZOLOFT) 25 MG tablet, Take 1 tablet (25 mg total) by mouth daily., Disp: 90 tablet, Rfl: 1 .  sodium chloride (OCEAN) 0.65 % SOLN nasal spray, Place 1 spray into both nostrils as needed for congestion., Disp: , Rfl:     Review of Systems  Per HPI unless specifically indicated above     Objective:    There were no vitals taken for this visit.  Wt Readings from Last 3 Encounters:  03/03/20 120 lb (54.4 kg)  01/25/20 128 lb (58.1 kg)  01/18/20 125 lb (56.7 kg)    Physical Exam Pulmonary:     Effort: No respiratory distress.     Comments: Pt is talking in complete sentences without sob Neurological:     Mental Status: She is alert and oriented to person, place, and time.  Psychiatric:        Attention and Perception: Attention normal.        Speech: Speech normal.        Behavior: Behavior is cooperative.              Assessment & Plan:    Encounter Diagnoses  Name Primary?  . Essential hypertension, benign Yes  . Hyperlipidemia, unspecified hyperlipidemia type   . Tobacco use disorder        -After appointment over, review of referrals shows that surgery was unable to contact pt.  [place], CMA, called pt and instructed her to call the surgeon to get scheduled.  Pt was given the surgeon's office phone number.    -Pt to get fasting labs drawn.  She will be called with results  -encouraged pt to get  covid booster  -pt to follow up 3 months in person in the office.  She is to contact office sooner prn

## 2020-05-24 ENCOUNTER — Telehealth: Payer: Self-pay

## 2020-05-24 NOTE — Telephone Encounter (Signed)
Called pt to inform that she had a referral by GI for surgery. Surgery could not get in touch with pt. Provide pt with contact information (ph [phone]) to [facility].

## 2020-08-08 ENCOUNTER — Ambulatory Visit: Payer: Self-pay | Admitting: Physician Assistant

## 2020-08-11 ENCOUNTER — Encounter: Payer: Self-pay | Admitting: Physician Assistant

## 2021-04-04 ENCOUNTER — Encounter: Payer: Self-pay | Admitting: *Deleted

## 2021-10-06 NOTE — Congregational Nurse Program (Signed)
F/u with patient as a result of food security and delivered  senior meals to home.  

## 2021-11-07 IMAGING — MG DIGITAL SCREENING BILAT W/ TOMO W/ CAD
8 series · 9 of 24 positions shown · non-contrast
Comparison: Previous exam(s).

CLINICAL DATA: Screening.

EXAM:
DIGITAL SCREENING BILATERAL MAMMOGRAM WITH TOMO AND CAD

[R CC synth-2D]
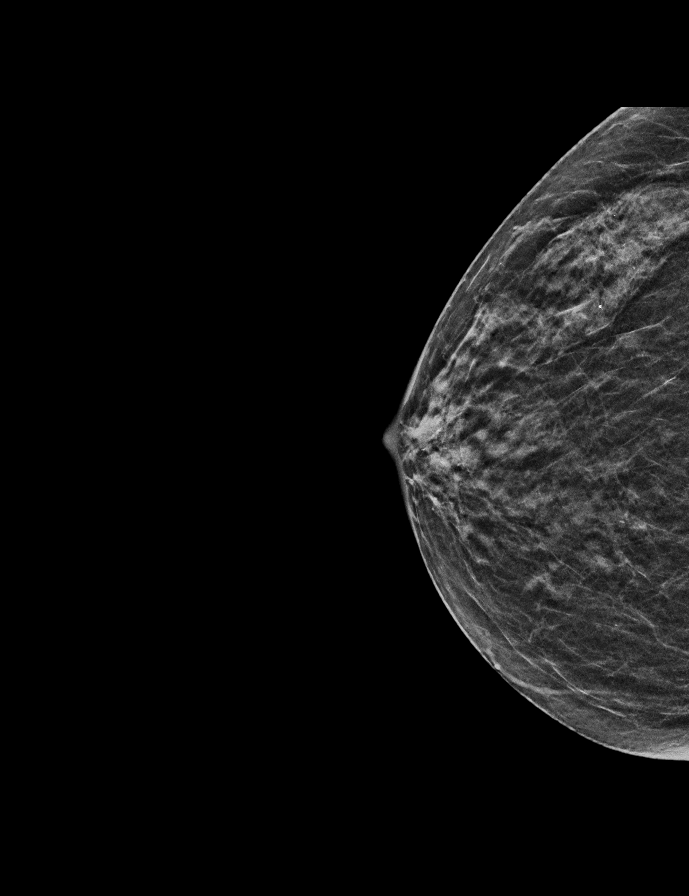

[L CC synth-2D]
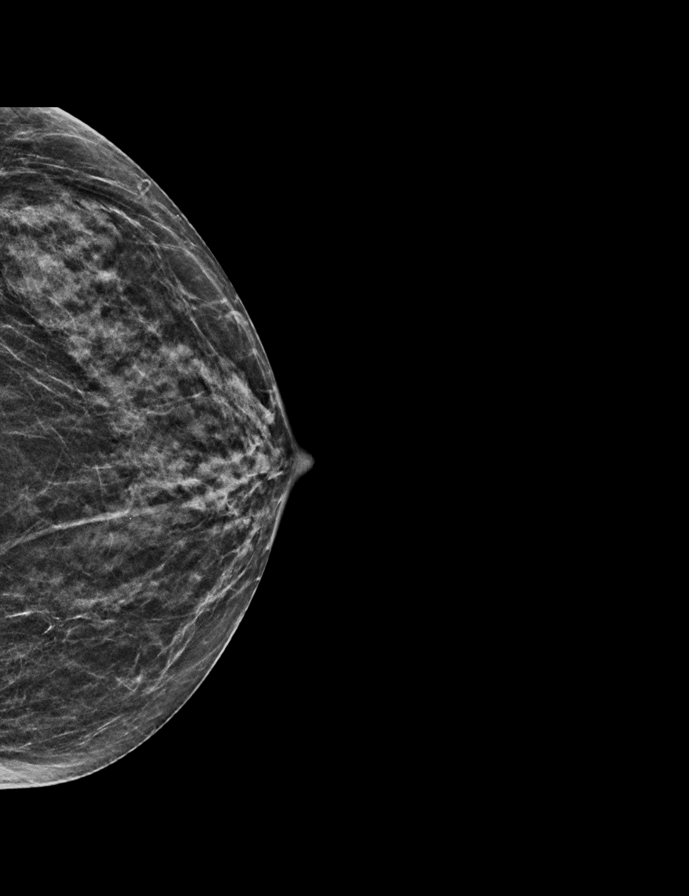

[L MLO synth-2D]
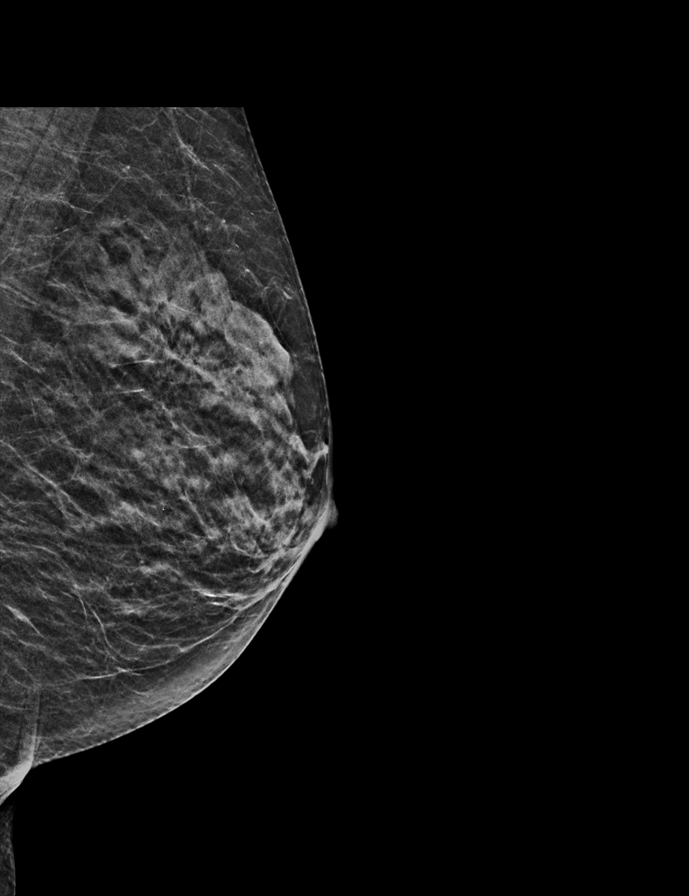

[R MLO synth-2D]
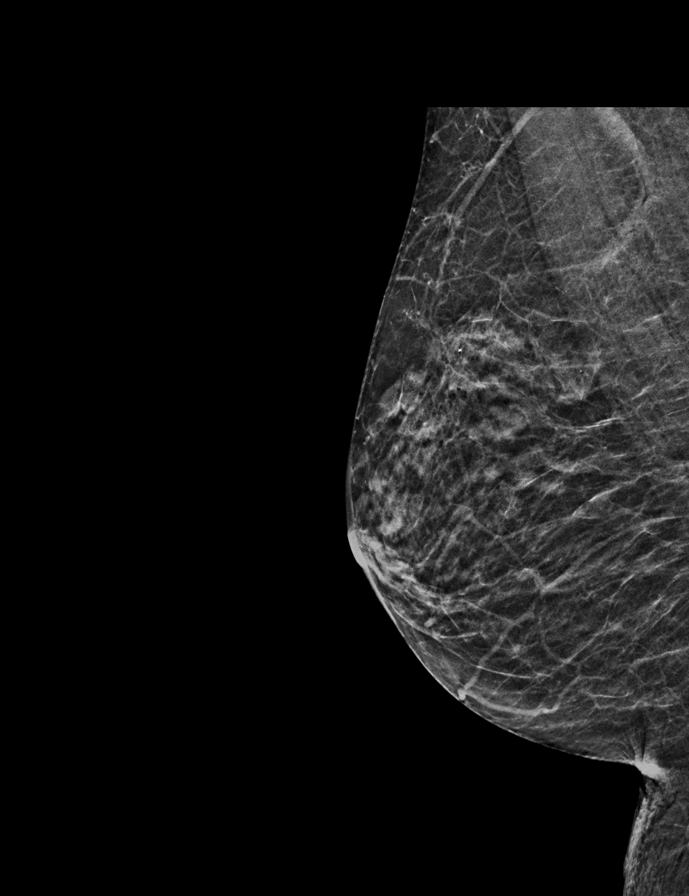

[L MLO tomo · 2 of 33 frames shown]
[frame 11/33]
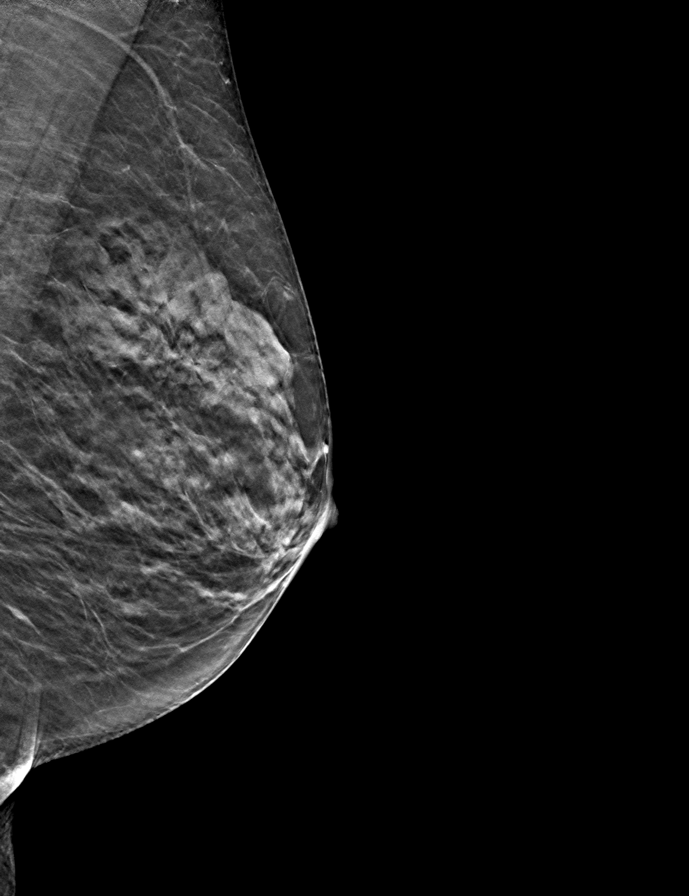
[frame 17/33]
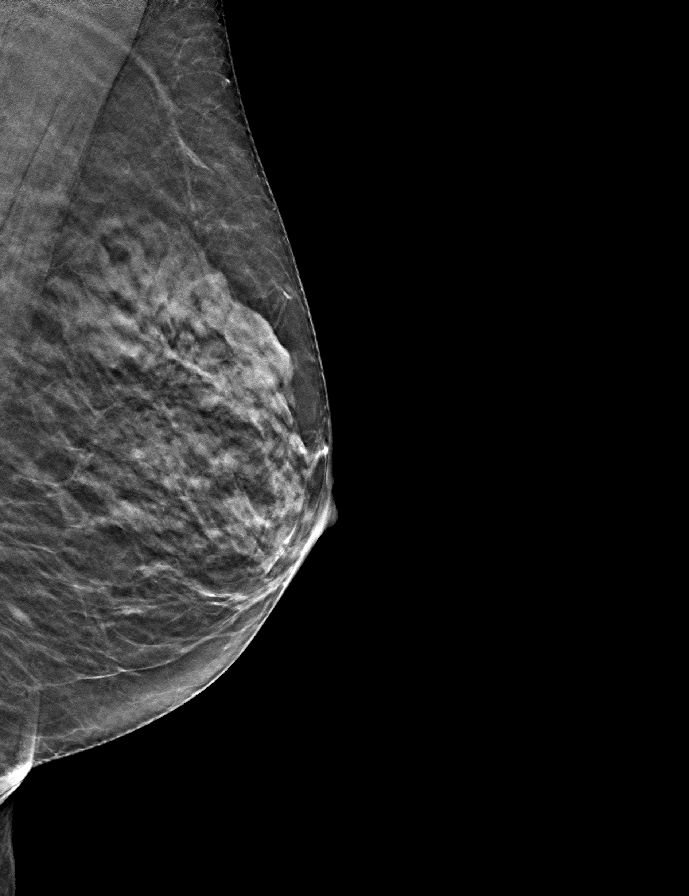

[R MLO tomo · tomo slice 17/33.0]
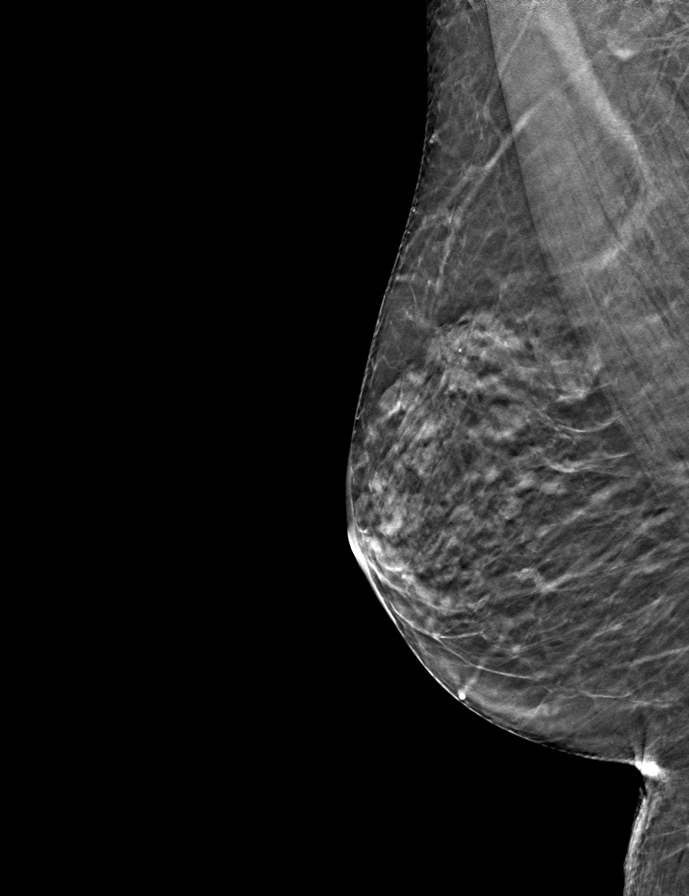

[R CC tomo · tomo slice 17/33.0]
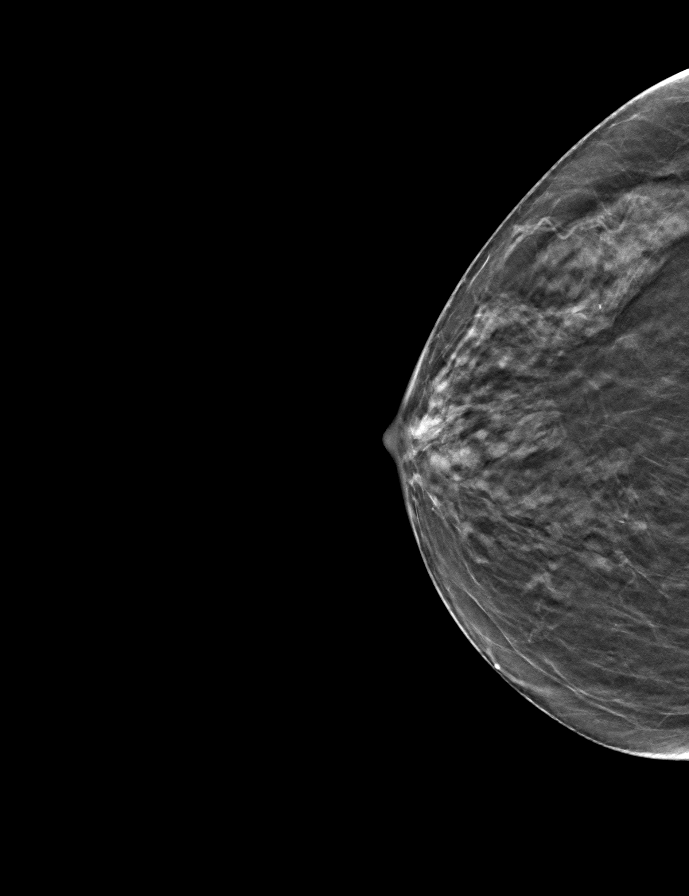

[L CC tomo · tomo slice 18/35.0]
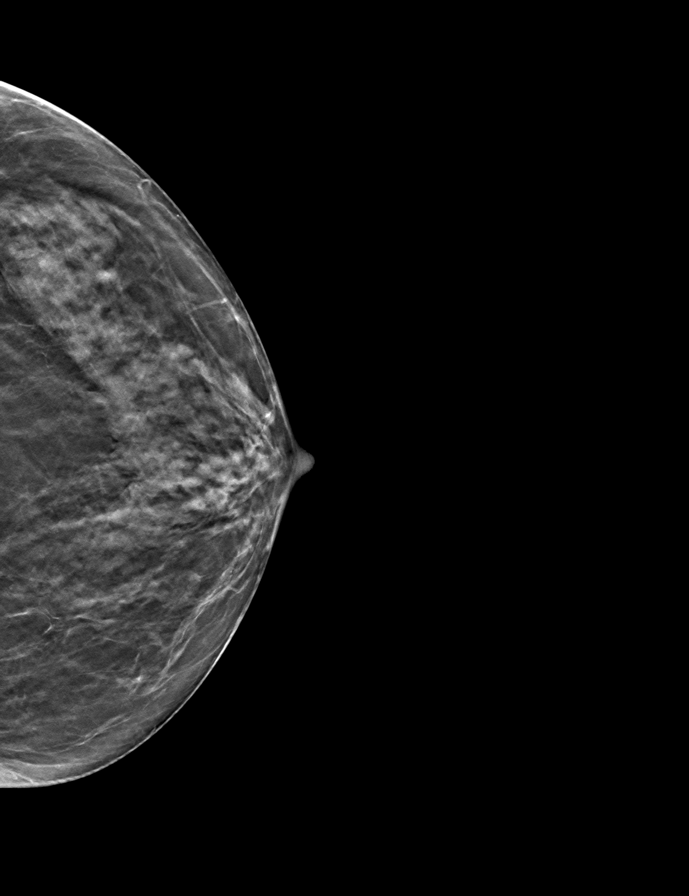

[9 of 24 positions shown; findings below may reference images not displayed]

ACR Breast Density Category c: The breast tissue is heterogeneously
dense, which may obscure small masses.
FINDINGS: There are no findings suspicious for malignancy. Images were
processed with CAD.
IMPRESSION: No mammographic evidence of malignancy. A result letter of this
screening mammogram will be mailed directly to the patient.

RECOMMENDATION:
Screening mammogram in one year. (Code:FT-U-LHB)

BI-RADS CATEGORY  1: Negative.

## 2022-02-09 IMAGING — CT CT ABD-PELV W/ CM
2 of 5 series · 15 of 46 positions shown, 17 images · IV contrast (omnipaque)
Comparison: None.

CLINICAL DATA: Left-sided abdominal pain with nausea for 4 days.
History of small-bowel obstruction.

EXAM:
CT ABDOMEN AND PELVIS WITH CONTRAST
TECHNIQUE: Multidetector CT imaging of the abdomen and pelvis was performed
using the standard protocol following bolus administration of
intravenous contrast.
CONTRAST:  80mL OMNIPAQUE IOHEXOL 300 MG/ML  SOLN

[Series 2: axial st · axial · 0.79mm/px · z∈[-339,+41]mm · 12 of 86 slices shown, 14 images]
[im 5/86  soft-tissue]
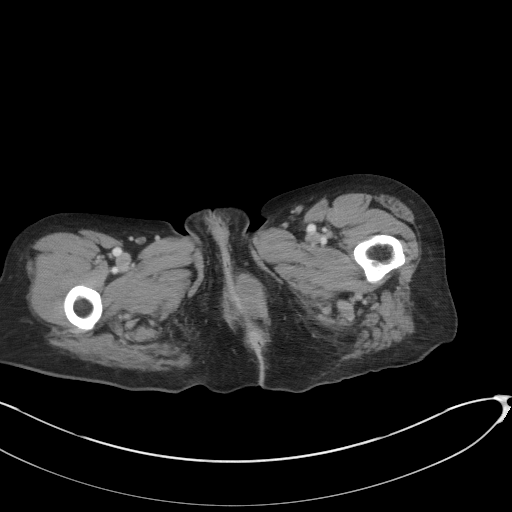
[im 5/86  bone]
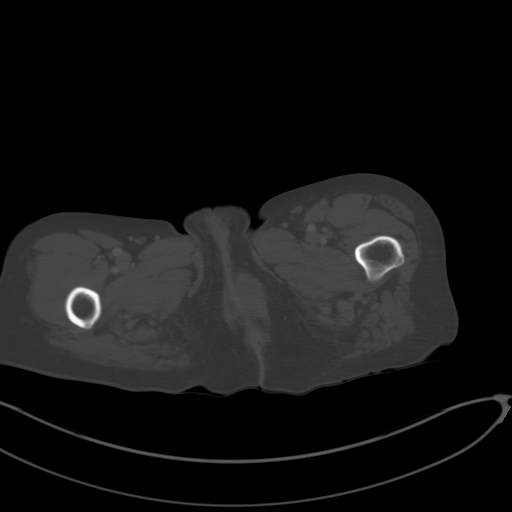
[im 15/86  soft-tissue]
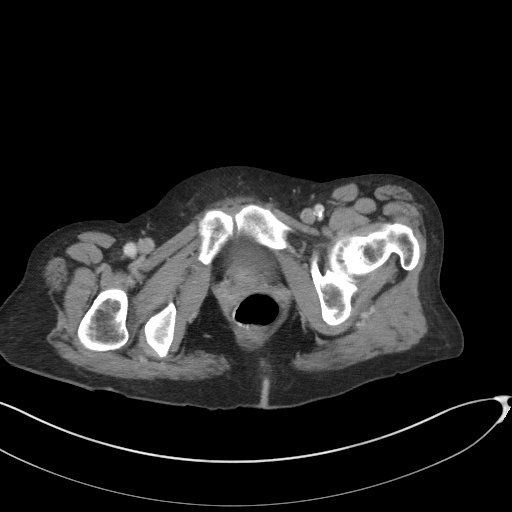
[im 19/86  soft-tissue]
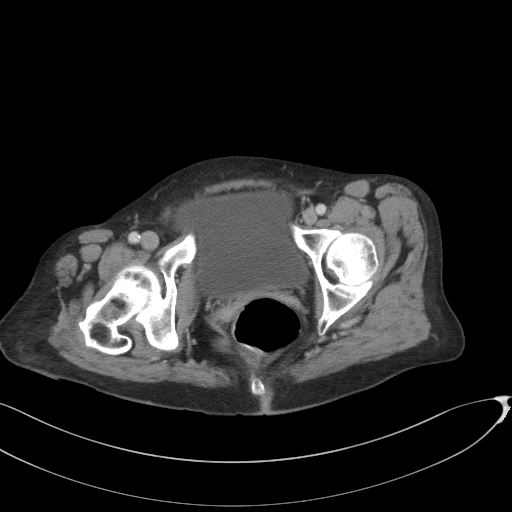
[im 24/86  soft-tissue]
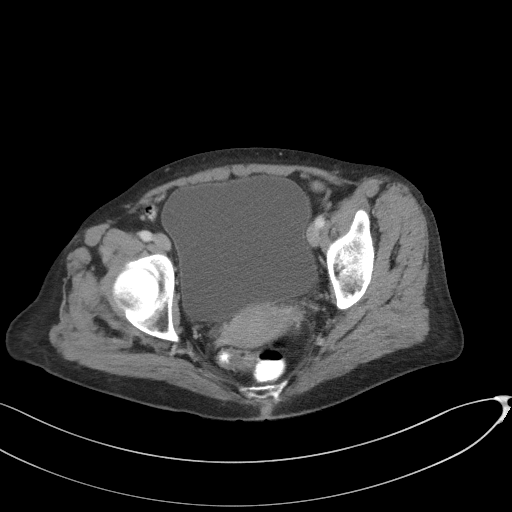
[im 34/86  soft-tissue]
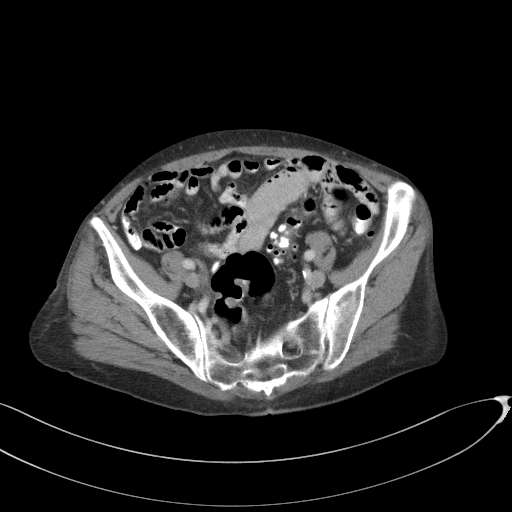
[im 38/86  soft-tissue]
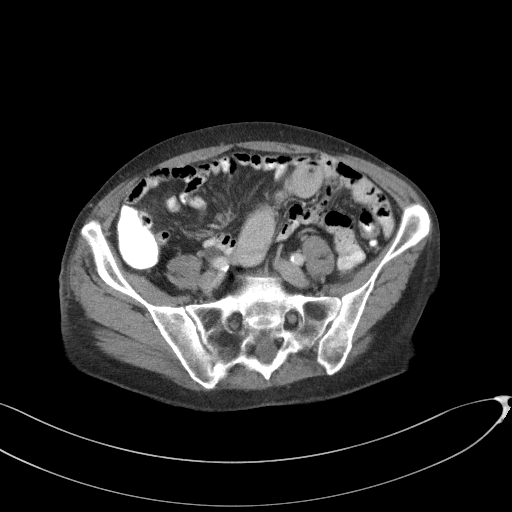
[im 48/86  soft-tissue]
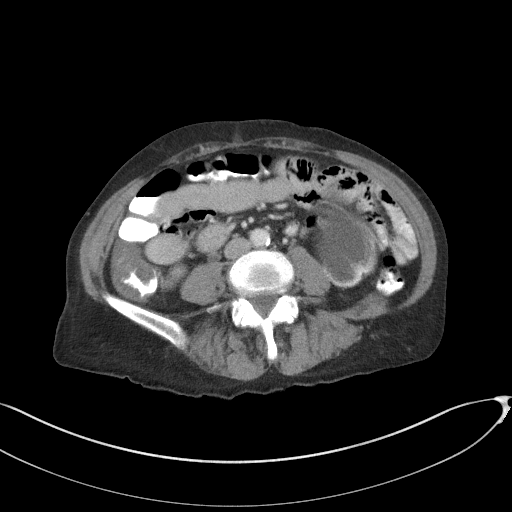
[im 52/86  soft-tissue]
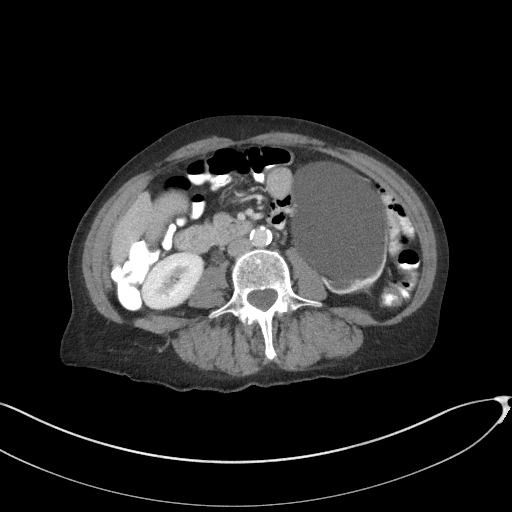
[im 62/86  soft-tissue]
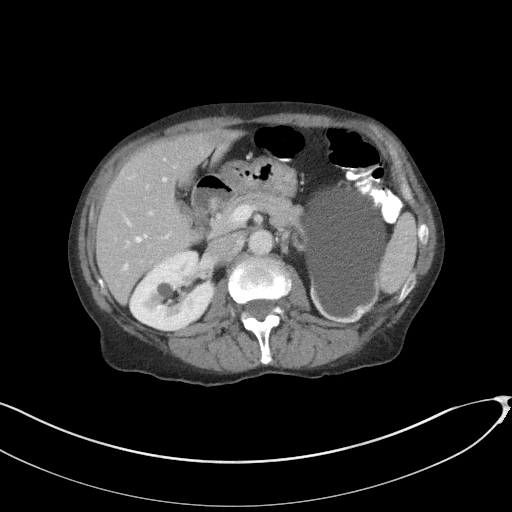
[im 62/86  bone]
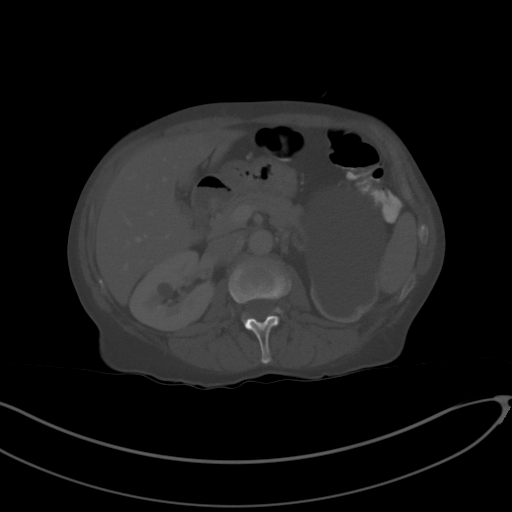
[im 67/86  soft-tissue]
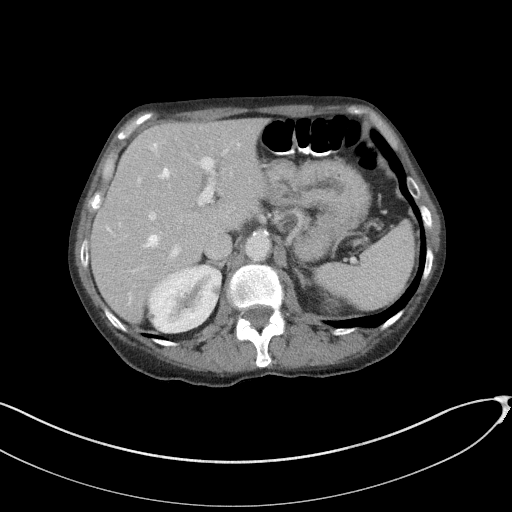
[im 71/86  soft-tissue]
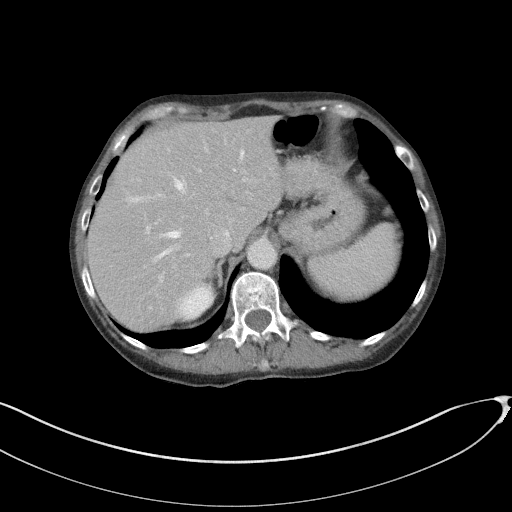
[im 81/86  soft-tissue]
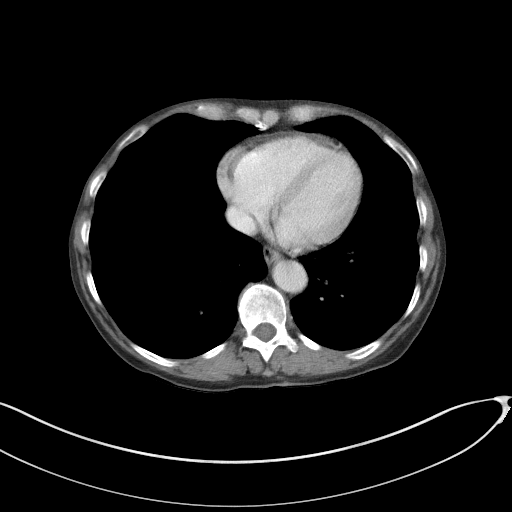

[Series 5: coronal st · coronal · 0.72mm/px · 3 of 94 slices shown]
[im 32/94  soft-tissue]
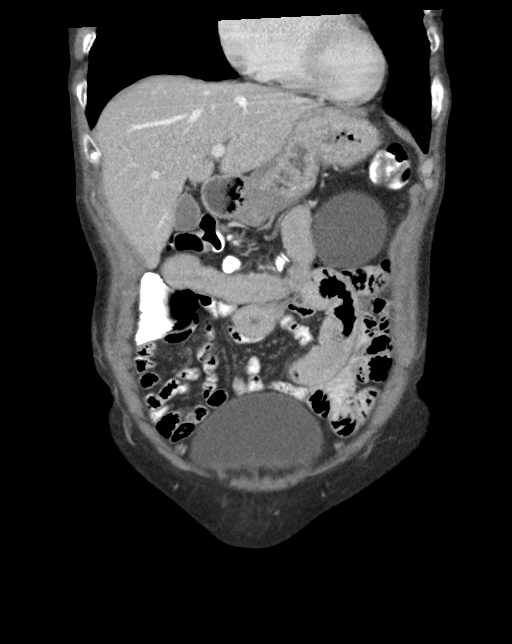
[im 42/94  soft-tissue]
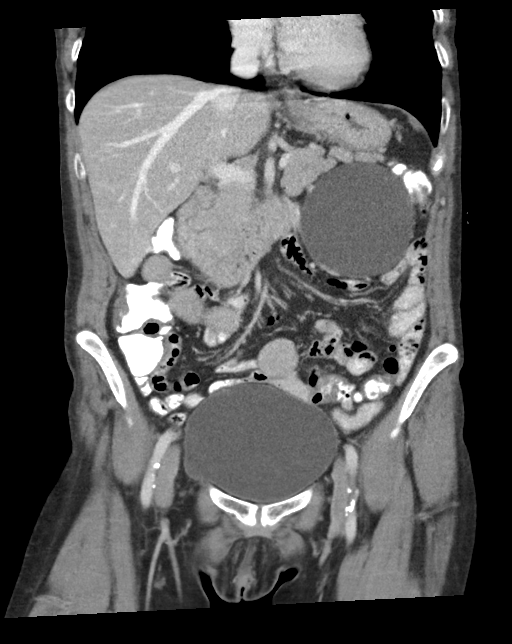
[im 52/94  soft-tissue]
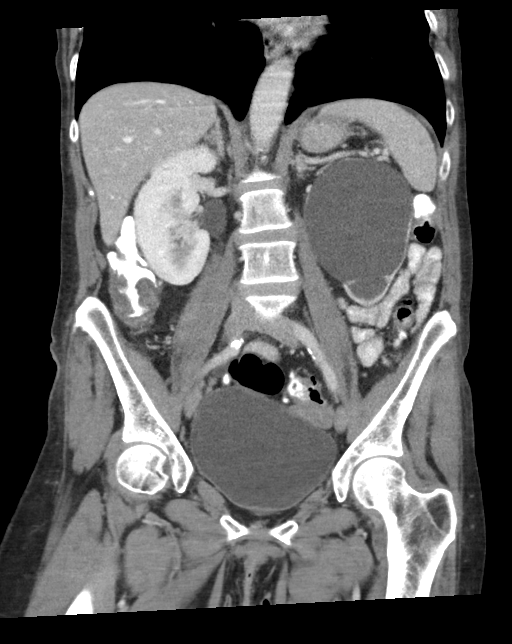

[15 of 46 positions shown; findings below may reference images not displayed]

FINDINGS: Lower chest: Mild centrilobular emphysema. Normal heart size without
pericardial or pleural effusion.

Hepatobiliary: Normal liver. Normal gallbladder, without biliary
ductal dilatation.

Pancreas: Probable pancreas divisum or variant, with a prominent
dorsal duct entering the duodenum on [DATE]. No duct dilatation or
acute inflammation.

Spleen: Normal in size, without focal abnormality.

Adrenals/Urinary Tract: Normal adrenal glands. Right renal too small
to characterize lesions. Normal right ureter and bladder.

Marked left renal cortical thinning with central renal fluid
density, favored to represent chronic severe hydronephrosis.
Decreased renal function as evidenced by lack of contrast excretion
on delayed images into the presumed dilated collecting system. No
hydroureter.

Stomach/Bowel: Decompressed proximal stomach, without gross
abnormality.

Extensive colonic diverticulosis.

Focal, masslike ascending colonic wall thickening, including on 40/2
and coronal image 49. Extends for approximately 4.8 cm length. No
obstruction. Normal terminal ileum. Appendix not visualized. Normal
small bowel.

Vascular/Lymphatic: Aortic atherosclerosis. No abdominopelvic
adenopathy.

Reproductive: Normal uterus and adnexa.

Other: No significant free fluid. Mild pelvic floor laxity. Apparent
soft tissue fullness about the left labia including on [DATE]
simply be related to pelvic floor laxity. No evidence of omental or
peritoneal disease.

Musculoskeletal: Osteopenia. Degenerate disc disease at the
lumbosacral junction.
IMPRESSION: 1. Appearance of the left kidney which is favored to represent a
chronic ureteropelvic junction obstruction. No other explanation for
left-sided pain.
2. Colonic diverticulosis without evidence of diverticulitis.
3. Findings highly suspicious for ascending colonic carcinoma. No
obstruction or evidence of abdominal metastatic disease.
4. Apparent left labial soft tissue fullness, possibly related to
pelvic floor laxity. Consider physical exam correlation.
5. Aortic atherosclerosis (TIKQE-Q24.4) and emphysema ([id number].U).
6. Pancreas divisum or variant.

These results will be called to the ordering clinician or
representative by the Radiologist Assistant, and communication
documented in the PACS or [REDACTED].

## 2022-03-12 ENCOUNTER — Ambulatory Visit
Admission: EM | Admit: 2022-03-12 | Discharge: 2022-03-12 | Disposition: A | Discharge status: Home / Self Care | Payer: Medicare HMO | Attending: Nurse Practitioner | Admitting: Nurse Practitioner

## 2022-03-12 DIAGNOSIS — B349 Viral infection, unspecified: Secondary | ICD-10-CM

## 2022-03-12 DIAGNOSIS — J069 Acute upper respiratory infection, unspecified: Secondary | ICD-10-CM | POA: Diagnosis not present

## 2022-03-12 MED ORDER — FLUTICASONE PROPIONATE 50 MCG/ACT NA SUSP: 2.0000 | Freq: Every day | NASAL | 0 refills | Status: DC

## 2022-03-12 MED ORDER — PROMETHAZINE-DM 6.25-15 MG/5ML PO SYRP
5.0000 mL | ORAL_SOLUTION | Freq: Four times a day (QID) | ORAL | 0 refills | Status: DC | PRN
Start: 2022-03-12 — End: 2022-06-16

## 2022-03-12 NOTE — ED Notes (Signed)
Pt do not want COVID test.

## 2022-03-12 NOTE — Discharge Instructions (Signed)
You have a viral illness.  Symptoms should improve over the next week to 10 days.  If you develop chest pain or shortness of breath, go to the emergency room.   We have tested you today for COVID-19.  You will see the results in Mychart and we will call you with positive results. Please stay home and isolate until you are aware of the results.  If test is positive, you are a candidate to receive molnupiravir as antiviral therapy.  Take medication as prescribed. Please be advised that the cough medication may elevate your blood pressure. If your blood pressure remains elevated while taking the medication, stop the medication and begin taking over-the-counter Coricidin HBP or Mucinex for people with high blood pressure and diabetes while your symptoms persist.   Some things that can make you feel better are: - Increased rest - Increasing fluid with water/sugar free electrolytes - Acetaminophen and ibuprofen as needed for fever/pain - Salt water gargling, chloraseptic spray and throat lozenges - Saline sinus flushes or a neti pot - Humidifying the air  As discussed, if symptoms do not improve over the next 7 to 10 days, or if they suddenly worsen, please follow-up in this clinic or with your primary care physician for further evaluation.

## 2022-03-12 NOTE — ED Provider Notes (Signed)
[facility]    CSN: [id number] Arrival date & time: 03/12/22  1640      History   Chief Complaint Chief Complaint  Patient presents with   Headache   Fatigue   Cough    HPI Tammy Proctor is a 67 y.o. female.   The history is provided by the patient.   The patient presents for complaints of fever, fatigue, headaches, body aches, cough, and dizziness that occurs when she stands.  Patient states symptoms started over the past week, and worsened over the last several days.  Patient denies sore throat, wheezing, shortness of breath, difficulty breathing, abdominal pain, nausea, vomiting, or diarrhea.  Patient states when her symptoms initially started, she did have diarrhea and nausea, which have since resolved.  Patient reports she has been taking Aleve for her symptoms.  States she has taken 2 home COVID test since her symptoms started, both were negative.  Past Medical History:  Diagnosis Date   Anxiety    HLD (hyperlipidemia)    Hypertension    Migraine    resolved    Patient Active Problem List   Diagnosis Date Noted   Abnormal CT scan, colon 01/18/2020   Hyperlipidemia 08/11/2018   Mood disorder ([place]) 08/11/2018   Anxiety 05/14/2016   Situational depression 05/16/2015   Essential hypertension, benign 01/22/2015   Tobacco use disorder 01/22/2015    Past Surgical History:  Procedure Laterality Date   BIOPSY  01/25/2020   Procedure: BIOPSY;  Surgeon: Eloise Harman, DO;  Location: [facility];  Service: Endoscopy;;   BREAST EXCISIONAL BIOPSY Right    benign   CATARACT EXTRACTION Right    COLONOSCOPY WITH PROPOFOL N/A 01/25/2020   Procedure: COLONOSCOPY WITH PROPOFOL;  Surgeon: Eloise Harman, DO;  Location: [facility];  Service: Endoscopy;  Laterality: N/A;  9:00am   COLONOSCOPY WITH PROPOFOL N/A 03/03/2020   Procedure: COLONOSCOPY WITH PROPOFOL;  Surgeon: Eloise Harman, DO;  Location: [facility];  Service: Endoscopy;  Laterality:  N/A;  10:15am   NASAL SINUS SURGERY     POLYPECTOMY  03/03/2020   Procedure: POLYPECTOMY;  Surgeon: Eloise Harman, DO;  Location: [facility];  Service: Endoscopy;;   TUBAL LIGATION      OB History   No obstetric history on file.      Home Medications    Prior to Admission medications   Medication Sig Start Date End Date Taking? Authorizing Provider  fluticasone (FLONASE) 50 MCG/ACT nasal spray Place 2 sprays into both nostrils daily. 03/12/22  Yes Carletha Dawn-Warren, Alda Lea, NP  naproxen sodium (ALEVE) 220 MG tablet Take 220 mg by mouth daily as needed (pain).   Yes [provider]  promethazine-dextromethorphan (PROMETHAZINE-DM) 6.25-15 MG/5ML syrup Take 5 mLs by mouth 4 (four) times daily as needed for cough. 03/12/22  Yes Jamon Hayhurst-Warren, Alda Lea, NP  amLODipine (NORVASC) 10 MG tablet Take 1 tablet (10 mg total) by mouth daily. 01/27/20   Soyla Dryer, PA-C  atorvastatin (LIPITOR) 20 MG tablet Take 1 tablet (20 mg total) by mouth daily. 01/27/20   Soyla Dryer, PA-C  ketotifen (ZADITOR) 0.025 % ophthalmic solution Place 1 drop into both eyes daily as needed (itching and Watery eye).    [provider]  Multiple Vitamins-Minerals (MULTIVITAMIN WITH MINERALS) tablet Take 1 tablet by mouth daily.    [provider]  sertraline (ZOLOFT) 25 MG tablet Take 1 tablet (25 mg total) by mouth daily. 12/02/19   Soyla Dryer,  PA-C  sodium chloride (OCEAN) 0.65 % SOLN nasal spray Place 1 spray into both nostrils as needed for congestion.    [provider]    Family History Family History  Problem Relation Age of Onset   Cancer Mother        breast   Diabetes Mother    Heart disease Father    Mental illness Father    Heart disease Sister    Diabetes Brother    Diabetes Sister    Colon cancer Neg Hx     Social History Social History   Tobacco Use   Smoking status: Every Day    Packs/day: 0.25    Years: 52.00    Total pack years: 13.00     Types: Cigarettes    Start date: 08/12/1974   Smokeless tobacco: Never  Vaping Use   Vaping Use: Never used  Substance Use Topics   Alcohol use: Yes    Comment: occasional   Drug use: Yes    Types: Marijuana    Comment: once per month     Allergies   Ceclor [cefaclor]   Review of Systems Review of Systems Per HPI  Physical Exam Triage Vital Signs ED Triage Vitals  Enc Vitals Group     BP 03/12/22 1700 (!) 167/88     Pulse Rate 03/12/22 1700 75     Resp 03/12/22 1700 16     Temp 03/12/22 1700 98 F (36.7 C)     Temp Source 03/12/22 1700 Oral     SpO2 03/12/22 1700 96 %     Weight --      Height --      Head Circumference --      Peak Flow --      Pain Score 03/12/22 1701 7     Pain Loc --      Pain Edu? --      Excl. in [place]? --    No data found.  Updated Vital Signs BP (!) 167/88 (BP Location: Right Arm)   Pulse 75   Temp 98 F (36.7 C) (Oral)   Resp 16   SpO2 96%   Visual Acuity Right Eye Distance:   Left Eye Distance:   Bilateral Distance:    Right Eye Near:   Left Eye Near:    Bilateral Near:     Physical Exam Vitals and nursing note reviewed.  Constitutional:      General: She is not in acute distress.    Appearance: She is well-developed.  HENT:     Head: Normocephalic.     Mouth/Throat:     Mouth: Mucous membranes are moist.  Eyes:     Extraocular Movements: Extraocular movements intact.     Right eye: Normal extraocular motion and no nystagmus.     Left eye: Normal extraocular motion and no nystagmus.     Pupils: Pupils are equal, round, and reactive to light.  Cardiovascular:     Rate and Rhythm: Regular rhythm.     Heart sounds: Normal heart sounds.  Pulmonary:     Effort: Pulmonary effort is normal. No respiratory distress.     Breath sounds: Normal breath sounds. No stridor. No wheezing, rhonchi or rales.  Abdominal:     General: Bowel sounds are normal. There is no distension.     Palpations: Abdomen is soft.   Musculoskeletal:     Cervical back: Normal range of motion.  Lymphadenopathy:     Cervical: No cervical adenopathy.  Skin:    General: Skin is warm and dry.  Neurological:     Mental Status: She is alert.     GCS: GCS eye subscore is 4. GCS verbal subscore is 5. GCS motor subscore is 6.  Psychiatric:        Mood and Affect: Mood normal.        Speech: Speech normal.        Behavior: Behavior normal.      UC Treatments / Results  Labs (all labs ordered are listed, but only abnormal results are displayed) Labs Reviewed - No data to display  EKG   Radiology No results found.  Procedures Procedures (including critical care time)  Medications Ordered in UC Medications - No data to display  Initial Impression / Assessment and Plan / UC Course  I have reviewed the triage vital signs and the nursing notes.  Pertinent labs & imaging results that were available during my care of the patient were reviewed by me and considered in my medical decision making (see chart for details).  The patient is well-appearing, she is in no acute distress, she is hypertensive, but vital signs are otherwise stable.  Suspect a viral illness, most likely influenza at this time.  Based on the patient's onset of symptoms, greater than 2 days, she is not a candidate to receive Tamiflu.  Will provide symptomatic treatment for the patient for her cough with Promethazine DM and for her nasal congestion, fluticasone 50 mcg nasal spray.  Patient was advised to monitor her blood pressure and was provided over-the-counter options if her blood pressure remains elevated.  Patient was advised to take Tylenol for her continued headache pain.  Supportive care recommendations were provided to the patient to include using a humidifier at bedtime and normal saline flushes.  Discussed viral etiology with the patient and when follow-up may be indicated.  Patient verbalizes understanding.  All questions were answered.   Patient stable for discharge.   Final Clinical Impressions(s) / UC Diagnoses   Final diagnoses:  Viral illness  Viral upper respiratory tract infection with cough     Discharge Instructions      You have a viral illness.  Symptoms should improve over the next week to 10 days.  If you develop chest pain or shortness of breath, go to the emergency room.   We have tested you today for COVID-19.  You will see the results in Mychart and we will call you with positive results. Please stay home and isolate until you are aware of the results.  If test is positive, you are a candidate to receive molnupiravir as antiviral therapy.  Take medication as prescribed. Please be advised that the cough medication may elevate your blood pressure. If your blood pressure remains elevated while taking the medication, stop the medication and begin taking over-the-counter Coricidin HBP or Mucinex for people with high blood pressure and diabetes while your symptoms persist.   Some things that can make you feel better are: - Increased rest - Increasing fluid with water/sugar free electrolytes - Acetaminophen and ibuprofen as needed for fever/pain - Salt water gargling, chloraseptic spray and throat lozenges - Saline sinus flushes or a neti pot - Humidifying the air  As discussed, if symptoms do not improve over the next 7 to 10 days, or if they suddenly worsen, please follow-up in this clinic or with your primary care physician for further evaluation.      ED Prescriptions     Medication  Sig Dispense Auth. Provider   promethazine-dextromethorphan (PROMETHAZINE-DM) 6.25-15 MG/5ML syrup Take 5 mLs by mouth 4 (four) times daily as needed for cough. 118 mL Lindzey Zent-Warren, Alda Lea, NP   fluticasone (FLONASE) 50 MCG/ACT nasal spray Place 2 sprays into both nostrils daily. 16 g Salli Bodin-Warren, Alda Lea, NP      PDMP not reviewed this encounter.   Tish Men, NP 03/12/22 1728

## 2022-03-12 NOTE — ED Triage Notes (Signed)
Cough, fever 102 Saturday, fatigue, body aches, dizzy when standing. Taking ibuprofen, tylenol, aleve for her headache. Did have diarrhea and nausea Wednesday.

## 2022-04-02 ENCOUNTER — Other Ambulatory Visit: Payer: Self-pay

## 2022-04-02 ENCOUNTER — Emergency Department (HOSPITAL_COMMUNITY): Payer: Medicare HMO

## 2022-04-02 ENCOUNTER — Emergency Department (HOSPITAL_COMMUNITY)
Admission: EM | Admit: 2022-04-02 | Discharge: 2022-04-02 | Disposition: A | Discharge status: Home / Self Care | Payer: Medicare HMO | Attending: Emergency Medicine | Admitting: Emergency Medicine

## 2022-04-02 DIAGNOSIS — S6722XA Crushing injury of left hand, initial encounter: Secondary | ICD-10-CM | POA: Insufficient documentation

## 2022-04-02 DIAGNOSIS — W1839XA Other fall on same level, initial encounter: Secondary | ICD-10-CM | POA: Diagnosis not present

## 2022-04-02 DIAGNOSIS — I10 Essential (primary) hypertension: Secondary | ICD-10-CM | POA: Diagnosis not present

## 2022-04-02 DIAGNOSIS — M25532 Pain in left wrist: Secondary | ICD-10-CM | POA: Diagnosis present

## 2022-04-02 DIAGNOSIS — S6721XA Crushing injury of right hand, initial encounter: Secondary | ICD-10-CM

## 2022-04-02 DIAGNOSIS — Z79899 Other long term (current) drug therapy: Secondary | ICD-10-CM | POA: Insufficient documentation

## 2022-04-02 MED ORDER — OXYCODONE-ACETAMINOPHEN 5-325 MG PO TABS: 1.0000 | ORAL_TABLET | Freq: Four times a day (QID) | ORAL | 0 refills | Status: DC | PRN

## 2022-04-02 NOTE — ED Triage Notes (Signed)
Pt c/o L hand/wrist pain from a fall that occurred on 03/31/2022. Swelling noted. Denies hitting head, denies LOC, denies blood thinners

## 2022-04-02 NOTE — Discharge Instructions (Signed)
Follow-up with Dr. Harrison next week for recheck 

## 2022-04-02 NOTE — ED Provider Notes (Signed)
[facility] Provider Note   CSN: [id number] Arrival date & time: 04/02/22  0263     History {Add pertinent medical, surgical, social history, OB history to HPI:1} Chief Complaint  Patient presents with   Wrist Pain    Tammy Proctor is a 67 y.o. female.  Patient fell on her left hand a couple days ago   Wrist Pain       Home Medications Prior to Admission medications   Medication Sig Start Date End Date Taking? Authorizing Provider  oxyCODONE-acetaminophen (PERCOCET/ROXICET) 5-325 MG tablet Take 1 tablet by mouth every 6 (six) hours as needed for severe pain. 04/02/22  Yes Milton Ferguson, MD  amLODipine (NORVASC) 10 MG tablet Take 1 tablet (10 mg total) by mouth daily. 01/27/20   Soyla Dryer, PA-C  atorvastatin (LIPITOR) 20 MG tablet Take 1 tablet (20 mg total) by mouth daily. 01/27/20   Soyla Dryer, PA-C  fluticasone (FLONASE) 50 MCG/ACT nasal spray Place 2 sprays into both nostrils daily. 03/12/22   Leath-Warren, Alda Lea, NP  ketotifen (ZADITOR) 0.025 % ophthalmic solution Place 1 drop into both eyes daily as needed (itching and Watery eye).    [provider]  Multiple Vitamins-Minerals (MULTIVITAMIN WITH MINERALS) tablet Take 1 tablet by mouth daily.    [provider]  naproxen sodium (ALEVE) 220 MG tablet Take 220 mg by mouth daily as needed (pain).    [provider]  promethazine-dextromethorphan (PROMETHAZINE-DM) 6.25-15 MG/5ML syrup Take 5 mLs by mouth 4 (four) times daily as needed for cough. 03/12/22   Leath-Warren, Alda Lea, NP  sertraline (ZOLOFT) 25 MG tablet Take 1 tablet (25 mg total) by mouth daily. 12/02/19   Soyla Dryer, PA-C  sodium chloride (OCEAN) 0.65 % SOLN nasal spray Place 1 spray into both nostrils as needed for congestion.    [provider]      Allergies    Ceclor [cefaclor]    Review of Systems   Review of Systems  Physical Exam Updated Vital  Signs BP (!) 169/91 (BP Location: Right Arm)   Pulse 60   Temp 98 F (36.7 C) (Oral)   Resp 18   Ht '5\' 3"'$  (1.6 m)   Wt 54.4 kg   SpO2 99%   BMI 21.24 kg/m  Physical Exam  ED Results / Procedures / Treatments   Labs (all labs ordered are listed, but only abnormal results are displayed) Labs Reviewed - No data to display  EKG None  Radiology DG Hand Complete Left  Result Date: 04/02/2022 CLINICAL DATA:  Left hand pain after a fall. EXAM: LEFT HAND - COMPLETE 3+ VIEW COMPARISON:  None Available. FINDINGS: Small acute avulsion fracture of the ulnar aspect of the third metacarpal head. No additional fracture. No dislocation. Mild degenerative changes of the DIP joints and second CMC joint. Moderate to severe degenerative changes of the first [facility] joint. Bone mineralization is normal. Mild dorsal hand soft tissue swelling. IMPRESSION: 1. Small acute avulsion fracture of the ulnar aspect of the third metacarpal head. 2. 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 Electronically Signed   By: Titus Dubin M.D.   On: 04/02/2022 10:49    Procedures Procedures  {Document cardiac monitor, telemetry assessment procedure when appropriate:1}  Medications Ordered in ED Medications - No data to display  ED Course/ Medical Decision Making/ A&P   {   Click here for ABCD2, HEART and other calculatorsREFRESH Note before signing :1}  Medical Decision Making Amount and/or Complexity of Data Reviewed Radiology: ordered.  Risk Prescription drug management.   Patient has a fracture to the left third meta carpal.  She is going to be splinted and will follow-up with Ortho  {Document critical care time when appropriate:1} {Document review of labs and clinical decision tools ie heart score, Chads2Vasc2 etc:1}  {Document your independent review of radiology images, and any outside records:1} {Document your discussion with family members, caretakers, and with  consultants:1} {Document social determinants of health affecting pt's care:1} {Document your decision making why or why not admission, treatments were needed:1} Final Clinical Impression(s) / ED Diagnoses Final diagnoses:  Crushing injury of right hand, initial encounter    Rx / DC Orders ED Discharge Orders          Ordered    oxyCODONE-acetaminophen (PERCOCET/ROXICET) 5-325 MG tablet  Every 6 hours PRN        04/02/22 1121

## 2022-04-04 ENCOUNTER — Telehealth: Payer: Self-pay | Admitting: Orthopedic Surgery

## 2022-04-04 NOTE — Telephone Encounter (Signed)
Returned the patient's call, lvm that I have scheduled her for 2/08 at 3:00pm, to arrive by 2:30pm.  I asked the patient to call and confirm w/me that she got my message.  Pt's # [phone]

## 2022-04-05 ENCOUNTER — Ambulatory Visit: Payer: Medicare HMO | Admitting: Orthopedic Surgery

## 2022-06-15 ENCOUNTER — Emergency Department (HOSPITAL_COMMUNITY): Payer: Medicare HMO

## 2022-06-15 ENCOUNTER — Observation Stay (HOSPITAL_BASED_OUTPATIENT_CLINIC_OR_DEPARTMENT_OTHER): Payer: Medicare HMO

## 2022-06-15 ENCOUNTER — Observation Stay (HOSPITAL_COMMUNITY)
Admission: EM | Admit: 2022-06-15 | Discharge: 2022-06-16 | Disposition: A | Discharge status: Home / Self Care | Payer: Medicare HMO | Attending: Family Medicine | Admitting: Family Medicine

## 2022-06-15 ENCOUNTER — Encounter (HOSPITAL_COMMUNITY): Payer: Self-pay | Admitting: Emergency Medicine

## 2022-06-15 ENCOUNTER — Other Ambulatory Visit: Payer: Self-pay

## 2022-06-15 DIAGNOSIS — Z79899 Other long term (current) drug therapy: Secondary | ICD-10-CM | POA: Diagnosis not present

## 2022-06-15 DIAGNOSIS — E785 Hyperlipidemia, unspecified: Secondary | ICD-10-CM | POA: Diagnosis present

## 2022-06-15 DIAGNOSIS — R519 Headache, unspecified: Secondary | ICD-10-CM | POA: Diagnosis not present

## 2022-06-15 DIAGNOSIS — R0789 Other chest pain: Principal | ICD-10-CM | POA: Insufficient documentation

## 2022-06-15 DIAGNOSIS — I10 Essential (primary) hypertension: Secondary | ICD-10-CM | POA: Diagnosis not present

## 2022-06-15 DIAGNOSIS — F17210 Nicotine dependence, cigarettes, uncomplicated: Secondary | ICD-10-CM | POA: Insufficient documentation

## 2022-06-15 DIAGNOSIS — R918 Other nonspecific abnormal finding of lung field: Secondary | ICD-10-CM

## 2022-06-15 DIAGNOSIS — J449 Chronic obstructive pulmonary disease, unspecified: Secondary | ICD-10-CM | POA: Insufficient documentation

## 2022-06-15 DIAGNOSIS — R079 Chest pain, unspecified: Secondary | ICD-10-CM

## 2022-06-15 DIAGNOSIS — Z720 Tobacco use: Secondary | ICD-10-CM

## 2022-06-15 DIAGNOSIS — Z23 Encounter for immunization: Secondary | ICD-10-CM | POA: Diagnosis not present

## 2022-06-15 DIAGNOSIS — R911 Solitary pulmonary nodule: Secondary | ICD-10-CM | POA: Insufficient documentation

## 2022-06-15 DIAGNOSIS — K6389 Other specified diseases of intestine: Secondary | ICD-10-CM

## 2022-06-15 DIAGNOSIS — K633 Ulcer of intestine: Secondary | ICD-10-CM | POA: Diagnosis not present

## 2022-06-15 LAB — CBC
HCT: 38.1 % (ref 36.0–46.0)
Hemoglobin: 12.9 g/dL (ref 12.0–15.0)
MCH: 33.2 pg (ref 26.0–34.0)
MCHC: 33.9 g/dL (ref 30.0–36.0)
MCV: 98.2 fL (ref 80.0–100.0)
Platelets: 209 10*3/uL (ref 150–400)
RBC: 3.88 MIL/uL (ref 3.87–5.11)
RDW: 14.6 % (ref 11.5–15.5)
WBC: 5.2 10*3/uL (ref 4.0–10.5)
nRBC: 0 % (ref 0.0–0.2)

## 2022-06-15 LAB — ECHOCARDIOGRAM COMPLETE
AR max vel: 1.01 cm2
AV Area VTI: 1.32 cm2
AV Area mean vel: 1.07 cm2
AV Mean grad: 3.7 mmHg
AV Peak grad: 8.3 mmHg
Ao pk vel: 1.44 m/s
Area-P 1/2: 3.37 cm2
Height: 63 in
MV M vel: 1.05 m/s
MV Peak grad: 4.4 mmHg
S' Lateral: 2.4 cm
Weight: 1664.91 oz

## 2022-06-15 LAB — LIPID PANEL
Cholesterol: 293 mg/dL — ABNORMAL HIGH (ref 0–200)
HDL: >135 mg/dL (ref >40)
Triglycerides: 44 mg/dL (ref <150)
VLDL: 9 mg/dL (ref 0–40)

## 2022-06-15 LAB — COMPREHENSIVE METABOLIC PANEL
ALT: 26 U/L (ref 0–44)
AST: 42 U/L — ABNORMAL HIGH (ref 15–41)
Albumin: 4.3 g/dL (ref 3.5–5.0)
Alkaline Phosphatase: 58 U/L (ref 38–126)
Anion gap: 13 (ref 5–15)
BUN: 12 mg/dL (ref 8–23)
CO2: 20 mmol/L — ABNORMAL LOW (ref 22–32)
Calcium: 9.2 mg/dL (ref 8.9–10.3)
Chloride: 104 mmol/L (ref 98–111)
Creatinine, Ser: 0.73 mg/dL (ref 0.44–1.00)
GFR, Estimated: >60 mL/min (ref >60)
Glucose, Bld: 78 mg/dL (ref 70–99)
Potassium: 4.1 mmol/L (ref 3.5–5.1)
Sodium: 137 mmol/L (ref 135–145)
Total Bilirubin: 0.8 mg/dL (ref 0.3–1.2)
Total Protein: 7.1 g/dL (ref 6.5–8.1)

## 2022-06-15 LAB — TROPONIN I (HIGH SENSITIVITY)
Troponin I (High Sensitivity): 7 ng/L (ref <18)
Troponin I (High Sensitivity): 8 ng/L (ref <18)

## 2022-06-15 LAB — RAPID URINE DRUG SCREEN, HOSP PERFORMED
Amphetamines: NOT DETECTED
Barbiturates: NOT DETECTED
Benzodiazepines: NOT DETECTED
Cocaine: NOT DETECTED
Opiates: POSITIVE — AB
Tetrahydrocannabinol: POSITIVE — AB

## 2022-06-15 LAB — D-DIMER, QUANTITATIVE: D-Dimer, Quant: 0.75 ug/mL-FEU — ABNORMAL HIGH (ref 0.00–0.50)

## 2022-06-15 MED ORDER — ONDANSETRON HCL 4 MG PO TABS: 4.0000 mg | ORAL_TABLET | Freq: Four times a day (QID) | ORAL | Status: DC | PRN

## 2022-06-15 MED ORDER — ACETAMINOPHEN 325 MG PO TABS
650.0000 mg | ORAL_TABLET | Freq: Four times a day (QID) | ORAL | Status: DC | PRN
  Administered 2022-06-15 – 2022-06-16 (×2): 650 mg via ORAL
  Filled 2022-06-15 (×2): qty 2

## 2022-06-15 MED ORDER — SODIUM CHLORIDE 0.9% FLUSH
3.0000 mL | Freq: Two times a day (BID) | INTRAVENOUS | Status: DC
  Administered 2022-06-15 – 2022-06-16 (×2): 3 mL via INTRAVENOUS

## 2022-06-15 MED ORDER — ACETAMINOPHEN 650 MG RE SUPP: 650.0000 mg | Freq: Four times a day (QID) | RECTAL | Status: DC | PRN

## 2022-06-15 MED ORDER — IOHEXOL 350 MG/ML SOLN
75.0000 mL | Freq: Once | INTRAVENOUS | Status: AC | PRN
  Administered 2022-06-15: 75 mL via INTRAVENOUS

## 2022-06-15 MED ORDER — MORPHINE SULFATE (PF) 2 MG/ML IV SOLN: 2.0000 mg | INTRAVENOUS | Status: DC | PRN

## 2022-06-15 MED ORDER — TRAZODONE HCL 50 MG PO TABS
50.0000 mg | ORAL_TABLET | Freq: Every day | ORAL | Status: DC
  Administered 2022-06-15: 50 mg via ORAL
  Filled 2022-06-15: qty 1

## 2022-06-15 MED ORDER — ORAL CARE MOUTH RINSE: 15.0000 mL | OROMUCOSAL | Status: DC | PRN

## 2022-06-15 MED ORDER — SODIUM CHLORIDE 0.9 % IV SOLN: INTRAVENOUS | Status: DC | PRN

## 2022-06-15 MED ORDER — ONDANSETRON HCL 4 MG/2ML IJ SOLN: 4.0000 mg | Freq: Four times a day (QID) | INTRAMUSCULAR | Status: DC | PRN

## 2022-06-15 MED ORDER — BISACODYL 10 MG RE SUPP: 10.0000 mg | Freq: Every day | RECTAL | Status: DC | PRN

## 2022-06-15 MED ORDER — PNEUMOCOCCAL 20-VAL CONJ VACC 0.5 ML IM SUSY
0.5000 mL | PREFILLED_SYRINGE | INTRAMUSCULAR | Status: AC
  Administered 2022-06-16: 0.5 mL via INTRAMUSCULAR
  Filled 2022-06-15: qty 0.5

## 2022-06-15 MED ORDER — NITROGLYCERIN 0.4 MG SL SUBL
0.4000 mg | SUBLINGUAL_TABLET | SUBLINGUAL | Status: DC | PRN
  Administered 2022-06-15: 0.4 mg via SUBLINGUAL
  Filled 2022-06-15: qty 1

## 2022-06-15 MED ORDER — ISOSORBIDE MONONITRATE ER 60 MG PO TB24
30.0000 mg | ORAL_TABLET | Freq: Every day | ORAL | Status: DC
  Administered 2022-06-15: 30 mg via ORAL
  Filled 2022-06-15: qty 1

## 2022-06-15 MED ORDER — ACETAMINOPHEN-CAFFEINE 500-65 MG PO TABS
1.0000 | ORAL_TABLET | Freq: Once | ORAL | Status: DC
  Filled 2022-06-15: qty 1

## 2022-06-15 MED ORDER — ASPIRIN 81 MG PO TBEC
81.0000 mg | DELAYED_RELEASE_TABLET | Freq: Every day | ORAL | Status: DC
  Administered 2022-06-16: 81 mg via ORAL
  Filled 2022-06-15: qty 1

## 2022-06-15 MED ORDER — MORPHINE SULFATE (PF) 4 MG/ML IV SOLN
4.0000 mg | Freq: Once | INTRAVENOUS | Status: AC
  Administered 2022-06-15: 4 mg via INTRAVENOUS
  Filled 2022-06-15: qty 1

## 2022-06-15 MED ORDER — AMLODIPINE BESYLATE 5 MG PO TABS
10.0000 mg | ORAL_TABLET | Freq: Every day | ORAL | Status: DC
  Administered 2022-06-15 – 2022-06-16 (×2): 10 mg via ORAL
  Filled 2022-06-15 (×2): qty 2

## 2022-06-15 MED ORDER — NICOTINE 21 MG/24HR TD PT24
21.0000 mg | MEDICATED_PATCH | Freq: Every day | TRANSDERMAL | Status: DC
  Administered 2022-06-15 – 2022-06-16 (×2): 21 mg via TRANSDERMAL
  Filled 2022-06-15 (×2): qty 1

## 2022-06-15 MED ORDER — TRAZODONE HCL 50 MG PO TABS: 50.0000 mg | ORAL_TABLET | Freq: Every evening | ORAL | Status: DC | PRN

## 2022-06-15 MED ORDER — SERTRALINE HCL 50 MG PO TABS
25.0000 mg | ORAL_TABLET | Freq: Every day | ORAL | Status: DC
  Administered 2022-06-15 – 2022-06-16 (×2): 25 mg via ORAL
  Filled 2022-06-15 (×2): qty 1

## 2022-06-15 MED ORDER — IOHEXOL 300 MG/ML  SOLN: 75.0000 mL | Freq: Once | INTRAMUSCULAR | Status: DC | PRN

## 2022-06-15 MED ORDER — SODIUM CHLORIDE 0.9% FLUSH: 3.0000 mL | INTRAVENOUS | Status: DC | PRN

## 2022-06-15 MED ORDER — ATORVASTATIN CALCIUM 20 MG PO TABS
20.0000 mg | ORAL_TABLET | Freq: Every day | ORAL | Status: DC
  Administered 2022-06-15 – 2022-06-16 (×2): 20 mg via ORAL
  Filled 2022-06-15 (×2): qty 1

## 2022-06-15 MED ORDER — HYDRALAZINE HCL 20 MG/ML IJ SOLN: 10.0000 mg | Freq: Four times a day (QID) | INTRAMUSCULAR | Status: DC | PRN

## 2022-06-15 MED ORDER — POLYETHYLENE GLYCOL 3350 17 G PO PACK: 17.0000 g | PACK | Freq: Every day | ORAL | Status: DC | PRN

## 2022-06-15 MED ORDER — HEPARIN SODIUM (PORCINE) 5000 UNIT/ML IJ SOLN
5000.0000 [IU] | Freq: Three times a day (TID) | INTRAMUSCULAR | Status: DC
  Administered 2022-06-15 – 2022-06-16 (×2): 5000 [IU] via SUBCUTANEOUS
  Filled 2022-06-15 (×3): qty 1

## 2022-06-15 NOTE — Progress Notes (Signed)
  Echocardiogram 2D Echocardiogram has been performed.  Tammy Proctor 06/15/2022, 3:47 PM

## 2022-06-15 NOTE — ED Provider Notes (Signed)
[place] [facility] DE[facility]MENT AT [facility] Provider Note   CSN: [id number] Arrival date & time: 06/15/22  4098     History  Chief Complaint  Patient presents with   Chest Pain    Tammy Proctor is a 67 y.o. female.  She is brought in by ambulance for an episode of chest pain.  She said around 4 AM she had left-sided chest dull pain that was severe and radiated through to her back.  It was associated with shortness of breath and nausea.  She has never had this before.  It woke her up.  She currently rates it as 7 out of 10.  EMS was called and they gave her aspirin.  She is a smoker.  She denies any abdominal pain vomiting diarrhea urinary symptoms.  No prior history of cardiac disease.  The history is provided by the patient.  Chest Pain Pain location:  L chest Pain quality: dull   Pain radiates to:  Mid back Pain severity:  Severe Onset quality:  Sudden Duration:  4 hours Timing:  Constant Progression:  Improving Chronicity:  New Context: at rest   Relieved by:  None tried Worsened by:  Nothing Ineffective treatments:  None tried Associated symptoms: back pain, cough, nausea and shortness of breath   Associated symptoms: no abdominal pain, no diaphoresis, no fever and no vomiting   Risk factors: high cholesterol, hypertension and smoking        Home Medications Prior to Admission medications   Medication Sig Start Date End Date Taking? Authorizing Provider  amLODipine (NORVASC) 10 MG tablet Take 1 tablet (10 mg total) by mouth daily. 01/27/20   Jacquelin [facility], PA-C  atorvastatin (LIPITOR) 20 MG tablet Take 1 tablet (20 mg total) by mouth daily. 01/27/20   Jacquelin [facility], PA-C  fluticasone (FLONASE) 50 MCG/ACT nasal spray Place 2 sprays into both nostrils daily. 03/12/22   Leath-Warren, Sadie Haber, NP  ketotifen (ZADITOR) 0.025 % ophthalmic solution Place 1 drop into both eyes daily as needed (itching and Watery eye).    [provider]   Multiple Vitamins-Minerals (MULTIVITAMIN WITH MINERALS) tablet Take 1 tablet by mouth daily.    [provider]  naproxen sodium (ALEVE) 220 MG tablet Take 220 mg by mouth daily as needed (pain).    [provider]  oxyCODONE-acetaminophen (PERCOCET/ROXICET) 5-325 MG tablet Take 1 tablet by mouth every 6 (six) hours as needed for severe pain. 04/02/22   Bethann Berkshire, MD  promethazine-dextromethorphan (PROMETHAZINE-DM) 6.25-15 MG/5ML syrup Take 5 mLs by mouth 4 (four) times daily as needed for cough. 03/12/22   Leath-Warren, Sadie Haber, NP  sertraline (ZOLOFT) 25 MG tablet Take 1 tablet (25 mg total) by mouth daily. 12/02/19   Jacquelin Hawking, PA-C  sodium chloride (OCEAN) 0.65 % SOLN nasal spray Place 1 spray into both nostrils as needed for congestion.    [provider]      Allergies    Ceclor [cefaclor]    Review of Systems   Review of Systems  Constitutional:  Negative for diaphoresis and fever.  Eyes:  Negative for visual disturbance.  Respiratory:  Positive for cough and shortness of breath.   Cardiovascular:  Positive for chest pain.  Gastrointestinal:  Positive for nausea. Negative for abdominal pain and vomiting.  Musculoskeletal:  Positive for back pain.    Physical Exam Updated Vital Signs BP (!) 169/88 (BP Location: Right Arm)   Pulse 76   Temp 98.7 F (37.1 C) (Oral)  Resp 12  Physical Exam Vitals and nursing note reviewed.  Constitutional:      General: She is not in acute distress.    Appearance: Normal appearance. She is well-developed.  HENT:     Head: Normocephalic and atraumatic.  Eyes:     Conjunctiva/sclera: Conjunctivae normal.  Cardiovascular:     Rate and Rhythm: Normal rate and regular rhythm.     Heart sounds: Normal heart sounds. No murmur heard. Pulmonary:     Effort: Pulmonary effort is normal. No respiratory distress.     Breath sounds: Normal breath sounds.  Abdominal:     Palpations: Abdomen is soft.      Tenderness: There is no abdominal tenderness. There is no guarding or rebound.  Musculoskeletal:        General: No swelling.     Cervical back: Neck supple.     Right lower leg: No tenderness. No edema.     Left lower leg: No tenderness. No edema.  Skin:    General: Skin is warm and dry.     Capillary Refill: Capillary refill takes less than 2 seconds.  Neurological:     General: No focal deficit present.     Mental Status: She is alert.     ED Results / Procedures / Treatments   Labs (all labs ordered are listed, but only abnormal results are displayed) Labs Reviewed  D-DIMER, QUANTITATIVE - Abnormal; Notable for the following components:      Result Value   D-Dimer, Quant 0.75 (*)    All other components within normal limits  COMPREHENSIVE METABOLIC PANEL - Abnormal; Notable for the following components:   CO2 20 (*)    AST 42 (*)    All other components within normal limits  RAPID URINE DRUG SCREEN, HOSP PERFORMED - Abnormal; Notable for the following components:   Opiates POSITIVE (*)    Tetrahydrocannabinol POSITIVE (*)    All other components within normal limits  LIPID PANEL - Abnormal; Notable for the following components:   Cholesterol 293 (*)    All other components within normal limits  CBC  HIV ANTIBODY (ROUTINE TESTING W REFLEX)  TROPONIN I (HIGH SENSITIVITY)  TROPONIN I (HIGH SENSITIVITY)    EKG EKG Interpretation  Date/Time:  Friday June 15 2022 08:30:35 EDT Ventricular Rate:  80 PR Interval:  146 QRS Duration: 80 QT Interval:  371 QTC Calculation: 428 R Axis:   88 Text Interpretation: Sinus rhythm Borderline right axis deviation Abnrm T, consider ischemia, anterolateral lds peaked t waves diffuse new from prior 5/20 Confirmed by Meridee Score [id number]) on 06/15/2022 8:46:45 AM  Radiology ECHOCARDIOGRAM COMPLETE  Result Date: 06/15/2022    ECHOCARDIOGRAM REPORT   Patient Name:   Tammy Proctor Date of Exam: 06/15/2022 Medical Rec #:  [id number]           Height:       63.0 in Accession #:    [id number]         Weight:       104.1 lb Date of Birth:  05-18-1955          BSA:          1.465 m Patient Age:    66 years           BP:           151/85 mmHg Patient Gender: F                  HR:  62 bpm. [facility] Procedure: 2D Echo, Cardiac Doppler and Color Doppler Indications:    Chest Pain R07.9  History:        Patient has no prior history of Echocardiogram examinations.                 Signs/Symptoms:Chest Pain; Risk Factors:Hypertension, Current                 Smoker and Dyslipidemia.  Sonographer:    Aron Baba Referring Phys: [id number] Dorothe Pea BRANCH  Sonographer Comments: No parasternal window and no apical window. Image acquisition challenging due to patient body habitus and Image acquisition challenging due to respiratory motion. IMPRESSIONS  1. Left ventricular ejection fraction, by estimation, is 60 to 65%. The left ventricle has normal function. The left ventricle has no regional wall motion abnormalities. Left ventricular diastolic parameters are consistent with Grade I diastolic dysfunction (impaired relaxation).  2. Right ventricular systolic function is normal. The right ventricular size is normal. Tricuspid regurgitation signal is inadequate for assessing PA pressure.  3. The mitral valve is normal in structure. No evidence of mitral valve regurgitation. No evidence of mitral stenosis.  4. The tricuspid valve is abnormal.  5. The aortic valve is tricuspid. Aortic valve regurgitation is not visualized. No aortic stenosis is present.  6. The inferior vena cava is normal in size with greater than 50% respiratory variability, suggesting right atrial pressure of 3 mmHg. FINDINGS  Left Ventricle: Left ventricular ejection fraction, by estimation, is 60 to 65%. The left ventricle has normal function. The left ventricle has no regional wall motion abnormalities. The left ventricular internal cavity size was normal in size.  There is  no left ventricular hypertrophy. Left ventricular diastolic parameters are consistent with Grade I diastolic dysfunction (impaired relaxation). Normal left ventricular filling pressure. Right Ventricle: The right ventricular size is normal. Right vetricular wall thickness was not well visualized. Right ventricular systolic function is normal. Tricuspid regurgitation signal is inadequate for assessing PA pressure. Left Atrium: Left atrial size was normal in size. Right Atrium: Right atrial size was normal in size. Pericardium: There is no evidence of pericardial effusion. Mitral Valve: The mitral valve is normal in structure. No evidence of mitral valve regurgitation. No evidence of mitral valve stenosis. Tricuspid Valve: The tricuspid valve is abnormal. Tricuspid valve regurgitation is mild . No evidence of tricuspid stenosis. Aortic Valve: The aortic valve is tricuspid. Aortic valve regurgitation is not visualized. No aortic stenosis is present. Aortic valve mean gradient measures 3.7 mmHg. Aortic valve peak gradient measures 8.3 mmHg. Aortic valve area, by VTI measures 1.32 cm. Pulmonic Valve: The pulmonic valve was not well visualized. Pulmonic valve regurgitation is not visualized. No evidence of pulmonic stenosis. Aorta: The aortic root was not well visualized. Venous: The inferior vena cava is normal in size with greater than 50% respiratory variability, suggesting right atrial pressure of 3 mmHg. IAS/Shunts: No atrial level shunt detected by color flow Doppler.  LEFT VENTRICLE PLAX 2D LVIDd:         3.90 cm   Diastology LVIDs:         2.40 cm   LV e' medial:    9.03 cm/s LV PW:         0.80 cm   LV E/e' medial:  7.2 LV IVS:        0.80 cm   LV e' lateral:   9.90 cm/s LVOT diam:     1.50 cm  LV E/e' lateral: 6.5 LV SV:         42 LV SV Index:   29 LVOT Area:     1.77 cm  LEFT ATRIUM             Index        RIGHT ATRIUM           Index LA diam:        3.50 cm 2.39 cm/m   RA Area:     14.40 cm LA  Vol (A2C):   28.8 ml 19.66 ml/m  RA Volume:   37.70 ml  25.74 ml/m LA Vol (A4C):   48.4 ml 33.04 ml/m LA Biplane Vol: 39.1 ml 26.69 ml/m  AORTIC VALVE AV Area (Vmax):    1.01 cm AV Area (Vmean):   1.07 cm AV Area (VTI):     1.32 cm AV Vmax:           144.47 cm/s AV Vmean:          90.207 cm/s AV VTI:            0.318 m AV Peak Grad:      8.3 mmHg AV Mean Grad:      3.7 mmHg LVOT Vmax:         82.30 cm/s LVOT Vmean:        54.400 cm/s LVOT VTI:          0.237 m LVOT/AV VTI ratio: 0.75  AORTA Ao Root diam: 3.80 cm MITRAL VALVE MV Area (PHT): 3.37 cm    SHUNTS MV Decel Time: 225 msec    Systemic VTI:  0.24 m MR Peak grad: 4.4 mmHg     Systemic Diam: 1.50 cm MR Vmax:      105.00 cm/s MV E velocity: 64.70 cm/s MV A velocity: 74.10 cm/s MV E/A ratio:  0.87 Dina Rich MD Electronically signed by Dina Rich MD Signature Date/Time: 06/15/2022/4:04:54 PM    Final    CT Angio Chest PE W/Cm &/Or Wo Cm  Result Date: 06/15/2022 CLINICAL DATA:  Chest pain. Left-sided chest tightness since this morning. Positive D-dimer EXAM: CT ANGIOGRAPHY CHEST WITH CONTRAST TECHNIQUE: Multidetector CT imaging of the chest was performed using the standard protocol during bolus administration of intravenous contrast. Multiplanar CT image reconstructions and MIPs were obtained to evaluate the vascular anatomy. RADIATION DOSE REDUCTION: This exam was performed according to the departmental dose-optimization program which includes automated exposure control, adjustment of the mA and/or kV according to patient size and/or use of iterative reconstruction technique. CONTRAST:  75mL OMNIPAQUE IOHEXOL 350 MG/ML SOLN COMPARISON:  Chest x-ray earlier 06/15/2022. FINDINGS: Cardiovascular: No pulmonary embolism identified. No pericardial effusion. Heart has a normal course and caliber. Coronary artery calcifications are seen. Please correlate for other coronary risk factors. There are some scattered vascular calcifications and  noncalcified plaque along the normal caliber thoracic aorta. Mediastinum/Nodes: Slightly patulous thoracic esophagus. No specific abnormal lymph node enlargement identified in the axillary region, hilum or mediastinum. Preserved thyroid gland. Lungs/Pleura: There is some linear opacity lung bases likely scar or atelectasis. No consolidation, pneumothorax or effusion. Tiny nodule seen in the posterior right upper lobe measuring 3 mm on series 6, image 42. This may be cavitary. Similar 3 mm nodule in the right upper lobe in the midportion on image 37 of series 6 as well Upper Abdomen: The adrenal glands are preserved. There is a large cystic focus involving the left kidney. 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 Musculoskeletal: Curvature and degenerative changes along the spine. Review of the MIP images confirms the above findings. IMPRESSION: No pulmonary embolism identified. Two tiny right-sided upper lobe lung nodules. One of which is cavitary. Non-contrast chest CT at 3-6 months is recommended. If nodules persist and are stable at that time, consider additional non-contrast chest CT examinations at 2 and 4 years. This recommendation follows the consensus statement: Guidelines for Management of Incidental Pulmonary Nodules Detected on CT Images: From the Fleischner Society 2017; Radiology 2017; 284:228-243. Electronically Signed   By: Karen Kays M.D.   On: 06/15/2022 10:24   DG Chest Port 1 View  Result Date: 06/15/2022 CLINICAL DATA:  Chest pain. EXAM: PORTABLE CHEST 1 VIEW COMPARISON:  Jun 30, 2018. FINDINGS: The heart size and mediastinal contours are within normal limits. Nodular density seen in left lower lobe which may represent overlying nipple shadow. Lobular density is noted in right lower lobe which also may represent overlying nipple shadow. No consolidative process is noted. The visualized skeletal structures are  unremarkable. IMPRESSION: Densities are seen projected over both lower lobes which may represent overlying nipple shadow, but pulmonary nodules or neoplasm cannot be excluded. Repeat radiograph with nipple markers is recommended. Electronically Signed   By: Lupita Raider M.D.   On: 06/15/2022 09:13    Procedures Procedures    Medications Ordered in ED Medications  nitroGLYCERIN (NITROSTAT) SL tablet 0.4 mg (0.4 mg Sublingual Given 06/15/22 0901)  pneumococcal 20-valent conjugate vaccine (PREVNAR 20) injection 0.5 mL (has no administration in time range)  Oral care mouth rinse (has no administration in time range)  amLODipine (NORVASC) tablet 10 mg (10 mg Oral Given 06/15/22 1531)  atorvastatin (LIPITOR) tablet 20 mg (20 mg Oral Given 06/15/22 1531)  sertraline (ZOLOFT) tablet 25 mg (25 mg Oral Given 06/15/22 1531)  sodium chloride flush (NS) 0.9 % injection 3 mL (has no administration in time range)  sodium chloride flush (NS) 0.9 % injection 3 mL (has no administration in time range)  sodium chloride flush (NS) 0.9 % injection 3 mL (has no administration in time range)  0.9 %  sodium chloride infusion (has no administration in time range)  acetaminophen (TYLENOL) tablet 650 mg (650 mg Oral Given 06/15/22 1531)    Or  acetaminophen (TYLENOL) suppository 650 mg ( Rectal See Alternative 06/15/22 1531)  traZODone (DESYREL) tablet 50 mg (has no administration in time range)  polyethylene glycol (MIRALAX / GLYCOLAX) packet 17 g (has no administration in time range)  bisacodyl (DULCOLAX) suppository 10 mg (has no administration in time range)  ondansetron (ZOFRAN) tablet 4 mg (has no administration in time range)    Or  ondansetron (ZOFRAN) injection 4 mg (has no administration in time range)  heparin injection 5,000 Units (5,000 Units Subcutaneous Given 06/15/22 1716)  aspirin EC tablet 81 mg (has no administration in time range)  hydrALAZINE (APRESOLINE) injection 10 mg (has no administration in  time range)  isosorbide mononitrate (IMDUR) 24 hr tablet 30 mg (30 mg Oral Given 06/15/22 1716)  morphine (PF) 4 MG/ML injection 4 mg (4 mg Intravenous Given 06/15/22 0901)  iohexol (OMNIPAQUE) 350 MG/ML injection 75 mL (75 mLs Intravenous Contrast Given 06/15/22 1004)    ED Course/ Medical Decision Making/ A&P Clinical Course as of 06/15/22 1733  Fri Jun 15, 2022  0902 Chest x-ray interpreted by me as COPD no clear infiltrate.  Awaiting radiology  reading. [MB]  1159 Discussed with [facility] hospitalist Dr. Mariea Clonts.  He asked if cardiology can weigh in if patient should stay here versus [facility] as there is no cardiology coverage over the weekend.  I reviewed this with Dr. Wyline Mood cardiology and he said he would consult on the patient and make recommendations. [MB]    Clinical Course User Index [MB] Terrilee Files, MD                             Medical Decision Making Amount and/or Complexity of Data Reviewed Labs: ordered. Radiology: ordered.  Risk Prescription drug management. Decision regarding hospitalization.   This patient complains of chest pain; this involves an extensive number of treatment Options and is a complaint that carries with it a high risk of complications and morbidity. The differential includes ACS, pneumonia, pneumothorax, vascular, PE, reflux  I ordered, reviewed and interpreted labs, which included CBC with normal white count normal hemoglobin, chemistries normal other than low bicarb, troponins flat, D-dimer positive I ordered medication nitroglycerin IV pain medication and reviewed PMP when indicated. I ordered imaging studies which included chest x-ray, CT angio and I independently    visualized and interpreted imaging which showed no evidence of PE.  Does have pulmonary nodules I will need follow-up and this is reviewed with patient. Additional history obtained from EMS Previous records obtained and reviewed in epic, no prior cardiac workups I consulted Dr.  Wyline Mood cardiology and Dr. Mariea Clonts [facility] hospitalist and discussed lab and imaging findings and discussed disposition.  Cardiac monitoring reviewed, normal sinus rhythm Social determinants considered, ongoing tobacco use and utilities insecurity Critical Interventions: None  After the interventions stated above, I reevaluated the patient and found patient still to be in pain although looks very comfortable with stable vitals Admission and further testing considered, she has an elevated heart score would benefit from admission to the hospital for further cardiac workup.  Patient in agreement with plan for admission.         Final Clinical Impression(s) / ED Diagnoses Final diagnoses:  Pulmonary nodules  Atypical chest pain    Rx / DC Orders ED Discharge Orders     None         Terrilee Files, MD 06/15/22 1736

## 2022-06-15 NOTE — H&P (Signed)
Patient Demographics:    Tammy Proctor, is a 67 y.o. female  MRN: [id number]   DOB - 26-Oct-1955  Admit Date - 06/15/2022  Outpatient Primary MD for the patient is System, Provider Not In   Assessment & Plan:   Assessment and Plan:  1)Atypical Chest Pain-  Cardiovascular risk factors include tobacco abuse, age, status post menopause, HTN, dyslipidemia,  Place in Observation status on  telemetry monitored unit, check serial troponins and EKG to rule out acute coronary syndrome .  If patient rules out for ACS, will need further cardiovascular risk stratification . Cardiology consult to help decide if Stress test is needed in am Versus other diagnostic modalities.   Give aspirin, Lipitor nitroglycerin,  -Cardiology consult appreciated -Be judicious with IV morphine for pain control -Troponin and EKG without acute findings -Echocardiogram pending  2) ulcerated mass of the transverse colon--diagnosed initially in 2021, subsequent colonoscopy with pathology in 2020 to confirm it  -patient was advised to have partial colectomy with general surgery patient never followed up -No obstructive symptoms at this time -Hgb WNL -No bleeding concerns at this time  3) COPD/tobacco abuse--- smoking cessation advised, nicotine patch as ordered -Bronchodilators as ordered  4)HTN--amlodipine 10 mg daily -IV hydralazine as needed elevated BP  5) headaches--- presumed caffeine withdrawal headaches -Symptomatic management as ordered  6) depression----restart Zoloft daily and give trazodone nightly  Dispo: The patient is from: Home              Anticipated d/c is to: Home              Anticipated d/c date is: 1 day              Patient currently is not medically stable to d/c. Barriers: Not Clinically Stable-   With History of  - Reviewed by me  Past Medical History:  Diagnosis Date   Anxiety    HLD (hyperlipidemia)    Hypertension    Migraine    resolved      Past Surgical History:  Procedure Laterality Date   BIOPSY  01/25/2020   Procedure: BIOPSY;  Surgeon: Lanelle Bal, DO;  Location: [facility];  Service: Endoscopy;;   BREAST EXCISIONAL BIOPSY Right    benign   CATARACT EXTRACTION Right    COLONOSCOPY WITH PROPOFOL N/A 01/25/2020   Procedure: COLONOSCOPY WITH PROPOFOL;  Surgeon: Lanelle Bal, DO;  Location: [facility];  Service: Endoscopy;  Laterality: N/A;  9:00am   COLONOSCOPY WITH PROPOFOL N/A 03/03/2020   Procedure: COLONOSCOPY WITH PROPOFOL;  Surgeon: Lanelle Bal, DO;  Location: [facility];  Service: Endoscopy;  Laterality: N/A;  10:15am   NASAL SINUS SURGERY     POLYPECTOMY  03/03/2020   Procedure: POLYPECTOMY;  Surgeon: Lanelle Bal, DO;  Location: [facility];  Service: Endoscopy;;   TUBAL LIGATION        Chief Complaint  Patient presents with  Chest Pain      HPI:    Tammy Proctor  is a 67 y.o. female with past medical history relevant for tobacco abuse, HLD, HTN and transverse colon mass presents with left-sided chest pain since 4 AM on 06/15/2022 -Chest pain radiates to her back associated with nausea cough and dyspnea -She does have chronic cough,  and dyspnea due to heavy smoking/COPD -No fever  Or chills  -Chest pain was associated with nausea but no emesis , patient had palpitations, no dizziness  -No flank pain or urinary symptoms -Patient drinks a pot of coffee a day states that she is having coffee withdrawal headaches -EKG sinus rhythm with nonspecific T wave changes -CTA chest without PE but patient does have 2 tiny right-sided upper lobe nodules 1 of which is cavitary -Total cholesterol 293, HDL greater than 135 = CBC WNL -Troponin 7, repeat troponin 8 UDS -with THC -AST 42, otherwise LFTs WNL, creatinine 0.73, potassium 4.1    Review of systems:    In addition to the HPI above,   A full Review of  Systems was done, all other systems reviewed are negative except as noted above in HPI , .    Social History:  Reviewed by me    Social History   Tobacco Use   Smoking status: Every Day    Packs/day: 0.25    Years: 52.00    Additional pack years: 0.00    Total pack years: 13.00    Types: Cigarettes    Start date: 08/12/1974   Smokeless tobacco: Never  Substance Use Topics   Alcohol use: Yes    Comment: occasional       Family History :  Reviewed by me    Family History  Problem Relation Age of Onset   Cancer Mother        breast   Diabetes Mother    Heart disease Father    Mental illness Father    Heart disease Sister    Diabetes Brother    Diabetes Sister    Colon cancer Neg Hx      Home Medications:   Prior to Admission medications   Medication Sig Start Date End Date Taking? Authorizing Provider  amLODipine (NORVASC) 10 MG tablet Take 1 tablet (10 mg total) by mouth daily. Patient not taking: Reported on 06/15/2022 01/27/20   Jacquelin [facility], PA-C  atorvastatin (LIPITOR) 20 MG tablet Take 1 tablet (20 mg total) by mouth daily. Patient not taking: Reported on 06/15/2022 01/27/20   Jacquelin [facility], PA-C  fluticasone [facility]) 50 MCG/ACT nasal spray Place 2 sprays into both nostrils daily. Patient not taking: Reported on 06/15/2022 03/12/22   Leath-Warren, Sadie Haber, NP  oxyCODONE-acetaminophen (PERCOCET/ROXICET) 5-325 MG tablet Take 1 tablet by mouth every 6 (six) hours as needed for severe pain. Patient not taking: Reported on 06/15/2022 04/02/22   Bethann Berkshire, MD  promethazine-dextromethorphan (PROMETHAZINE-DM) 6.25-15 MG/5ML syrup Take 5 mLs by mouth 4 (four) times daily as needed for cough. Patient not taking: Reported on 06/15/2022 03/12/22   Leath-Warren, Sadie Haber, NP  sertraline (ZOLOFT) 25 MG tablet Take 1 tablet (25 mg total) by mouth daily. Patient not taking: Reported on  06/15/2022 12/02/19   Jacqu[facility], PA-C     Allergies:     Allergies  Allergen Reactions   Ceclor [Cefaclor] Hives     Physical Exam:   Vitals  Blood pressure (!) 163/82, pulse 77, temperature 99.4 F (37.4 C), temperature source Oral, resp. rate 20, height  5\' 3"  (1.6 m), weight 47.2 kg, SpO2 97 %.  Physical Examination: General appearance - alert,  in no distress Mental status - alert, oriented to person, place, and time,  Eyes - sclera anicteric Neck - supple, no JVD elevation , Chest - clear  to auscultation bilaterally, symmetrical air movement,  Heart - S1 and S2 normal, regular , inconsistently reproducible chest wall discomfort Abdomen - soft, nontender, nondistended, +BS Neurological - screening mental status exam normal, neck supple without rigidity, cranial nerves II through XII intact, DTR's normal and symmetric Extremities - no pedal edema noted, intact peripheral pulses  Skin - warm, dry   Data Review:    CBC Recent Labs  Lab 06/15/22 0842  WBC 5.2  HGB 12.9  HCT 38.1  PLT 209  MCV 98.2  MCH 33.2  MCHC 33.9  RDW 14.6   ------------------------------------------------------------------------------------------------------------------  Chemistries  Recent Labs  Lab 06/15/22 0850  NA 137  K 4.1  CL 104  CO2 20*  GLUCOSE 78  BUN 12  CREATININE 0.73  CALCIUM 9.2  AST 42*  ALT 26  ALKPHOS 58  BILITOT 0.8   ------------------------------------------------------------------------------------------------------------------ estimated creatinine clearance is 52.2 mL/min (by C-G formula based on SCr of 0.73 mg/dL). ------------------------------------------------------------------------------------------------------------------ ------------------------------------------------------------------------------------------------------------------- Recent Labs    06/15/22 0842  DDIMER 0.75*   Urinalysis    Component Value Date/Time   BILIRUBINUR  NEG 04/15/2017 0932   PROTEINUR NEG 04/15/2017 0932   UROBILINOGEN 0.2 04/15/2017 0932   NITRITE NEG 04/15/2017 0932   LEUKOCYTESUR Negative 04/15/2017 0932     Imaging Results:    ECHOCARDIOGRAM COMPLETE  Result Date: 06/15/2022    ECHOCARDIOGRAM REPORT   Patient Name:   DALANIE KISNER Date of Exam: 06/15/2022 Medical Rec #:  [id number]          Height:       63.0 in Accession #:    [id number]         Weight:       104.1 lb Date of Birth:  1955-09-26          BSA:          1.465 m Patient Age:    66 years           BP:           151/85 mmHg Patient Gender: F                  HR:           62 bpm. [facility] Procedure: 2D Echo, Cardiac Doppler and Color Doppler Indications:    Chest Pain R07.9  History:        Patient has no prior history of Echocardiogram examinations.                 Signs/Symptoms:Chest Pain; Risk Factors:Hypertension, Current                 Smoker and Dyslipidemia.  Sonographer:    Aron Baba Referring Phys: [id number] Dorothe Pea BRANCH  Sonographer Comments: No parasternal window and no apical window. Image acquisition challenging due to patient body habitus and Image acquisition challenging due to respiratory motion. IMPRESSIONS  1. Left ventricular ejection fraction, by estimation, is 60 to 65%. The left ventricle has normal function. The left ventricle has no regional wall motion abnormalities. Left ventricular diastolic parameters are consistent with Grade I diastolic dysfunction (impaired relaxation).  2. Right ventricular systolic function is normal. The right ventricular  size is normal. Tricuspid regurgitation signal is inadequate for assessing PA pressure.  3. The mitral valve is normal in structure. No evidence of mitral valve regurgitation. No evidence of mitral stenosis.  4. The tricuspid valve is abnormal.  5. The aortic valve is tricuspid. Aortic valve regurgitation is not visualized. No aortic stenosis is present.  6. The inferior vena cava is normal  in size with greater than 50% respiratory variability, suggesting right atrial pressure of 3 mmHg. FINDINGS  Left Ventricle: Left ventricular ejection fraction, by estimation, is 60 to 65%. The left ventricle has normal function. The left ventricle has no regional wall motion abnormalities. The left ventricular internal cavity size was normal in size. There is  no left ventricular hypertrophy. Left ventricular diastolic parameters are consistent with Grade I diastolic dysfunction (impaired relaxation). Normal left ventricular filling pressure. Right Ventricle: The right ventricular size is normal. Right vetricular wall thickness was not well visualized. Right ventricular systolic function is normal. Tricuspid regurgitation signal is inadequate for assessing PA pressure. Left Atrium: Left atrial size was normal in size. Right Atrium: Right atrial size was normal in size. Pericardium: There is no evidence of pericardial effusion. Mitral Valve: The mitral valve is normal in structure. No evidence of mitral valve regurgitation. No evidence of mitral valve stenosis. Tricuspid Valve: The tricuspid valve is abnormal. Tricuspid valve regurgitation is mild . No evidence of tricuspid stenosis. Aortic Valve: The aortic valve is tricuspid. Aortic valve regurgitation is not visualized. No aortic stenosis is present. Aortic valve mean gradient measures 3.7 mmHg. Aortic valve peak gradient measures 8.3 mmHg. Aortic valve area, by VTI measures 1.32 cm. Pulmonic Valve: The pulmonic valve was not well visualized. Pulmonic valve regurgitation is not visualized. No evidence of pulmonic stenosis. Aorta: The aortic root was not well visualized. Venous: The inferior vena cava is normal in size with greater than 50% respiratory variability, suggesting right atrial pressure of 3 mmHg. IAS/Shunts: No atrial level shunt detected by color flow Doppler.  LEFT VENTRICLE PLAX 2D LVIDd:         3.90 cm   Diastology LVIDs:         2.40 cm   LV e'  medial:    9.03 cm/s LV PW:         0.80 cm   LV E/e' medial:  7.2 LV IVS:        0.80 cm   LV e' lateral:   9.90 cm/s LVOT diam:     1.50 cm   LV E/e' lateral: 6.5 LV SV:         42 LV SV Index:   29 LVOT Area:     1.77 cm  LEFT ATRIUM             Index        RIGHT ATRIUM           Index LA diam:        3.50 cm 2.39 cm/m   RA Area:     14.40 cm LA Vol (A2C):   28.8 ml 19.66 ml/m  RA Volume:   37.70 ml  25.74 ml/m LA Vol (A4C):   48.4 ml 33.04 ml/m LA Biplane Vol: 39.1 ml 26.69 ml/m  AORTIC VALVE AV Area (Vmax):    1.01 cm AV Area (Vmean):   1.07 cm AV Area (VTI):     1.32 cm AV Vmax:           144.47 cm/s AV Vmean:  90.207 cm/s AV VTI:            0.318 m AV Peak Grad:      8.3 mmHg AV Mean Grad:      3.7 mmHg LVOT Vmax:         82.30 cm/s LVOT Vmean:        54.400 cm/s LVOT VTI:          0.237 m LVOT/AV VTI ratio: 0.75  AORTA Ao Root diam: 3.80 cm MITRAL VALVE MV Area (PHT): 3.37 cm    SHUNTS MV Decel Time: 225 msec    Systemic VTI:  0.24 m MR Peak grad: 4.4 mmHg     Systemic Diam: 1.50 cm MR Vmax:      105.00 cm/s MV E velocity: 64.70 cm/s MV A velocity: 74.10 cm/s MV E/A ratio:  0.87 Dina Rich MD Electronically signed by Dina Rich MD Signature Date/Time: 06/15/2022/4:04:54 PM    Final    CT Angio Chest PE W/Cm &/Or Wo Cm  Result Date: 06/15/2022 CLINICAL DATA:  Chest pain. Left-sided chest tightness since this morning. Positive D-dimer EXAM: CT ANGIOGRAPHY CHEST WITH CONTRAST TECHNIQUE: Multidetector CT imaging of the chest was performed using the standard protocol during bolus administration of intravenous contrast. Multiplanar CT image reconstructions and MIPs were obtained to evaluate the vascular anatomy. RADIATION DOSE REDUCTION: This exam was performed according to the departmental dose-optimization program which includes automated exposure control, adjustment of the mA and/or kV according to patient size and/or use of iterative reconstruction technique. CONTRAST:  75mL  OMNIPAQUE IOHEXOL 350 MG/ML SOLN COMPARISON:  Chest x-ray earlier 06/15/2022. FINDINGS: Cardiovascular: No pulmonary embolism identified. No pericardial effusion. Heart has a normal course and caliber. Coronary artery calcifications are seen. Please correlate for other coronary risk factors. There are some scattered vascular calcifications and noncalcified plaque along the normal caliber thoracic aorta. Mediastinum/Nodes: Slightly patulous thoracic esophagus. No specific abnormal lymph node enlargement identified in the axillary region, hilum or mediastinum. Preserved thyroid gland. Lungs/Pleura: There is some linear opacity lung bases likely scar or atelectasis. No consolidation, pneumothorax or effusion. Tiny nodule seen in the posterior right upper lobe measuring 3 mm on series 6, image 42. This may be cavitary. Similar 3 mm nodule in the right upper lobe in the midportion on image 37 of series 6 as well Upper Abdomen: The adrenal glands are preserved. There is a large cystic focus involving the left kidney. 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 Musculoskeletal: Curvature and degenerative changes along the spine. Review of the MIP images confirms the above findings. IMPRESSION: No pulmonary embolism identified. Two tiny right-sided upper lobe lung nodules. One of which is cavitary. Non-contrast chest CT at 3-6 months is recommended. If nodules persist and are stable at that time, consider additional non-contrast chest CT examinations at 2 and 4 years. This recommendation follows the consensus statement: Guidelines for Management of Incidental Pulmonary Nodules Detected on CT Images: From the Fleischner Society 2017; Radiology 2017; 284:228-243. Electronically Signed   By: Karen Kays M.D.   On: 06/15/2022 10:24   DG Chest Port 1 View  Result Date: 06/15/2022 CLINICAL DATA:  Chest pain. EXAM: PORTABLE CHEST 1 VIEW COMPARISON:   Jun 30, 2018. FINDINGS: The heart size and mediastinal contours are within normal limits. Nodular density seen in left lower lobe which may represent overlying nipple shadow. Lobular density is noted in right lower  lobe which also may represent overlying nipple shadow. No consolidative process is noted. The visualized skeletal structures are unremarkable. IMPRESSION: Densities are seen projected over both lower lobes which may represent overlying nipple shadow, but pulmonary nodules or neoplasm cannot be excluded. Repeat radiograph with nipple markers is recommended. Electronically Signed   By: Lupita Raider M.D.   On: 06/15/2022 09:13    Radiological Exams on Admission: ECHOCARDIOGRAM COMPLETE  Result Date: 06/15/2022    ECHOCARDIOGRAM REPORT   Patient Name:   MCKENSIE SCOTTI Date of Exam: 06/15/2022 Medical Rec #:  [id number]          Height:       63.0 in Accession #:    [id number]         Weight:       104.1 lb Date of Birth:  1955-08-28          BSA:          1.465 m Patient Age:    66 years           BP:           151/85 mmHg Patient Gender: F                  HR:           62 bpm. [facility] Procedure: 2D Echo, Cardiac Doppler and Color Doppler Indications:    Chest Pain R07.9  History:        Patient has no prior history of Echocardiogram examinations.                 Signs/Symptoms:Chest Pain; Risk Factors:Hypertension, Current                 Smoker and Dyslipidemia.  Sonographer:    Aron Baba Referring Phys: [id number] Dorothe Pea BRANCH  Sonographer Comments: No parasternal window and no apical window. Image acquisition challenging due to patient body habitus and Image acquisition challenging due to respiratory motion. IMPRESSIONS  1. Left ventricular ejection fraction, by estimation, is 60 to 65%. The left ventricle has normal function. The left ventricle has no regional wall motion abnormalities. Left ventricular diastolic parameters are consistent with Grade I diastolic  dysfunction (impaired relaxation).  2. Right ventricular systolic function is normal. The right ventricular size is normal. Tricuspid regurgitation signal is inadequate for assessing PA pressure.  3. The mitral valve is normal in structure. No evidence of mitral valve regurgitation. No evidence of mitral stenosis.  4. The tricuspid valve is abnormal.  5. The aortic valve is tricuspid. Aortic valve regurgitation is not visualized. No aortic stenosis is present.  6. The inferior vena cava is normal in size with greater than 50% respiratory variability, suggesting right atrial pressure of 3 mmHg. FINDINGS  Left Ventricle: Left ventricular ejection fraction, by estimation, is 60 to 65%. The left ventricle has normal function. The left ventricle has no regional wall motion abnormalities. The left ventricular internal cavity size was normal in size. There is  no left ventricular hypertrophy. Left ventricular diastolic parameters are consistent with Grade I diastolic dysfunction (impaired relaxation). Normal left ventricular filling pressure. Right Ventricle: The right ventricular size is normal. Right vetricular wall thickness was not well visualized. Right ventricular systolic function is normal. Tricuspid regurgitation signal is inadequate for assessing PA pressure. Left Atrium: Left atrial size was normal in size. Right Atrium: Right atrial size was normal in size. Pericardium: There is no evidence of pericardial effusion.  Mitral Valve: The mitral valve is normal in structure. No evidence of mitral valve regurgitation. No evidence of mitral valve stenosis. Tricuspid Valve: The tricuspid valve is abnormal. Tricuspid valve regurgitation is mild . No evidence of tricuspid stenosis. Aortic Valve: The aortic valve is tricuspid. Aortic valve regurgitation is not visualized. No aortic stenosis is present. Aortic valve mean gradient measures 3.7 mmHg. Aortic valve peak gradient measures 8.3 mmHg. Aortic valve area, by VTI  measures 1.32 cm. Pulmonic Valve: The pulmonic valve was not well visualized. Pulmonic valve regurgitation is not visualized. No evidence of pulmonic stenosis. Aorta: The aortic root was not well visualized. Venous: The inferior vena cava is normal in size with greater than 50% respiratory variability, suggesting right atrial pressure of 3 mmHg. IAS/Shunts: No atrial level shunt detected by color flow Doppler.  LEFT VENTRICLE PLAX 2D LVIDd:         3.90 cm   Diastology LVIDs:         2.40 cm   LV e' medial:    9.03 cm/s LV PW:         0.80 cm   LV E/e' medial:  7.2 LV IVS:        0.80 cm   LV e' lateral:   9.90 cm/s LVOT diam:     1.50 cm   LV E/e' lateral: 6.5 LV SV:         42 LV SV Index:   29 LVOT Area:     1.77 cm  LEFT ATRIUM             Index        RIGHT ATRIUM           Index LA diam:        3.50 cm 2.39 cm/m   RA Area:     14.40 cm LA Vol (A2C):   28.8 ml 19.66 ml/m  RA Volume:   37.70 ml  25.74 ml/m LA Vol (A4C):   48.4 ml 33.04 ml/m LA Biplane Vol: 39.1 ml 26.69 ml/m  AORTIC VALVE AV Area (Vmax):    1.01 cm AV Area (Vmean):   1.07 cm AV Area (VTI):     1.32 cm AV Vmax:           144.47 cm/s AV Vmean:          90.207 cm/s AV VTI:            0.318 m AV Peak Grad:      8.3 mmHg AV Mean Grad:      3.7 mmHg LVOT Vmax:         82.30 cm/s LVOT Vmean:        54.400 cm/s LVOT VTI:          0.237 m LVOT/AV VTI ratio: 0.75  AORTA Ao Root diam: 3.80 cm MITRAL VALVE MV Area (PHT): 3.37 cm    SHUNTS MV Decel Time: 225 msec    Systemic VTI:  0.24 m MR Peak grad: 4.4 mmHg     Systemic Diam: 1.50 cm MR Vmax:      105.00 cm/s MV E velocity: 64.70 cm/s MV A velocity: 74.10 cm/s MV E/A ratio:  0.87 Dina Rich MD Electronically signed by Dina Rich MD Signature Date/Time: 06/15/2022/4:04:54 PM    Final    CT Angio Chest PE W/Cm &/Or Wo Cm  Result Date: 06/15/2022 CLINICAL DATA:  Chest pain. Left-sided chest tightness since this morning. Positive D-dimer EXAM: CT ANGIOGRAPHY CHEST WITH CONTRAST  TECHNIQUE: Multidetector CT imaging of the chest was performed using the standard protocol during bolus administration of intravenous contrast. Multiplanar CT image reconstructions and MIPs were obtained to evaluate the vascular anatomy. RADIATION DOSE REDUCTION: This exam was performed according to the departmental dose-optimization program which includes automated exposure control, adjustment of the mA and/or kV according to patient size and/or use of iterative reconstruction technique. CONTRAST:  75mL OMNIPAQUE IOHEXOL 350 MG/ML SOLN COMPARISON:  Chest x-ray earlier 06/15/2022. FINDINGS: Cardiovascular: No pulmonary embolism identified. No pericardial effusion. Heart has a normal course and caliber. Coronary artery calcifications are seen. Please correlate for other coronary risk factors. There are some scattered vascular calcifications and noncalcified plaque along the normal caliber thoracic aorta. Mediastinum/Nodes: Slightly patulous thoracic esophagus. No specific abnormal lymph node enlargement identified in the axillary region, hilum or mediastinum. Preserved thyroid gland. Lungs/Pleura: There is some linear opacity lung bases likely scar or atelectasis. No consolidation, pneumothorax or effusion. Tiny nodule seen in the posterior right upper lobe measuring 3 mm on series 6, image 42. This may be cavitary. Similar 3 mm nodule in the right upper lobe in the midportion on image 37 of series 6 as well Upper Abdomen: The adrenal glands are preserved. There is a large cystic focus involving the left kidney. 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 Musculoskeletal: Curvature and degenerative changes along the spine. Review of the MIP images confirms the above findings. IMPRESSION: No pulmonary embolism identified. Two tiny right-sided upper lobe lung nodules. One of which is cavitary. Non-contrast chest CT at 3-6 months is  recommended. If nodules persist and are stable at that time, consider additional non-contrast chest CT examinations at 2 and 4 years. This recommendation follows the consensus statement: Guidelines for Management of Incidental Pulmonary Nodules Detected on CT Images: From the Fleischner Society 2017; Radiology 2017; 284:228-243. Electronically Signed   By: Karen Kays M.D.   On: 06/15/2022 10:24   DG Chest Port 1 View  Result Date: 06/15/2022 CLINICAL DATA:  Chest pain. EXAM: PORTABLE CHEST 1 VIEW COMPARISON:  Jun 30, 2018. FINDINGS: The heart size and mediastinal contours are within normal limits. Nodular density seen in left lower lobe which may represent overlying nipple shadow. Lobular density is noted in right lower lobe which also may represent overlying nipple shadow. No consolidative process is noted. The visualized skeletal structures are unremarkable. IMPRESSION: Densities are seen projected over both lower lobes which may represent overlying nipple shadow, but pulmonary nodules or neoplasm cannot be excluded. Repeat radiograph with nipple markers is recommended. Electronically Signed   By: Lupita Raider M.D.   On: 06/15/2022 09:13    DVT Prophylaxis -SCD/Heparin AM Labs Ordered, also please review Full Orders  Family Communication: Admission, patients condition and plan of care including tests being ordered have been discussed with the patient  who indicate understanding and agree with the plan   Condition  -stable  Shon Hale M.D on 06/15/2022 at 7:39 PM Go to [email or web address] -  for contact info  [facility] - Office  [phone]

## 2022-06-15 NOTE — ED Triage Notes (Signed)
Pt stated she woke around 4am to a left sided chest tightness and reports shaking. Pt reports pain radiates to her back. She reports nausea, cough and shortness of breath.

## 2022-06-15 NOTE — Consult Note (Signed)
Cardiology Consultation   Patient ID: Tammy Proctor MRN: [id number]; DOB: September 06, 1955  Admit date: 06/15/2022 Date of Consult: 06/15/2022  PCP:  System, Provider Not In   [facility] Providers Cardiologist:  New}     Patient Profile:   Tammy Proctor is a 67 y.o. female with a hx of HTN, HLD, abdominal mass  who is being seen 06/15/2022 for the evaluation of chest pain at the request of Dr Charm Barges.  History of Present Illness:   Ms. 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  Reports sudden onset of chest pain around 4AM awoke her from sleep. Feeling of heart racing, dull left sided pain 9/10, SOB, felt cold, nauseous, started coughing. Went to [facility] and called 911. Reports pain initially was tender to palpation, worst with deep breathing. Pain has been present constant 8.5 hrs at time of my interview, though decreased from 9/10 to 5/10 in severity after morphine though was sleeping comfortably when I entered room.    WBC 5.2 Hgb 12.9 Plt 209  Ddimer 0.75 K 4.1 Cr 0.73 BUN 12  Trop 7-->8 CT PE no PE. EKG SR, diffuse peaked Twaves.     Past Medical History:  Diagnosis Date   Anxiety    HLD (hyperlipidemia)    Hypertension    Migraine    resolved    Past Surgical History:  Procedure Laterality Date   BIOPSY  01/25/2020   Procedure: BIOPSY;  Surgeon: Lanelle Bal, DO;  [facility]: [facility];  Service: Endoscopy;;   BREAST EXCISIONAL BIOPSY Right    benign   CATARACT EXTRACTION Right    COLONOSCOPY WITH PROPOFOL N/A 01/25/2020   Procedure: COLONOSCOPY WITH PROPOFOL;  Surgeon: Lanelle Bal, DO;  Location: [facility];  Service: Endoscopy;  Laterality: N/A;  9:00am   COLONOSCOPY WITH PROPOFOL N/A 03/03/2020   Procedure: COLONOSCOPY WITH PROPOFOL;  Surgeon: Lanelle Bal, DO;  Location: [facility];  Service: Endoscopy;  Laterality: N/A;  10:15am   NASAL SINUS SURGERY     POLYPECTOMY  03/03/2020   Procedure: POLYPECTOMY;  Surgeon: Lanelle Bal, DO;  Location: [facility];  Service: Endoscopy;;   TUBAL LIGATION        Inpatient Medications: Scheduled Meds:  Continuous Infusions:  PRN Meds: nitroGLYCERIN  Allergies:    Allergies  Allergen Reactions   Ceclor [Cefaclor] Hives    Social History:   Social History   Socioeconomic History   Marital status: Divorced    Spouse name: Not on file   Number of children: 3   Years of education: Not on file   Highest education level: Associate degree: academic program  Occupational History   Not on file  Tobacco Use   Smoking status: Every Day    Packs/day: 0.25    Years: 52.00    Additional pack years: 0.00    Total pack years: 13.00    Types: Cigarettes    Start date: 08/12/1974   Smokeless tobacco: Never  Vaping Use   Vaping Use: Never used  Substance and Sexual Activity   Alcohol use: Yes    Comment: occasional   Drug use: Yes    Types: Marijuana    Comment: once per month   Sexual activity: Not Currently  Other Topics Concern   Not on file  Social  History Narrative   Not on file   Social Determinants of Health   Financial Resource Strain: Not on file  Food Insecurity: Not on file  Transportation Needs: Unmet Transportation Needs (04/15/2018)   PRAPARE - Administrator, Civil Service (Medical): Yes    Lack of Transportation (Non-Medical): Yes  Physical Activity: Not on file  Stress: Not on file  Social Connections: Not on file  Intimate Partner Violence: Not on file    Family History:    Family History  Problem Relation Age of Onset   Cancer Mother        breast   Diabetes Mother    Heart disease Father    Mental illness Father    Heart disease Sister    Diabetes Brother    Diabetes Sister    Colon cancer Neg Hx      ROS:  Please see  the history of present illness.   All other ROS reviewed and negative.     Physical Exam/Data:   Vitals:   06/15/22 0840 06/15/22 0843  BP: (!) 169/88   Pulse: 76   Resp: 12   Temp: 98.7 F (37.1 C)   TempSrc: Oral   Weight:  48.1 kg  Height:   (1.6 m)   No intake or output data in the 24 hours ending 06/15/22 1207    06/15/2022    8:43 AM 04/02/2022    9:58 AM 03/03/2020    9:39 AM  Last 3 Weights  Weight (lbs) 106 lb 119 lb 14.9 oz 120 lb  Weight (kg) 48.081 kg 54.4 kg 54.432 kg     Body mass index is 18.78 kg/m.  General:  Well nourished, well developed, in no acute distress HEENT: normal Neck: no JVD Vascular: No carotid bruits; Distal pulses 2+ bilaterally Cardiac:  normal S1, S2; RRR; no murmur  Lungs:  clear to auscultation bilaterally, no wheezing, rhonchi or rales  Abd: soft, nontender, no hepatomegaly  Ext: no edema Musculoskeletal:  No deformities, BUE and BLE strength normal and equal Skin: warm and dry  Neuro:  CNs 2-12 intact, no focal abnormalities noted Psych:  Normal affect     Laboratory Data:  High Sensitivity Troponin:   Recent Labs  Lab 06/15/22 0842 06/15/22 1037  TROPONINIHS 7 8     Chemistry Recent Labs  Lab 06/15/22 0850  NA 137  K 4.1  CL 104  CO2 20*  GLUCOSE 78  BUN 12  CREATININE 0.73  CALCIUM 9.2  GFRNONAA >60  ANIONGAP 13    Recent Labs  Lab 06/15/22 0850  PROT 7.1  ALBUMIN 4.3  AST 42*  ALT 26  ALKPHOS 58  BILITOT 0.8   Lipids No results for input(s): "CHOL", "TRIG", "HDL", "LABVLDL", "LDLCALC", "CHOLHDL" in the last 168 hours.  Hematology Recent Labs  Lab 06/15/22 0842  WBC 5.2  RBC 3.88  HGB 12.9  HCT 38.1  MCV 98.2  MCH 33.2  MCHC 33.9  RDW 14.6  PLT 209   Thyroid No results for input(s): "TSH", "FREET4" in the last 168 hours.  BNPNo results for input(s): "BNP", "PROBNP" in the last 168 hours.  DDimer  Recent Labs  Lab 06/15/22 [phone]  DDIMER 0.75*     Radiology/Studies:  CT Angio  Chest PE W/Cm &/Or Wo Cm  Result Date: 06/15/2022 CLINICAL DATA:  Chest pain. Left-sided chest tightness since this morning. Positive D-dimer EXAM: CT ANGIOGRAPHY CHEST WITH CONTRAST TECHNIQUE: Multidetector CT imaging of the chest  was performed using the standard protocol during bolus administration of intravenous contrast. Multiplanar CT image reconstructions and MIPs were obtained to evaluate the vascular anatomy. RADIATION DOSE REDUCTION: This exam was performed according to the departmental dose-optimization program which includes automated exposure control, adjustment of the mA and/or kV according to patient size and/or use of iterative reconstruction technique. CONTRAST:  75mL OMNIPAQUE IOHEXOL 350 MG/ML SOLN COMPARISON:  Chest x-ray earlier 06/15/2022. FINDINGS: Cardiovascular: No pulmonary embolism identified. No pericardial effusion. Heart has a normal course and caliber. Coronary artery calcifications are seen. Please correlate for other coronary risk factors. There are some scattered vascular calcifications and noncalcified plaque along the normal caliber thoracic aorta. Mediastinum/Nodes: Slightly patulous thoracic esophagus. No specific abnormal lymph node enlargement identified in the axillary region, hilum or mediastinum. Preserved thyroid gland. Lungs/Pleura: There is some linear opacity lung bases likely scar or atelectasis. No consolidation, pneumothorax or effusion. Tiny nodule seen in the posterior right upper lobe measuring 3 mm on series 6, image 42. This may be cavitary. Similar 3 mm nodule in the right upper lobe in the midportion on image 37 of series 6 as well Upper Abdomen: The adrenal glands are preserved. There is a large cystic focus involving the left kidney. 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 Musculoskeletal: Curvature and degenerative changes along the spine. Review of the MIP images confirms  the above findings. IMPRESSION: No pulmonary embolism identified. Two tiny right-sided upper lobe lung nodules. One of which is cavitary. Non-contrast chest CT at 3-6 months is recommended. If nodules persist and are stable at that time, consider additional non-contrast chest CT examinations at 2 and 4 years. This recommendation follows the consensus statement: Guidelines for Management of Incidental Pulmonary Nodules Detected on CT Images: From the Fleischner Society 2017; Radiology 2017; 284:228-243. Electronically Signed   By: Karen Kays M.D.   On: 06/15/2022 10:24   DG Chest Port 1 View  Result Date: 06/15/2022 CLINICAL DATA:  Chest pain. EXAM: PORTABLE CHEST 1 VIEW COMPARISON:  Jun 30, 2018. FINDINGS: The heart size and mediastinal contours are within normal limits. Nodular density seen in left lower lobe which may represent overlying nipple shadow. Lobular density is noted in right lower lobe which also may represent overlying nipple shadow. No consolidative process is noted. The visualized skeletal structures are unremarkable. IMPRESSION: Densities are seen projected over both lower lobes which may represent overlying nipple shadow, but pulmonary nodules or neoplasm cannot be excluded. Repeat radiograph with nipple markers is recommended. Electronically Signed   By: Lupita Raider M.D.   On: 06/15/2022 09:13     Assessment and Plan:   1.Chest pain - atypical symptoms. Ongoing 8.5 hours constant at time of my evaluation, initially tender to palpation at onset and also worst with deep breathing - CT PE negative - trops neg x 2. EKG SR, diffuse prominent Twaves - echo pending  - follow EKG, trops, echo - if negative workup would plan for outpatient stress testing. If significant abnormalities would require inpatient testing either at [facility] on Monday or transfer to [facility] over the weekend.   - any considerations for cath  would have to consider her colonic mass that she never followed  up on from 2021   2. 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  For questions or updates, please contact [facility] Please consult [email or web address] for contact info under    Signed, Dina Rich, MD  06/15/2022 12:07 PM

## 2022-06-16 DIAGNOSIS — K6389 Other specified diseases of intestine: Secondary | ICD-10-CM | POA: Diagnosis not present

## 2022-06-16 DIAGNOSIS — I10 Essential (primary) hypertension: Secondary | ICD-10-CM | POA: Diagnosis not present

## 2022-06-16 DIAGNOSIS — Z720 Tobacco use: Secondary | ICD-10-CM | POA: Diagnosis not present

## 2022-06-16 DIAGNOSIS — R911 Solitary pulmonary nodule: Secondary | ICD-10-CM | POA: Diagnosis not present

## 2022-06-16 DIAGNOSIS — R079 Chest pain, unspecified: Secondary | ICD-10-CM | POA: Diagnosis not present

## 2022-06-16 DIAGNOSIS — R0789 Other chest pain: Secondary | ICD-10-CM | POA: Diagnosis not present

## 2022-06-16 DIAGNOSIS — K633 Ulcer of intestine: Secondary | ICD-10-CM | POA: Diagnosis not present

## 2022-06-16 LAB — HIV ANTIBODY (ROUTINE TESTING W REFLEX): HIV Screen 4th Generation wRfx: NONREACTIVE

## 2022-06-16 LAB — TROPONIN I (HIGH SENSITIVITY)
Troponin I (High Sensitivity): 5 ng/L (ref <18)
Troponin I (High Sensitivity): 4 ng/L (ref <18)

## 2022-06-16 LAB — LDL CHOLESTEROL, DIRECT: Direct LDL: 54 mg/dL (ref 0–99)

## 2022-06-16 MED ORDER — ATORVASTATIN CALCIUM 40 MG PO TABS: 40.0000 mg | ORAL_TABLET | Freq: Every day | ORAL | 5 refills | Status: DC

## 2022-06-16 MED ORDER — ACETAMINOPHEN 325 MG PO TABS: 650.0000 mg | ORAL_TABLET | Freq: Four times a day (QID) | ORAL | 1 refills | Status: AC | PRN

## 2022-06-16 MED ORDER — AMLODIPINE BESYLATE 10 MG PO TABS: 10.0000 mg | ORAL_TABLET | Freq: Every day | ORAL | 5 refills | Status: DC

## 2022-06-16 MED ORDER — NICOTINE 21 MG/24HR TD PT24: 21.0000 mg | MEDICATED_PATCH | Freq: Every day | TRANSDERMAL | 0 refills | Status: DC

## 2022-06-16 MED ORDER — ASPIRIN 81 MG PO TBEC: 81.0000 mg | DELAYED_RELEASE_TABLET | Freq: Every day | ORAL | 11 refills | Status: DC

## 2022-06-16 MED ORDER — SERTRALINE HCL 50 MG PO TABS: 50.0000 mg | ORAL_TABLET | Freq: Every day | ORAL | 11 refills | Status: DC

## 2022-06-16 MED ORDER — TRAZODONE HCL 50 MG PO TABS: 50.0000 mg | ORAL_TABLET | Freq: Every day | ORAL | 5 refills | Status: DC

## 2022-06-16 NOTE — Discharge Summary (Signed)
Tammy Proctor, is a 67 y.o. female  DOB 01/18/1956  MRN [id number].  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 Lovell Sheehan with general surgeon to have this tumor removed as it could be cancer 3) follow-up with cardiologist Dr. 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   Essential hypertension, benign   Tobacco abuse   Hyperlipidemia      Past Medical History:  Diagnosis Date   Anxiety    HLD (hyperlipidemia)    Hypertension    Migraine    resolved    Past Surgical History:  Procedure Laterality Date   BIOPSY  01/25/2020   Procedure: BIOPSY;  Surgeon: Lanelle Bal, DO;  [facility];  Service: Endoscopy;;   BREAST EXCISIONAL BIOPSY Right    benign   CATARACT EXTRACTION Right    COLONOSCOPY WITH PROPOFOL N/A 01/25/2020   Procedure: COLONOSCOPY WITH PROPOFOL;  Surgeon: Lanelle Bal, DO;  [facility];  Service: Endoscopy;  Laterality: N/A;  9:00am   COLONOSCOPY WITH PROPOFOL N/A 03/03/2020   Procedure: COLONOSCOPY WITH  PROPOFOL;  Surgeon: Lanelle Bal, DO;  [facility];  Service: Endoscopy;  Laterality: N/A;  10:15am   NASAL SINUS SURGERY     POLYPECTOMY  03/03/2020   Procedure: POLYPECTOMY;  Surgeon: Lanelle Bal, DO;  [facility];  Service: Endoscopy;;   TUBAL LIGATION         HPI  from the history and physical done on the day of admission:    Tammy Proctor  is a 67 y.o. female with past medical history relevant for tobacco abuse, HLD, HTN and transverse colon mass presents with left-sided chest pain since 4 AM on 06/15/2022 -Chest pain radiates to her back associated with nausea cough and dyspnea -She does have chronic cough,  and dyspnea due to heavy smoking/COPD -No fever  Or chills  -Chest pain was associated with nausea but no emesis , patient had palpitations, no dizziness  -No flank pain or urinary symptoms -Patient drinks a pot of coffee a day states that she is having coffee withdrawal headaches -EKG sinus rhythm with nonspecific T wave changes -CTA chest without  PE but patient does have 2 tiny right-sided upper lobe nodules 1 of which is cavitary -Total cholesterol 293, HDL greater than 135 = CBC WNL -Troponin 7, repeat troponin 8 UDS -with THC -AST 42, otherwise LFTs WNL, creatinine 0.73, potassium 4.1     Hospital Course:      1)Atypical Chest Pain-  Cardiovascular risk factors include tobacco abuse, age, status post menopause, HTN, dyslipidemia,   -Patient ruled out for ACS by cardiac enzymes and EKG --Cardiology consult appreciated --troponin and EKG without acute findings -Echocardiogram with preserved EF of 60 to 65% and grade 1 diastolic dysfunction -No regional wall motion abnormalities -Outpatient follow-up with cardiologist advised   2) ulcerated mass of the transverse colon--diagnosed initially in 2021, subsequent colonoscopy with pathology in 2020 to confirm it  -patient was advised to have partial colectomy with general surgery  patient never followed up -No obstructive symptoms at this time -Hgb WNL -No bleeding concerns at this time -Follow-up with Dr. Lovell Sheehan to general surgeon for partial colectomy strongly advised please see discharge instructions   3) COPD/tobacco abuse--- smoking cessation advised, nicotine patch as ordered -Bronchodilators as ordered   4)HTN--amlodipine 10 mg daily   5) headaches--- presumed caffeine withdrawal headaches -Improved overall -Symptomatic management as ordered   6) depression----restart Zoloft daily and give trazodone nightly   Dispo: The patient is from: Home              Anticipated d/c is to: Home  Discharge Condition: stable  Follow UP   Follow-up Information     Antoine Poche, MD. Schedule an appointment as soon as possible for a visit in 1 week(s).   Specialty: Cardiology Why: To discuss possible outpatient stress test due to episodes of chest pain Contact information: [address] [place] [address] [phone]         Franky Macho, MD. Schedule an appointment as soon as possible for a visit in 1 week(s).   Specialty: General Surgery Why: To discuss removal of tumor in your bowel Contact information: [address] [place] [address] [phone]                 Diet and Activity recommendation:  As advised  Discharge Instructions    Discharge Instructions     Call MD for:  difficulty breathing, headache or visual disturbances   Complete by: As directed    Call MD for:  persistant dizziness or light-headedness   Complete by: As directed    Call MD for:  persistant nausea and vomiting   Complete by: As directed    Call MD for:  temperature >100.4   Complete by: As directed    Diet - low sodium heart healthy   Complete by: As directed    Discharge instructions   Complete by: As directed    1) complete abstinence from tobacco advised 2) 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 Lovell Sheehan with general surgeon to have this tumor removed as it could be cancer 3) follow-up with cardiologist Dr. Wyline Mood or Randall An within the next week to discuss possibly having outpatient stress test due to your episodes of chest pains 4)Please note that there has been several changes to your medications   Increase activity slowly   Complete by: As directed  Discharge Medications     Allergies as of 06/16/2022       Reactions   Ceclor [cefaclor] Hives        Medication List     STOP taking these medications    oxyCODONE-acetaminophen 5-325 MG tablet Commonly known as: PERCOCET/ROXICET   promethazine-dextromethorphan 6.25-15 MG/5ML syrup Commonly known as: PROMETHAZINE-DM       TAKE these medications    acetaminophen 325 MG tablet Commonly known as: TYLENOL Take 2 tablets (650 mg total) by mouth every 6 (six) hours as needed for mild pain (or Fever >/= 101).   amLODipine 10 MG tablet Commonly known as: NORVASC Take 1 tablet (10 mg total) by mouth daily.   aspirin EC 81 MG tablet Take 1 tablet (81 mg total) by mouth daily with breakfast. Swallow whole. Start taking on: June 17, 2022   atorvastatin 40 MG tablet Commonly known as: LIPITOR Take 1 tablet (40 mg total) by mouth daily. What changed:  medication strength how much to take   fluticasone 50 MCG/ACT nasal spray Commonly known as: FLONASE Place 2 sprays into both nostrils daily.   nicotine 21 mg/24hr patch Commonly known as: NICODERM CQ - dosed in mg/24 hours Place 1 patch (21 mg total) onto the skin daily. Start taking on: June 17, 2022   sertraline 50 MG tablet Commonly known as: Zoloft Take 1 tablet (50 mg total) by mouth daily. What changed:  medication strength how much to take   traZODone 50 MG  tablet Commonly known as: DESYREL Take 1 tablet (50 mg total) by mouth at bedtime.        Major procedures and Radiology Reports - PLEASE review detailed and final reports for all details, in brief -   ECHOCARDIOGRAM COMPLETE  Result Date: 06/15/2022    ECHOCARDIOGRAM REPORT   Patient Name:   Tammy Proctor Date of Exam: 06/15/2022 Medical Rec #:  [id number]          Height:       63.0 in Accession #:    [id number]         Weight:       104.1 lb Date of Birth:  1955-09-10          BSA:          1.465 m Patient Age:    66 years           BP:           151/85 mmHg Patient Gender: F                  HR:           62 bpm. [facility] Procedure: 2D Echo, Cardiac Doppler and Color Doppler Indications:    Chest Pain R07.9  History:        Patient has no prior history of Echocardiogram examinations.                 Signs/Symptoms:Chest Pain; Risk Factors:Hypertension, Current                 Smoker and Dyslipidemia.  Sonographer:    Aron Baba Referring Phys: [id number] Dorothe Pea BRANCH  Sonographer Comments: No parasternal window and no apical window. Image acquisition challenging due to patient body habitus and Image acquisition challenging due to respiratory motion. IMPRESSIONS  1. Left ventricular ejection fraction, by estimation, is 60 to 65%. The left ventricle has normal function. The left ventricle has no regional  wall motion abnormalities. Left ventricular diastolic parameters are consistent with Grade I diastolic dysfunction (impaired relaxation).  2. Right ventricular systolic function is normal. The right ventricular size is normal. Tricuspid regurgitation signal is inadequate for assessing PA pressure.  3. The mitral valve is normal in structure. No evidence of mitral valve regurgitation. No evidence of mitral stenosis.  4. The tricuspid valve is abnormal.  5. The aortic valve is tricuspid. Aortic valve regurgitation is not visualized. No aortic stenosis is present.  6. The inferior  vena cava is normal in size with greater than 50% respiratory variability, suggesting right atrial pressure of 3 mmHg. FINDINGS  Left Ventricle: Left ventricular ejection fraction, by estimation, is 60 to 65%. The left ventricle has normal function. The left ventricle has no regional wall motion abnormalities. The left ventricular internal cavity size was normal in size. There is  no left ventricular hypertrophy. Left ventricular diastolic parameters are consistent with Grade I diastolic dysfunction (impaired relaxation). Normal left ventricular filling pressure. Right Ventricle: The right ventricular size is normal. Right vetricular wall thickness was not well visualized. Right ventricular systolic function is normal. Tricuspid regurgitation signal is inadequate for assessing PA pressure. Left Atrium: Left atrial size was normal in size. Right Atrium: Right atrial size was normal in size. Pericardium: There is no evidence of pericardial effusion. Mitral Valve: The mitral valve is normal in structure. No evidence of mitral valve regurgitation. No evidence of mitral valve stenosis. Tricuspid Valve: The tricuspid valve is abnormal. Tricuspid valve regurgitation is mild . No evidence of tricuspid stenosis. Aortic Valve: The aortic valve is tricuspid. Aortic valve regurgitation is not visualized. No aortic stenosis is present. Aortic valve mean gradient measures 3.7 mmHg. Aortic valve peak gradient measures 8.3 mmHg. Aortic valve area, by VTI measures 1.32 cm. Pulmonic Valve: The pulmonic valve was not well visualized. Pulmonic valve regurgitation is not visualized. No evidence of pulmonic stenosis. Aorta: The aortic root was not well visualized. Venous: The inferior vena cava is normal in size with greater than 50% respiratory variability, suggesting right atrial pressure of 3 mmHg. IAS/Shunts: No atrial level shunt detected by color flow Doppler.  LEFT VENTRICLE PLAX 2D LVIDd:         3.90 cm   Diastology LVIDs:          2.40 cm   LV e' medial:    9.03 cm/s LV PW:         0.80 cm   LV E/e' medial:  7.2 LV IVS:        0.80 cm   LV e' lateral:   9.90 cm/s LVOT diam:     1.50 cm   LV E/e' lateral: 6.5 LV SV:         42 LV SV Index:   29 LVOT Area:     1.77 cm  LEFT ATRIUM             Index        RIGHT ATRIUM           Index LA diam:        3.50 cm 2.39 cm/m   RA Area:     14.40 cm LA Vol (A2C):   28.8 ml 19.66 ml/m  RA Volume:   37.70 ml  25.74 ml/m LA Vol (A4C):   48.4 ml 33.04 ml/m LA Biplane Vol: 39.1 ml 26.69 ml/m  AORTIC VALVE AV Area (Vmax):    1.01 cm AV Area (Vmean):   1.07 cm AV  Area (VTI):     1.32 cm AV Vmax:           144.47 cm/s AV Vmean:          90.207 cm/s AV VTI:            0.318 m AV Peak Grad:      8.3 mmHg AV Mean Grad:      3.7 mmHg LVOT Vmax:         82.30 cm/s LVOT Vmean:        54.400 cm/s LVOT VTI:          0.237 m LVOT/AV VTI ratio: 0.75  AORTA Ao Root diam: 3.80 cm MITRAL VALVE MV Area (PHT): 3.37 cm    SHUNTS MV Decel Time: 225 msec    Systemic VTI:  0.24 m MR Peak grad: 4.4 mmHg     Systemic Diam: 1.50 cm MR Vmax:      105.00 cm/s MV E velocity: 64.70 cm/s MV A velocity: 74.10 cm/s MV E/A ratio:  0.87 Dina Rich MD Electronically signed by Dina Rich MD Signature Date/Time: 06/15/2022/4:04:54 PM    Final    CT Angio Chest PE W/Cm &/Or Wo Cm  Result Date: 06/15/2022 CLINICAL DATA:  Chest pain. Left-sided chest tightness since this morning. Positive D-dimer EXAM: CT ANGIOGRAPHY CHEST WITH CONTRAST TECHNIQUE: Multidetector CT imaging of the chest was performed using the standard protocol during bolus administration of intravenous contrast. Multiplanar CT image reconstructions and MIPs were obtained to evaluate the vascular anatomy. RADIATION DOSE REDUCTION: This exam was performed according to the departmental dose-optimization program which includes automated exposure control, adjustment of the mA and/or kV according to patient size and/or use of iterative reconstruction technique.  CONTRAST:  75mL OMNIPAQUE IOHEXOL 350 MG/ML SOLN COMPARISON:  Chest x-ray earlier 06/15/2022. FINDINGS: Cardiovascular: No pulmonary embolism identified. No pericardial effusion. Heart has a normal course and caliber. Coronary artery calcifications are seen. Please correlate for other coronary risk factors. There are some scattered vascular calcifications and noncalcified plaque along the normal caliber thoracic aorta. Mediastinum/Nodes: Slightly patulous thoracic esophagus. No specific abnormal lymph node enlargement identified in the axillary region, hilum or mediastinum. Preserved thyroid gland. Lungs/Pleura: There is some linear opacity lung bases likely scar or atelectasis. No consolidation, pneumothorax or effusion. Tiny nodule seen in the posterior right upper lobe measuring 3 mm on series 6, image 42. This may be cavitary. Similar 3 mm nodule in the right upper lobe in the midportion on image 37 of series 6 as well Upper Abdomen: The adrenal glands are preserved. There is a large cystic focus involving the left kidney. 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 Musculoskeletal: Curvature and degenerative changes along the spine. Review of the MIP images confirms the above findings. IMPRESSION: No pulmonary embolism identified. Two tiny right-sided upper lobe lung nodules. One of which is cavitary. Non-contrast chest CT at 3-6 months is recommended. If nodules persist and are stable at that time, consider additional non-contrast chest CT examinations at 2 and 4 years. This recommendation follows the consensus statement: Guidelines for Management of Incidental Pulmonary Nodules Detected on CT Images: From the Fleischner Society 2017; Radiology 2017; 284:228-243. Electronically Signed   By: Karen Kays M.D.   On: 06/15/2022 10:24   DG Chest Port 1 View  Result Date: 06/15/2022 CLINICAL DATA:  Chest pain. EXAM: PORTABLE CHEST 1  VIEW COMPARISON:  Jun 30, 2018. FINDINGS: The heart size and mediastinal contours are within normal limits. Nodular density seen in left lower lobe which may represent overlying nipple shadow. Lobular density is noted in right lower lobe which also may represent overlying nipple shadow. No consolidative process is noted. The visualized skeletal structures are unremarkable. IMPRESSION: Densities are seen projected over both lower lobes which may represent overlying nipple shadow, but pulmonary nodules or neoplasm cannot be excluded. Repeat radiograph with nipple markers is recommended. Electronically Signed   By: Lupita Raider M.D.   On: 06/15/2022 09:13     Today   Subjective    Tammy Proctor today has no chest pains -Ambulated without dyspnea on exertion dizziness or palpitations          Patient has been seen and examined prior to discharge   Objective   Blood pressure (!) 158/83, pulse 77, temperature 98.3 F (36.8 C), temperature source Oral, resp. rate 16, height 5\' 3"  (1.6 m), weight 47.2 kg, SpO2 99 %.   Intake/Output Summary (Last 24 hours) at 06/16/2022 1619 Last data filed at 06/15/2022 1700 Gross per 24 hour  Intake 120 ml  Output --  Net 120 ml    Exam Gen:- Awake Alert, no acute distress  HEENT:- [place].AT, No sclera icterus Neck-Supple Neck,No JVD,.  Lungs-  CTAB , good air movement bilaterally CV- S1, S2 normal, regular,  inconsistently reproducible chest wall discomfort  Abd-  +ve B.Sounds, Abd Soft, No tenderness,    Extremity/Skin:- No  edema,   good pulses Psych-affect is appropriate, oriented x3 Neuro-no new focal deficits, no tremors    Data Review   CBC w Diff:  Lab Results  Component Value Date   WBC 5.2 06/15/2022   HGB 12.9 06/15/2022   HCT 38.1 06/15/2022   PLT 209 06/15/2022   LYMPHOPCT 32 06/30/2018   MONOPCT 7 06/30/2018   EOSPCT 2 06/30/2018   BASOPCT 1 06/30/2018    CMP:  Lab Results  Component Value Date   NA 137 06/15/2022   K  4.1 06/15/2022   CL 104 06/15/2022   CO2 20 (L) 06/15/2022   BUN 12 06/15/2022   CREATININE 0.73 06/15/2022   CREATININE 0.94 05/30/2016   PROT 7.1 06/15/2022   ALBUMIN 4.3 06/15/2022   BILITOT 0.8 06/15/2022   ALKPHOS 58 06/15/2022   AST 42 (H) 06/15/2022   ALT 26 06/15/2022  .  Total Discharge time is about 33 minutes  Shon Hale M.D on 06/16/2022 at 4:19 PM  Go to [email or web address] -  for contact info  [facility] - Office  [phone]

## 2022-06-16 NOTE — TOC Initial Note (Signed)
Transition of Care [facility]) - Initial/Assessment Note    Patient Details  Name: Tammy Proctor MRN: [id number] Date of Birth: 1955-08-18  Transition of Care [facility]) CM/SW Contact:    Catalina Gravel, LCSW Phone Number: 06/16/2022, 2:05 PM  Clinical Narrative:                 .. Transition of Care [facility]) Screening Note   Patient Details  Name: Tammy Proctor Date of Birth: 01/08/1956   Transition of Care [facility]) CM/SW Contact:    Catalina Gravel, LCSW Phone Number: 06/16/2022, 2:05 PM    Transition of Care Department [facility]) has reviewed patient and no TOC needs have been identified at this time. We will continue to monitor patient advancement through interdisciplinary progression rounds. If new patient transition needs arise, please place a TOC consult.     Barriers to Discharge: No Barriers Identified   Patient Goals and CMS Choice            Expected Discharge Plan and Services         Expected Discharge Date: 06/16/22                                    Prior Living Arrangements/Services                       Activities of Daily Living Home Assistive Devices/Equipment: None ADL Screening (condition at time of admission) Patient's cognitive ability adequate to safely complete daily activities?: No Is the patient deaf or have difficulty hearing?: No Does the patient have difficulty seeing, even when wearing glasses/contacts?: No Does the patient have difficulty concentrating, remembering, or making decisions?: No Patient able to express need for assistance with ADLs?: Yes Does the patient have difficulty dressing or bathing?: No Independently performs ADLs?: Yes (appropriate for developmental age) Does the patient have difficulty walking or climbing stairs?: No Weakness of Legs: None Weakness of Arms/Hands: None  Permission Sought/Granted                  Emotional Assessment              Admission diagnosis:  Pulmonary  nodules [R91.8] Chest pain [R07.9] Patient Active Problem List   Diagnosis Date Noted   Chest pain 06/15/2022   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 [place] [address] [place] [address] Phone: [phone] Fax: [phone]  [facility] [place], [place] - [address] [place] [address] Phone: [phone] Fax: [phone]     Social Determinants of Health (SDOH) Social History: SDOH Screenings   Food Insecurity: No Food Insecurity (06/15/2022)  Housing: Low Risk  (06/15/2022)  Transportation Needs: No Transportation Needs (06/15/2022)  Utilities: At Risk (06/15/2022)  Tobacco Use: High Risk (06/15/2022)   SDOH Interventions:     Readmission Risk Interventions     No data  to display

## 2022-06-16 NOTE — Progress Notes (Signed)
Dr. Marisa Severin & this writer went to patient's bedside to discuss her follow up needs. Patient instructed to call Dr. Lovell Sheehan (surgery) in regards to mass in the colon. Patient expressed understanding and repeated instructions twice to both myself & Dr. Marisa Severin.

## 2022-06-16 NOTE — Discharge Instructions (Signed)
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 Lovell Sheehan with general surgeon to have this tumor removed as it could be cancer 3) follow-up with cardiologist Dr. Wyline Mood or Randall An within the next week to discuss possibly having outpatient stress test due to your episodes of chest pains 4)Please note that there has been several changes to your medications

## 2022-06-25 ENCOUNTER — Ambulatory Visit (INDEPENDENT_AMBULATORY_CARE_PROVIDER_SITE_OTHER): Payer: Medicare HMO | Admitting: Family Medicine

## 2022-06-25 ENCOUNTER — Encounter: Payer: Self-pay | Admitting: Family Medicine

## 2022-06-25 VITALS — BP 130/75 | HR 71 | Temp 98.0°F | Ht 63.0 in | Wt 109.1 lb

## 2022-06-25 DIAGNOSIS — E782 Mixed hyperlipidemia: Secondary | ICD-10-CM | POA: Diagnosis not present

## 2022-06-25 DIAGNOSIS — R0789 Other chest pain: Secondary | ICD-10-CM

## 2022-06-25 DIAGNOSIS — Z720 Tobacco use: Secondary | ICD-10-CM

## 2022-06-25 DIAGNOSIS — R918 Other nonspecific abnormal finding of lung field: Secondary | ICD-10-CM

## 2022-06-25 DIAGNOSIS — I10 Essential (primary) hypertension: Secondary | ICD-10-CM | POA: Diagnosis not present

## 2022-06-25 DIAGNOSIS — Z1159 Encounter for screening for other viral diseases: Secondary | ICD-10-CM

## 2022-06-25 DIAGNOSIS — K6389 Other specified diseases of intestine: Secondary | ICD-10-CM

## 2022-06-25 DIAGNOSIS — Z23 Encounter for immunization: Secondary | ICD-10-CM

## 2022-06-25 LAB — CBC WITH DIFFERENTIAL/PLATELET
Basophils Absolute: 0.1 10*3/uL (ref 0.0–0.2)
EOS (ABSOLUTE): 0.1 10*3/uL (ref 0.0–0.4)
Hematocrit: 37.8 % (ref 34.0–46.6)
Monocytes Absolute: 0.6 10*3/uL (ref 0.1–0.9)
RBC: 3.95 x10E6/uL (ref 3.77–5.28)

## 2022-06-25 LAB — CMP14+EGFR

## 2022-06-25 LAB — LIPID PANEL

## 2022-06-25 LAB — TSH

## 2022-06-25 MED ORDER — VARENICLINE TARTRATE 0.5 MG PO TABS
ORAL_TABLET | ORAL | 0 refills | Status: DC
Start: 2022-06-25 — End: 2022-09-06

## 2022-06-25 MED ORDER — VARENICLINE TARTRATE 1 MG PO TABS
1.0000 mg | ORAL_TABLET | Freq: Two times a day (BID) | ORAL | 0 refills | Status: DC
Start: 2022-07-02 — End: 2022-09-06

## 2022-06-25 NOTE — Progress Notes (Signed)
New Patient Office Visit  Subjective    Patient ID: Tammy Proctor, female    DOB: [birth year]  Age: 67 y.o. MRN: [id number]  CC:  Chief Complaint  Patient presents with   New Patient (Initial Visit)   Here with sister today.   HPI Tammy Proctor presents to establish care. She was recently admitted to [facility] for chest pain. She was discharged on 06/15/21. She was negative serial triponin and no acute findings on EKG. Her echo was essentially normal. She was discharged with amlodipng and lipitor with recommendations to follow up with cardiology outpatient for a stress test. She denies chest pain, shortness of breath, palpitations, edema, or orthopnea since discharge. She does feel tired but believes this is due to the zoloft and trazodone that was also just started for depression. She reports that she has been sleeping better. She denies SI, plan, or intent. She also was instructed to follow up with general surgery for a mass in her colon. She was originial referred in 2022 by GI following a colonoscopy, but was lost to follow up. While in the hospital 2 small RUL pulmonary nodules were noted on CTA with recommended CT follow up in 3-6 months. She denies productive cough, fever, wheezing, or shortness of breath. Nicotene patches were sent in for her but she is unable to afford them. She would like to try chantix instead.   She did have toast with jam and black coffee this morning.    Outpatient Encounter Medications as of 06/25/2022  Medication Sig   acetaminophen (TYLENOL) 325 MG tablet Take 2 tablets (650 mg total) by mouth every 6 (six) hours as needed for mild pain (or Fever >/= 101).   amLODipine (NORVASC) 10 MG tablet Take 1 tablet (10 mg total) by mouth daily.   aspirin EC 81 MG tablet Take 1 tablet (81 mg total) by mouth daily with breakfast. Swallow whole.   atorvastatin (LIPITOR) 40 MG tablet Take 1 tablet (40 mg total) by mouth daily.   fluticasone (FLONASE) 50 MCG/ACT nasal  spray Place 2 sprays into both nostrils daily.   nicotine (NICODERM CQ - DOSED IN MG/24 HOURS) 21 mg/24hr patch Place 1 patch (21 mg total) onto the skin daily.   sertraline (ZOLOFT) 50 MG tablet Take 1 tablet (50 mg total) by mouth daily.   traZODone (DESYREL) 50 MG tablet Take 1 tablet (50 mg total) by mouth at bedtime.   No facility-administered encounter medications on file as of 06/25/2022.    Past Medical History:  Diagnosis Date   Anxiety    Depression    HLD (hyperlipidemia)    Hypertension    Migraine    resolved    Past Surgical History:  Procedure Laterality Date   BIOPSY  01/25/2020   Procedure: BIOPSY;  Surgeon: Lanelle Bal, DO;  [facility];  Service: Endoscopy;;   BREAST EXCISIONAL BIOPSY Right    benign   CATARACT EXTRACTION Right    COLONOSCOPY WITH PROPOFOL N/A 01/25/2020   Procedure: COLONOSCOPY WITH PROPOFOL;  Surgeon: Lanelle Bal, DO;  [facility];  Service: Endoscopy;  Laterality: N/A;  9:00am   COLONOSCOPY WITH PROPOFOL N/A 03/03/2020   Procedure: COLONOSCOPY WITH PROPOFOL;  Surgeon: Lanelle Bal, DO;  [facility];  Service: Endoscopy;  Laterality: N/A;  10:15am   NASAL SINUS SURGERY     POLYPECTOMY  03/03/2020   Procedure: POLYPECTOMY;  Surgeon: Lanelle Bal, DO;  [facility];  Service: Endoscopy;;   TUBAL LIGATION      Family History  Problem Relation Age of Onset   Cancer Mother        breast   Diabetes Mother    Heart disease Father    Mental illness Father    Heart attack Father    Heart attack Sister    Heart disease Sister    Heart disease Sister    Diabetes Sister    Cancer Brother        prostate   Diabetes Brother    Colon cancer Neg Hx     Social History   Socioeconomic History   Marital status: Divorced    Spouse name: Not on file   Number of children: 3   Years of education: 14   Highest education level: Associate degree: academic program  Occupational  History   Not on file  Tobacco Use   Smoking status: Every Day    Packs/day: 0.50    Years: 52.00    Additional pack years: 0.00    Total pack years: 26.00    Types: Cigarettes    Start date: 08/12/1974   Smokeless tobacco: Never  Vaping Use   Vaping Use: Never used  Substance and Sexual Activity   Alcohol use: Yes    Alcohol/week: 6.0 standard drinks of alcohol    Types: 6 Cans of beer per week    Comment: occasional   Drug use: Yes    Types: Marijuana    Comment: once per month   Sexual activity: Yes    Birth control/protection: Surgical  Other Topics Concern   Not on file  Social History Narrative   Not on file   Social Determinants of Health   Financial Resource Strain: Not on file  Food Insecurity: No Food Insecurity (06/15/2022)   Hunger Vital Sign    Worried About Running Out of Food in the Last Year: Never true    Ran Out of Food in the Last Year: Never true  Transportation Needs: No Transportation Needs (06/15/2022)   PRAPARE - Administrator, Civil Service (Medical): No    Lack of Transportation (Non-Medical): No  Physical Activity: Not on file  Stress: Not on file  Social Connections: Not on file  Intimate Partner Violence: Not At Risk (06/15/2022)   Humiliation, Afraid, Rape, and Kick questionnaire    Fear of Current or Ex-Partner: No    Emotionally Abused: No    Physically Abused: No    Sexually Abused: No    ROS As per HPI.      Objective    BP 130/75   Pulse 71   Temp 98 F (36.7 C) (Temporal)   Ht 5\' 3"  (1.6 m)   Wt 109 lb 2 oz (49.5 kg)   SpO2 96%   BMI 19.33 kg/m   Physical Exam Vitals and nursing note reviewed.  Constitutional:      General: She is not in acute distress.    Appearance: Normal appearance. She is not ill-appearing, toxic-appearing or diaphoretic.  HENT:     Head: Normocephalic and atraumatic.     Mouth/Throat:     Mouth: Mucous membranes are moist.     Pharynx: Oropharynx is clear.  Eyes:      General: No scleral icterus.    Extraocular Movements: Extraocular movements intact.     Conjunctiva/sclera: Conjunctivae normal.     Pupils: Pupils are equal, round, and reactive to light.  Cardiovascular:  Rate and Rhythm: Normal rate.     Heart sounds: Normal heart sounds. No murmur heard. Pulmonary:     Effort: Pulmonary effort is normal. No respiratory distress.     Breath sounds: Normal breath sounds. No wheezing, rhonchi or rales.  Abdominal:     General: Bowel sounds are normal. There is no distension.     Palpations: Abdomen is soft.     Tenderness: There is no abdominal tenderness.  Musculoskeletal:     Cervical back: Neck supple. No rigidity.     Right lower leg: No edema.     Left lower leg: No edema.  Skin:    General: Skin is warm and dry.  Neurological:     General: No focal deficit present.     Mental Status: She is alert and oriented to person, place, and time.  Psychiatric:        Mood and Affect: Mood normal.        Behavior: Behavior normal.        Thought Content: Thought content normal.        Judgment: Judgment normal.         Assessment & Plan:   Zuleica was seen today for new patient (initial visit).  Diagnoses and all orders for this visit:  Primary hypertension Well controlled on current regimen. Labs pending. Continue amlodipine.  -     Ambulatory referral to Cardiology -     CBC with Differential/Platelet -     CMP14+EGFR -     TSH  Mixed hyperlipidemia Just started on lipitor. Labs pending.  -     Ambulatory referral to Cardiology -     Lipid panel  Atypical chest pain ACS ruled out in hospital. Referral to cardiology for further evaluation.  -     Ambulatory referral to Cardiology  Pulmonary nodules Incidental finding on CTA. Discussed need for repeat CT in 3-6 months.   Tobacco abuse Discussed risks on Chantix. Discontinue and seek help right away for SI. Discussed titration dosing and cessation in 1 week.  -      varenicline (CHANTIX) 0.5 MG tablet; Days 1 to 3: take 0.5 mg by mouth once daily. Days 4 to 7: 0.5 mg by mouth twice daily. -     varenicline (CHANTIX) 1 MG tablet; Take 1 tablet (1 mg total) by mouth 2 (two) times daily.  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  -     Ambulatory referral to General Surgery  Need for hepatitis C screening test -     Hepatitis C antibody  Need for vaccination -     Tdap vaccine greater than or equal to 7yo IM   Return in about 6 weeks (around 08/06/2022) for medication follow up.   The patient indicates understanding of these issues and agrees with the plan.  Gabriel Earing, FNP

## 2022-06-26 LAB — CMP14+EGFR
AST: 28 IU/L (ref 0–40)
Albumin: 4.6 g/dL (ref 3.9–4.9)
Alkaline Phosphatase: 74 IU/L (ref 44–121)
CO2: 20 mmol/L (ref 20–29)
Creatinine, Ser: 0.86 mg/dL (ref 0.57–1.00)
Sodium: 137 mmol/L (ref 134–144)

## 2022-06-26 LAB — HEPATITIS C ANTIBODY: Hep C Virus Ab: NONREACTIVE

## 2022-06-26 LAB — CBC WITH DIFFERENTIAL/PLATELET
Basos: 1 %
Eos: 1 %
Hemoglobin: 12.8 g/dL (ref 11.1–15.9)
Immature Grans (Abs): 0 10*3/uL (ref 0.0–0.1)
Immature Granulocytes: 0 %
Lymphocytes Absolute: 1.6 10*3/uL (ref 0.7–3.1)
Lymphs: 25 %
MCH: 32.4 pg (ref 26.6–33.0)
MCHC: 33.9 g/dL (ref 31.5–35.7)
MCV: 96 fL (ref 79–97)
Monocytes: 10 %
Neutrophils Absolute: 4 10*3/uL (ref 1.4–7.0)
Neutrophils: 63 %
Platelets: 272 10*3/uL (ref 150–450)
RDW: 12.3 % (ref 11.7–15.4)
WBC: 6.3 10*3/uL (ref 3.4–10.8)

## 2022-06-26 LAB — LIPID PANEL
Chol/HDL Ratio: 1.4 ratio (ref 0.0–4.4)
LDL Chol Calc (NIH): 51 mg/dL (ref 0–99)
Triglycerides: 65 mg/dL (ref 0–149)
VLDL Cholesterol Cal: 12 mg/dL (ref 5–40)

## 2022-06-27 ENCOUNTER — Other Ambulatory Visit: Payer: Self-pay | Admitting: Family Medicine

## 2022-07-06 ENCOUNTER — Ambulatory Visit: Payer: Medicare HMO | Attending: Nurse Practitioner | Admitting: Nurse Practitioner

## 2022-07-06 ENCOUNTER — Encounter: Payer: Self-pay | Admitting: Nurse Practitioner

## 2022-07-06 ENCOUNTER — Encounter: Payer: Self-pay | Admitting: *Deleted

## 2022-07-06 VITALS — BP 124/70 | HR 80 | Ht 63.0 in | Wt 111.2 lb

## 2022-07-06 DIAGNOSIS — R42 Dizziness and giddiness: Secondary | ICD-10-CM

## 2022-07-06 DIAGNOSIS — F1290 Cannabis use, unspecified, uncomplicated: Secondary | ICD-10-CM

## 2022-07-06 DIAGNOSIS — E785 Hyperlipidemia, unspecified: Secondary | ICD-10-CM

## 2022-07-06 DIAGNOSIS — Z720 Tobacco use: Secondary | ICD-10-CM

## 2022-07-06 DIAGNOSIS — I10 Essential (primary) hypertension: Secondary | ICD-10-CM

## 2022-07-06 DIAGNOSIS — R079 Chest pain, unspecified: Secondary | ICD-10-CM | POA: Diagnosis not present

## 2022-07-06 DIAGNOSIS — D126 Benign neoplasm of colon, unspecified: Secondary | ICD-10-CM

## 2022-07-06 DIAGNOSIS — R918 Other nonspecific abnormal finding of lung field: Secondary | ICD-10-CM

## 2022-07-06 MED ORDER — AMLODIPINE BESYLATE 5 MG PO TABS: 5.0000 mg | ORAL_TABLET | Freq: Every day | ORAL | 3 refills | Status: DC

## 2022-07-06 NOTE — Patient Instructions (Signed)
Medication Instructions:  Your physician recommends that you continue on your current medications as directed. Please refer to the Current Medication list given to you today.  Decrease Norvasc  to 5 mg Daily   *If you need a refill on your cardiac medications before your next appointment, please call your pharmacy*   Lab Work: NONE   If you have labs (blood work) drawn today and your tests are completely normal, you will receive your results only by: MyChart Message (if you have MyChart) OR A paper copy in the mail If you have any lab test that is abnormal or we need to change your treatment, we will call you to review the results.   Testing/Procedures: Your physician has requested that you have a lexiscan myoview. For further information please visit [email or web address] Please follow instruction sheet, as given.    Follow-Up: At [facility], you and your health needs are our priority.  As part of our continuing mission to provide you with exceptional heart care, we have created designated Provider Care Teams.  These Care Teams include your primary Cardiologist (physician) and Advanced Practice Providers (APPs -  Physician Assistants and Nurse Practitioners) who all work together to provide you with the care you need, when you need it.  We recommend signing up for the patient portal called "MyChart".  Sign up information is provided on this After Visit Summary.  MyChart is used to connect with patients for Virtual Visits (Telemedicine).  Patients are able to view lab/test results, encounter notes, upcoming appointments, etc.  Non-urgent messages can be sent to your provider as well.   To learn more about what you can do with MyChart, go to [email or web address].    Your next appointment:   1 month(s)  Provider:   Sharlene Dory, NP    Other Instructions Thank you for choosing [facility]!

## 2022-07-06 NOTE — Progress Notes (Unsigned)
Office Visit    Patient Name: Tammy Proctor Date of Encounter: 07/06/2022  PCP:  Gabriel Earing, FNP   [facility]  Cardiologist:  None *** Advanced Practice Provider:  No care team member to display Electrophysiologist:  None  {Press F2 to show EP APP, CHF, sleep or structural heart MD               :[id number]}  { Click here to update then REFRESH NOTE - MD (PCP) or APP (Team Member)  Change PCP Type for MD, Specialty for APP is either Cardiology or Clinical Cardiac Electrophysiology  :[id number]}  Chief Complaint    VEEHA BACKES is a 67 y.o. female with a hx of hypertension, hyperlipidemia, migraines, depression/anxiety, partially obstructing tumor of transverse colon on colonoscopy in 2022, COPD, tobacco abuse, and history of pulmonary nodules, who presents today for hospital follow-up.  Past Medical History    Past Medical History:  Diagnosis Date   Anxiety    Depression    HLD (hyperlipidemia)    Hypertension    Migraine    resolved   Past Surgical History:  Procedure Laterality Date   BIOPSY  01/25/2020   Procedure: BIOPSY;  Surgeon: Lanelle Bal, DO;  [facility];  Service: Endoscopy;;   BREAST EXCISIONAL BIOPSY Right    benign   CATARACT EXTRACTION Right    COLONOSCOPY WITH PROPOFOL N/A 01/25/2020   Procedure: COLONOSCOPY WITH PROPOFOL;  Surgeon: Lanelle Bal, DO;  [facility];  Service: Endoscopy;  Laterality: N/A;  9:00am   COLONOSCOPY WITH PROPOFOL N/A 03/03/2020   Procedure: COLONOSCOPY WITH PROPOFOL;  Surgeon: Lanelle Bal, DO;  [facility];  Service: Endoscopy;  Laterality: N/A;  10:15am   NASAL SINUS SURGERY     POLYPECTOMY  03/03/2020   Procedure: POLYPECTOMY;  Surgeon: Lanelle Bal, DO;  [facility];  Service: Endoscopy;;   TUBAL LIGATION      Allergies  Allergies  Allergen Reactions   Ceclor [Cefaclor] Hives    History of Present Illness     ANNESOPHIE SURI is a 67 y.o. female with a PMH as mentioned above.  Previous history of partially obstructing tumor transverse colon on colonoscopy in 2022, never followed up with surgery.  CT was concerning for ascending colon carcinoma.  Admitted in April 2024 for chest pain.  D-dimer 0.75.  CTA negative for PE, 2 tiny right sided upper lobe lung nodules, 1 was found to be cavitary.  Was recommended to repeat with noncontrast CT of the chest in 3 to 6 months.  Chest x-ray revealed densities projected over both lower lobes, pulmonary nodule/neoplasm cannot be excluded.  Tropes negative x 2.  EKG showed sinus rhythm with diffuse prominent T waves.  Echo was benign.  She was advised to follow-up with outpatient cardiology to consider outpatient stress testing.     EKGs/Labs/Other Studies Reviewed:   The following studies were reviewed today: ***  EKG:  EKG is *** ordered today.  The ekg ordered today demonstrates ***  Recent Labs: 06/25/2022: ALT 18; BUN 12; Creatinine, Ser 0.86; Hemoglobin 12.8; Platelets 272; Potassium 4.3; Sodium 137; TSH 1.640  Recent Lipid Panel    Component Value Date/Time   CHOL 209 (H) 06/25/2022 1113   TRIG 65 06/25/2022 1113   HDL 146 06/25/2022 1113   CHOLHDL 1.4 06/25/2022 1113   CHOLHDL NOT CALCULATED 06/15/2022 1540   VLDL 9 06/15/2022 1540  LDLCALC 51 06/25/2022 1113   LDLDIRECT 54 06/16/2022 1610    Risk Assessment/Calculations:  {Does this patient have ATRIAL FIBRILLATION?:[phone]}  Home Medications   No outpatient medications have been marked as taking for the 07/06/22 encounter (Appointment) with Sharlene Dory, NP.     Review of Systems   ***   All other systems reviewed and are otherwise negative except as noted above.  Physical Exam    VS:  There were no vitals taken for this visit. , BMI There is no height or weight on file to calculate BMI.  Wt Readings from Last 3 Encounters:  06/25/22 109 lb 2 oz (49.5 kg)  06/15/22  104 lb 0.9 oz (47.2 kg)  04/02/22 119 lb 14.9 oz (54.4 kg)     GEN: Well nourished, well developed, in no acute distress. HEENT: normal. Neck: Supple, no JVD, carotid bruits, or masses. Cardiac: ***RRR, no murmurs, rubs, or gallops. No clubbing, cyanosis, edema.  ***Radials/PT 2+ and equal bilaterally.  Respiratory:  ***Respirations regular and unlabored, clear to auscultation bilaterally. GI: Soft, nontender, nondistended. MS: No deformity or atrophy. Skin: Warm and dry, no rash. Neuro:  Strength and sensation are intact. Psych: Normal affect.  Assessment & Plan    ***  Shared Decision Making/Informed Consent{ All outpatient stress tests require an informed consent ([id number]) ATTESTATION ORDER       :[id number]} The risks [chest pain, shortness of breath, cardiac arrhythmias, dizziness, blood pressure fluctuations, myocardial infarction, stroke/transient ischemic attack, nausea, vomiting, allergic reaction, radiation exposure, metallic taste sensation and life-threatening complications (estimated to be 1 in 10,000)], benefits (risk stratification, diagnosing coronary artery disease, treatment guidance) and alternatives of a nuclear stress test were discussed in detail with Ms. Slimmer and she agrees to proceed.      Disposition: Follow up {follow up:15908} with None or APP.  Signed, Sharlene Dory, NP 07/06/2022, 12:49 PM [facility]

## 2022-07-16 ENCOUNTER — Telehealth: Payer: Self-pay | Admitting: Cardiology

## 2022-07-16 ENCOUNTER — Encounter (HOSPITAL_COMMUNITY): Payer: Medicare HMO

## 2022-07-16 NOTE — Telephone Encounter (Signed)
I spoke with neighbor as patient does not have a phone.This is a lexiscan,she does not need to hold any medications,just be NPO 6 hours before.Neighbor verified instructions.

## 2022-07-16 NOTE — Telephone Encounter (Signed)
Pt called to r/s stress test. She stated that she was not sure if she is supposed to hold any medications the day of her test. Best number to reach pt is [phone].

## 2022-07-24 ENCOUNTER — Encounter: Payer: Self-pay | Admitting: General Surgery

## 2022-07-24 ENCOUNTER — Ambulatory Visit: Payer: Medicare HMO | Admitting: General Surgery

## 2022-07-24 VITALS — BP 123/84 | HR 75 | Temp 98.2°F | Resp 16 | Ht 63.0 in | Wt 107.0 lb

## 2022-07-24 DIAGNOSIS — K6389 Other specified diseases of intestine: Secondary | ICD-10-CM | POA: Diagnosis not present

## 2022-07-24 NOTE — Patient Instructions (Signed)
Will refer you back to Dr. [name] for a follow up colonoscopy.

## 2022-07-25 ENCOUNTER — Telehealth: Payer: Self-pay | Admitting: Internal Medicine

## 2022-07-25 ENCOUNTER — Encounter (HOSPITAL_COMMUNITY): Admission: RE | Admit: 2022-07-25 | Payer: Medicare HMO | Source: Ambulatory Visit

## 2022-07-25 ENCOUNTER — Encounter (HOSPITAL_COMMUNITY): Payer: Medicare HMO

## 2022-07-25 NOTE — Telephone Encounter (Signed)
Dr York Ram office is wanting [place] to schedule a follow up colonoscopy with Dr [name] only. Dr [name] is scheduled out to July and his schedule isn't available yet. Will it be OK to schedule with him since he mainly wanting to see new patients only? Please advise. Patient last seen in 2022 by Ermalinda Memos, PA

## 2022-07-25 NOTE — Telephone Encounter (Signed)
Noted  

## 2022-07-25 NOTE — Progress Notes (Signed)
Tammy Proctor; [id number]; 1955/12/20   HPI Patient is a 67 year old white female who was referred to my care by Tammy Mares, NP for evaluation and treatment of a colon mass.  Patient originally had a colonoscopy by Dr. [name] of gastroenterology in 2022 and was found to have a submucosal mass in the proximal transverse colon.  Biopsies of this were negative for malignancy.  The patient was supposed to see me in my office but got lost to follow-up.  She had had a previous CT scan of the abdomen and pelvis in 2021 which showed a mass in the ascending colon.  Patient now presents for surgical evaluation.  Patient denies any family history of colon cancer or blood per rectum. Past Medical History:  Diagnosis Date   Anxiety    Depression    HLD (hyperlipidemia)    Hypertension    Migraine    resolved    Past Surgical History:  Procedure Laterality Date   BIOPSY  01/25/2020   Procedure: BIOPSY;  Surgeon: Lanelle Bal, DO;  [facility];  Service: Endoscopy;;   BREAST EXCISIONAL BIOPSY Right    benign   CATARACT EXTRACTION Right    COLONOSCOPY WITH PROPOFOL N/A 01/25/2020   Procedure: COLONOSCOPY WITH PROPOFOL;  Surgeon: Lanelle Bal, DO;  [facility];  Service: Endoscopy;  Laterality: N/A;  9:00am   COLONOSCOPY WITH PROPOFOL N/A 03/03/2020   Procedure: COLONOSCOPY WITH PROPOFOL;  Surgeon: Lanelle Bal, DO;  [facility];  Service: Endoscopy;  Laterality: N/A;  10:15am   NASAL SINUS SURGERY     POLYPECTOMY  03/03/2020   Procedure: POLYPECTOMY;  Surgeon: Lanelle Bal, DO;  [facility];  Service: Endoscopy;;   TUBAL LIGATION      Family History  Problem Relation Age of Onset   Cancer Mother        breast   Diabetes Mother    Heart disease Father    Mental illness Father    Heart attack Father    Heart attack Sister    Heart disease Sister    Heart disease Sister    Diabetes Sister    Cancer Brother         prostate   Diabetes Brother    Colon cancer Neg Hx     Current Outpatient Medications on File Prior to Visit  Medication Sig Dispense Refill   acetaminophen (TYLENOL) 325 MG tablet Take 2 tablets (650 mg total) by mouth every 6 (six) hours as needed for mild pain (or Fever >/= 101). 100 tablet 1   amLODipine (NORVASC) 5 MG tablet Take 1 tablet (5 mg total) by mouth daily. 90 tablet 3   aspirin EC 81 MG tablet Take 1 tablet (81 mg total) by mouth daily with breakfast. Swallow whole. 30 tablet 11   atorvastatin (LIPITOR) 40 MG tablet Take 1 tablet (40 mg total) by mouth daily. 30 tablet 5   sertraline (ZOLOFT) 50 MG tablet Take 1 tablet (50 mg total) by mouth daily. 30 tablet 11   traZODone (DESYREL) 50 MG tablet Take 1 tablet (50 mg total) by mouth at bedtime. 30 tablet 5   fluticasone (FLONASE) 50 MCG/ACT nasal spray Place 2 sprays into both nostrils daily. (Patient not taking: Reported on 07/06/2022) 16 g 0   varenicline (CHANTIX) 0.5 MG tablet Days 1 to 3: take 0.5 mg by mouth once daily. Days 4 to 7: 0.5 mg by mouth twice daily. (Patient not taking:  Reported on 07/06/2022) 11 tablet 0   varenicline (CHANTIX) 1 MG tablet Take 1 tablet (1 mg total) by mouth 2 (two) times daily. (Patient not taking: Reported on 07/06/2022) 180 tablet 0   No current facility-administered medications on file prior to visit.    Allergies  Allergen Reactions   Ceclor [Cefaclor] Hives    Social History   Substance and Sexual Activity  Alcohol Use Yes   Alcohol/week: 6.0 standard drinks of alcohol   Types: 6 Cans of beer per week   Comment: occasional    Social History   Tobacco Use  Smoking Status Every Day   Packs/day: 0.50   Years: 52.00   Additional pack years: 0.00   Total pack years: 26.00   Types: Cigarettes   Start date: 08/12/1974  Smokeless Tobacco Never    Review of Systems  Constitutional:  Positive for malaise/fatigue.  HENT:  Positive for sinus pain.   Eyes:  Positive for blurred  vision.  Respiratory:  Positive for cough.   Cardiovascular: Negative.   Gastrointestinal:  Positive for abdominal pain and nausea.  Genitourinary:  Positive for frequency.  Musculoskeletal:  Positive for back pain, joint pain and neck pain.  Skin: Negative.   Neurological:  Positive for headaches.  Endo/Heme/Allergies: Negative.   Psychiatric/Behavioral: Negative.      Objective   Vitals:   07/24/22 1251  BP: 123/84  Pulse: 75  Resp: 16  Temp: 98.2 F (36.8 C)  SpO2: 93%    Physical Exam Vitals reviewed.  Constitutional:      Appearance: Normal appearance. She is normal weight. She is not ill-appearing.  HENT:     Head: Normocephalic and atraumatic.  Cardiovascular:     Rate and Rhythm: Normal rate and regular rhythm.     Heart sounds: Normal heart sounds. No murmur heard.    No friction rub. No gallop.  Pulmonary:     Effort: Pulmonary effort is normal. No respiratory distress.     Breath sounds: Normal breath sounds. No stridor. No wheezing, rhonchi or rales.  Abdominal:     General: Abdomen is flat. Bowel sounds are normal. There is no distension.     Palpations: Abdomen is soft. There is no mass.     Tenderness: There is no abdominal tenderness. There is no guarding or rebound.     Hernia: No hernia is present.  Skin:    General: Skin is warm and dry.  Neurological:     Mental Status: She is alert and oriented to person, place, and time.    Previous colonoscopy note and CT scan report reviewed Assessment  History of colon mass Plan  I will refer the patient back to Dr. [name] of GI for a follow-up colonoscopy.  Further surgical management is pending those results.

## 2022-07-25 NOTE — Telephone Encounter (Signed)
I think it would be ok for her to see an APP if she is agreeable. I reviewed Dr. Lovell Sheehan note. He wants her to have a colonoscopy with Dr. [name], due to history of colon mass, prior to proceeding with any sort of surgical intervention.

## 2022-07-26 ENCOUNTER — Encounter: Payer: Self-pay | Admitting: Internal Medicine

## 2022-07-26 NOTE — Telephone Encounter (Signed)
I have tried multiple times to reach patient and so has Christina Six at Dr York Ram office. I was trying to get patient in today to see [facility] at 130pm. I will make OV and mail appointment letter to patient.

## 2022-08-01 ENCOUNTER — Ambulatory Visit (HOSPITAL_COMMUNITY)
Admission: RE | Admit: 2022-08-01 | Discharge: 2022-08-01 | Disposition: A | Discharge status: Home / Self Care | Payer: Medicare HMO | Source: Ambulatory Visit | Attending: Nurse Practitioner | Admitting: Nurse Practitioner

## 2022-08-01 ENCOUNTER — Ambulatory Visit (HOSPITAL_BASED_OUTPATIENT_CLINIC_OR_DEPARTMENT_OTHER)
Admission: RE | Admit: 2022-08-01 | Discharge: 2022-08-01 | Disposition: A | Discharge status: Home / Self Care | Payer: Medicare HMO | Source: Ambulatory Visit | Attending: Nurse Practitioner | Admitting: Nurse Practitioner

## 2022-08-01 DIAGNOSIS — R079 Chest pain, unspecified: Secondary | ICD-10-CM

## 2022-08-01 LAB — NM MYOCAR MULTI W/SPECT W/WALL MOTION / EF
Base ST Depression (mm): 0 mm
LV dias vol: 90 mL (ref 46–106)
LV sys vol: 29 mL
Nuc Stress EF: 68 %
Peak HR: 109 {beats}/min
RATE: 0.3
Rest HR: 78 {beats}/min
Rest Nuclear Isotope Dose: 9.9 mCi
SDS: 3
SRS: 0
SSS: 3
ST Depression (mm): 0 mm
Stress Nuclear Isotope Dose: 31 mCi
TID: 0.7

## 2022-08-01 MED ORDER — TECHNETIUM TC 99M TETROFOSMIN IV KIT
30.0000 | PACK | Freq: Once | INTRAVENOUS | Status: AC | PRN
  Administered 2022-08-01: 31 via INTRAVENOUS

## 2022-08-01 MED ORDER — REGADENOSON 0.4 MG/5ML IV SOLN
INTRAVENOUS | Status: AC
  Administered 2022-08-01: 0.4 mg via INTRAVENOUS
  Filled 2022-08-01: qty 5

## 2022-08-01 MED ORDER — SODIUM CHLORIDE FLUSH 0.9 % IV SOLN
INTRAVENOUS | Status: AC
  Administered 2022-08-01: 10 mL via INTRAVENOUS
  Filled 2022-08-01: qty 10

## 2022-08-01 MED ORDER — TECHNETIUM TC 99M TETROFOSMIN IV KIT
10.0000 | PACK | Freq: Once | INTRAVENOUS | Status: AC | PRN
  Administered 2022-08-01: 9.87 via INTRAVENOUS

## 2022-08-06 ENCOUNTER — Ambulatory Visit: Payer: PRIVATE HEALTH INSURANCE | Admitting: Family Medicine

## 2022-08-10 ENCOUNTER — Ambulatory Visit: Payer: Medicare HMO | Attending: Nurse Practitioner | Admitting: Nurse Practitioner

## 2022-08-10 VITALS — BP 122/60 | HR 75 | Ht 63.0 in | Wt 104.4 lb

## 2022-08-10 DIAGNOSIS — E785 Hyperlipidemia, unspecified: Secondary | ICD-10-CM | POA: Diagnosis not present

## 2022-08-10 DIAGNOSIS — F1290 Cannabis use, unspecified, uncomplicated: Secondary | ICD-10-CM

## 2022-08-10 DIAGNOSIS — D126 Benign neoplasm of colon, unspecified: Secondary | ICD-10-CM | POA: Diagnosis not present

## 2022-08-10 DIAGNOSIS — Z720 Tobacco use: Secondary | ICD-10-CM

## 2022-08-10 DIAGNOSIS — I10 Essential (primary) hypertension: Secondary | ICD-10-CM | POA: Diagnosis not present

## 2022-08-10 DIAGNOSIS — R918 Other nonspecific abnormal finding of lung field: Secondary | ICD-10-CM

## 2022-08-10 DIAGNOSIS — Z01810 Encounter for preprocedural cardiovascular examination: Secondary | ICD-10-CM | POA: Diagnosis not present

## 2022-08-10 NOTE — Progress Notes (Signed)
Office Visit    Patient Name: Tammy Proctor Date of Encounter: 08/10/2022  PCP:  Gabriel Earing, FNP   [facility]  Cardiologist:  Dina Rich, MD  Advanced Practice Provider:  No care team member to display Electrophysiologist:  None   Chief Complaint    Tammy Proctor is a 67 y.o. female with a hx of hypertension, hyperlipidemia, migraines, depression, anxiety, partially obstructing tumor of transverse colon on colonoscopy in 2022, COPD, polysubstance abuse, and history of pulmonary nodules, who presents today for follow-up.  Past Medical History    Past Medical History:  Diagnosis Date   Anxiety    Depression    HLD (hyperlipidemia)    Hypertension    Migraine    resolved   Past Surgical History:  Procedure Laterality Date   BIOPSY  01/25/2020   Procedure: BIOPSY;  Surgeon: Lanelle Bal, DO;  [facility];  Service: Endoscopy;;   BREAST EXCISIONAL BIOPSY Right    benign   CATARACT EXTRACTION Right    COLONOSCOPY WITH PROPOFOL N/A 01/25/2020   Procedure: COLONOSCOPY WITH PROPOFOL;  Surgeon: Lanelle Bal, DO;  [facility];  Service: Endoscopy;  Laterality: N/A;  9:00am   COLONOSCOPY WITH PROPOFOL N/A 03/03/2020   Procedure: COLONOSCOPY WITH PROPOFOL;  Surgeon: Lanelle Bal, DO;  [facility];  Service: Endoscopy;  Laterality: N/A;  10:15am   NASAL SINUS SURGERY     POLYPECTOMY  03/03/2020   Procedure: POLYPECTOMY;  Surgeon: Lanelle Bal, DO;  [facility];  Service: Endoscopy;;   TUBAL LIGATION      Allergies  Allergies  Allergen Reactions   Ceclor [Cefaclor] Hives    History of Present Illness    Tammy Proctor is a 67 y.o. female with a PMH as mentioned above.  Previous history of partially obstructing tumor transverse colon on colonoscopy in 2022, never followed up with surgery.  CT was concerning for ascending colon carcinoma.  Admitted in April  2024 for chest pain.  D-dimer 0.75.  CTA negative for PE, 2 tiny right sided upper lobe lung nodules, 1 was found to be cavitary.  Was recommended to repeat with noncontrast CT of the chest in 3 to 6 months.  Chest x-ray revealed densities projected over both lower lobes, pulmonary nodule/neoplasm cannot be excluded.  Trops negative x 2.  EKG showed sinus rhythm with diffuse prominent T waves.  Echo was benign.  She was advised to follow-up with outpatient cardiology to consider outpatient stress testing.  I last saw her for hospital follow-up 07/06/2022. CP improved, noted fatigue and occasional orthostatic dizziness. Noted positive FH of CVD. Her father died of a MI in his 85's, sister had history of heart problems and PPM. Amlodipine decreased. Myoview low risk.  Today she presents for follow-up. Doing great and denies any acute cardiac complaints or issues. Denies any chest pain, shortness of breath, palpitations, syncope, presyncope, dizziness, orthopnea, PND, swelling or significant weight changes, acute bleeding, or claudication.  SH: Occasional Etoh use, smokes 1/2 PPD, occasional marijuana use. Retired from working in school system.  EKGs/Labs/Other Studies Reviewed:   The following studies were reviewed today:   EKG:  EKG is not ordered today.    Myoview 07/06/2022:    Stress ECG is negative for ischemia and arrhythmias.   LV perfusion is normal. There is no evidence of ischemia. There is no evidence of infarction.   Left ventricular function is  normal. Nuclear stress EF: 68 %.   Findings are consistent with no ischemia and no infarction. The study is low risk.  Stress ECG is negative for ischemia and arrhythmias.   LV perfusion is normal. There is no evidence of ischemia. There is no evidence of infarction.   Left ventricular function is normal. Nuclear stress EF: 68 %.   Findings are consistent with no ischemia and no infarction. The study is low risk.  Echo 05/2022: 1. Left  ventricular ejection fraction, by estimation, is 60 to 65%. The  left ventricle has normal function. The left ventricle has no regional  wall motion abnormalities. Left ventricular diastolic parameters are  consistent with Grade I diastolic  dysfunction (impaired relaxation).   2. Right ventricular systolic function is normal. The right ventricular  size is normal. Tricuspid regurgitation signal is inadequate for assessing  PA pressure.   3. The mitral valve is normal in structure. No evidence of mitral valve  regurgitation. No evidence of mitral stenosis.   4. The tricuspid valve is abnormal.   5. The aortic valve is tricuspid. Aortic valve regurgitation is not  visualized. No aortic stenosis is present.   6. The inferior vena cava is normal in size with greater than 50%  respiratory variability, suggesting right atrial pressure of 3 mmHg.  Recent Labs: 06/25/2022: ALT 18; BUN 12; Creatinine, Ser 0.86; Hemoglobin 12.8; Platelets 272; Potassium 4.3; Sodium 137; TSH 1.640  Recent Lipid Panel    Component Value Date/Time   CHOL 209 (H) 06/25/2022 1113   TRIG 65 06/25/2022 1113   HDL 146 06/25/2022 1113   CHOLHDL 1.4 06/25/2022 1113   CHOLHDL NOT CALCULATED 06/15/2022 1540   VLDL 9 06/15/2022 1540   LDLCALC 51 06/25/2022 1113   LDLDIRECT 54 06/16/2022 0613   Home Medications   Current Meds  Medication Sig   acetaminophen (TYLENOL) 325 MG tablet Take 2 tablets (650 mg total) by mouth every 6 (six) hours as needed for mild pain (or Fever >/= 101).   amLODipine (NORVASC) 5 MG tablet Take 1 tablet (5 mg total) by mouth daily.   aspirin EC 81 MG tablet Take 1 tablet (81 mg total) by mouth daily with breakfast. Swallow whole.   atorvastatin (LIPITOR) 40 MG tablet Take 1 tablet (40 mg total) by mouth daily.   sertraline (ZOLOFT) 50 MG tablet Take 1 tablet (50 mg total) by mouth daily.   traZODone (DESYREL) 50 MG tablet Take 1 tablet (50 mg total) by mouth at bedtime.    Review of  Systems    All other systems reviewed and are otherwise negative except as noted above.  Physical Exam    VS:  BP 122/60   Pulse 75   Ht 5\' 3"  (1.6 m)   Wt 104 lb 6.4 oz (47.4 kg)   SpO2 98%   BMI 18.49 kg/m  , BMI Body mass index is 18.49 kg/m.  Wt Readings from Last 3 Encounters:  08/10/22 104 lb 6.4 oz (47.4 kg)  07/24/22 107 lb (48.5 kg)  07/06/22 111 lb 3.2 oz (50.4 kg)     GEN: Well nourished, well developed, in no acute distress. HEENT: normal. Neck: Supple, no JVD, carotid bruits, or masses. Cardiac: S1/S2, RRR, no murmurs, rubs, or gallops. No clubbing, cyanosis, edema.  Radials/PT 2+ and equal bilaterally.  Respiratory:  Respirations regular and unlabored, clear to auscultation bilaterally. MS: No deformity or atrophy. Skin: Warm and dry, no rash. Neuro:  Strength and sensation are intact. Psych:  Normal affect.  Assessment & Plan    Pre-operative cardiovascular clearance Ms. Hemann's perioperative risk of a major cardiac event is 0.9% according to the Revised Cardiac Risk Index (RCRI).  Therefore, she is at low risk for perioperative complications.   Her functional capacity is excellent at 6.61 METs according to the Duke Activity Status Index (DASI). Recommendations: According to ACC/AHA guidelines, no further cardiovascular testing needed.  The patient may proceed to surgery at acceptable risk.   Antiplatelet and/or Anticoagulation Recommendations: For procedures with low risk of bleeding, we recommend aspirin to be continued perioperatively.  However, if GI surgeon feels as though she is at high risk for bleeding with procedure, okay to hold aspirin 7 days prior to surgery and resume when it is felt safe to do so.  2. HTN Blood pressure stable. Continue current medication regimen. Discussed to monitor BP at home at least 2 hours after medications and sitting for 5-10 minutes. Heart healthy diet encouraged.   3. HLD Recent LDL 51.  Continue atorvastatin.   Heart healthy diet encouraged.  4. Tubular adenoma of colon Pathology report from 2021 revealed submucosal ulcerated tubular adenoma.  Has upcoming appointment with Dr. Lovell Sheehan, I suspect she will most likely need surgery at some point.  See preoperative clearance that I have addressed above (#1).   5. Pulmonary nodules Past CT of chest revealed 2 tiny right-sided upper lobe lung nodules, one was found to be cavitary.  Was recommended to repeat noncontrast chest CT in 3 to 6 months.  Will arrange to get this scheduled for 11/2022.   6. Tobacco abuse, marijuana use Smokes half pack per day.  Discussed/encourage smoking cessation.  Occasional marijuana use, and I discussed risks associated with this, encouraged to avoid/wean use.  She verbalized understanding.   Disposition: Follow up in 6 months with Dina Rich, MD or [facility].  Signed, Sharlene Dory, NP 08/10/2022, 1:27 PM  [facility]

## 2022-08-10 NOTE — Patient Instructions (Addendum)
Medication Instructions:  Your physician recommends that you continue on your current medications as directed. Please refer to the Current Medication list given to you today.  Labwork: none  Testing/Procedures: none  Follow-Up: Your physician recommends that you schedule a follow-up appointment in: 6 months with Dr. Branch  Any Other Special Instructions Will Be Listed Below (If Applicable).  If you need a refill on your cardiac medications before your next appointment, please call your pharmacy. 

## 2022-08-18 NOTE — Progress Notes (Deleted)
Referring Provider: Gabriel Earing, FNP Primary Care Physician:  Gabriel Earing, FNP Primary GI Physician: Dr. [name]  No chief complaint on file.   HPI:   Tammy Proctor is a 67 y.o. female presenting today with a history of adenomatous colon polyps, colon mass with biopsies showing tubular adenoma in 2021, presenting today for follow-up to discuss scheduling colonoscopy.  Prior evaluation/recommendations:  Colonoscopy 01/25/2020: inadequate prep, nonbleeding internal hemorrhoids, sigmoid and descending colon diverticulosis, submucosal and ulcerated nonobstructing mass in the transverse colon s/p biopsy and tattooed.  Pathology with fragment of ulcerated tubular adenoma.  Colonoscopy 03/03/2020: Nonbleeding internal hemorrhoids, sigmoid and descending colon diverticulosis, 13 mm tubular adenoma in the sigmoid colon resected and retrieved, partially obstructing tumor in the transverse colon.  Referred to general surgery and recommended surveillance colonoscopy in 1 year.    A referral referral was sent to general surgery, but patient was unable to reach to schedule an appointment.  Another referral was sent to general surgery by her primary care provider in April 2024.  She saw Dr. Lovell Sheehan on 07/24/2022 who recommended seeing Dr. [name] for follow-up colonoscopy with further surgical management pending these results.  Today:    Past Medical History:  Diagnosis Date   Anxiety    Depression    HLD (hyperlipidemia)    Hypertension    Migraine    resolved    Past Surgical History:  Procedure Laterality Date   BIOPSY  01/25/2020   Procedure: BIOPSY;  Surgeon: Lanelle Bal, DO;  [facility];  Service: Endoscopy;;   BREAST EXCISIONAL BIOPSY Right    benign   CATARACT EXTRACTION Right    COLONOSCOPY WITH PROPOFOL N/A 01/25/2020   Procedure: COLONOSCOPY WITH PROPOFOL;  Surgeon: Lanelle Bal, DO;  [facility];  Service: Endoscopy;   Laterality: N/A;  9:00am   COLONOSCOPY WITH PROPOFOL N/A 03/03/2020   Procedure: COLONOSCOPY WITH PROPOFOL;  Surgeon: Lanelle Bal, DO;  [facility];  Service: Endoscopy;  Laterality: N/A;  10:15am   NASAL SINUS SURGERY     POLYPECTOMY  03/03/2020   Procedure: POLYPECTOMY;  Surgeon: Lanelle Bal, DO;  [facility];  Service: Endoscopy;;   TUBAL LIGATION      Current Outpatient Medications  Medication Sig Dispense Refill   acetaminophen (TYLENOL) 325 MG tablet Take 2 tablets (650 mg total) by mouth every 6 (six) hours as needed for mild pain (or Fever >/= 101). 100 tablet 1   amLODipine (NORVASC) 5 MG tablet Take 1 tablet (5 mg total) by mouth daily. 90 tablet 3   aspirin EC 81 MG tablet Take 1 tablet (81 mg total) by mouth daily with breakfast. Swallow whole. 30 tablet 11   atorvastatin (LIPITOR) 40 MG tablet Take 1 tablet (40 mg total) by mouth daily. 30 tablet 5   fluticasone (FLONASE) 50 MCG/ACT nasal spray Place 2 sprays into both nostrils daily. (Patient not taking: Reported on 07/06/2022) 16 g 0   sertraline (ZOLOFT) 50 MG tablet Take 1 tablet (50 mg total) by mouth daily. 30 tablet 11   traZODone (DESYREL) 50 MG tablet Take 1 tablet (50 mg total) by mouth at bedtime. 30 tablet 5   varenicline (CHANTIX) 0.5 MG tablet Days 1 to 3: take 0.5 mg by mouth once daily. Days 4 to 7: 0.5 mg by mouth twice daily. (Patient not taking: Reported on 07/06/2022) 11 tablet 0   varenicline (CHANTIX) 1 MG tablet Take 1  tablet (1 mg total) by mouth 2 (two) times daily. (Patient not taking: Reported on 07/06/2022) 180 tablet 0   No current facility-administered medications for this visit.    Allergies as of 08/20/2022 - Review Complete 08/10/2022  Allergen Reaction Noted   Ceclor [cefaclor] Hives 07/06/2013    Family History  Problem Relation Age of Onset   Cancer Mother        breast   Diabetes Mother    Heart disease Father    Mental illness Father    Heart attack  Father    Heart attack Sister    Heart disease Sister    Heart disease Sister    Diabetes Sister    Cancer Brother        prostate   Diabetes Brother    Colon cancer Neg Hx     Social History   Socioeconomic History   Marital status: Divorced    Spouse name: Not on file   Number of children: 3   Years of education: 14   Highest education level: Associate degree: academic program  Occupational History   Not on file  Tobacco Use   Smoking status: Every Day    Packs/day: 0.50    Years: 52.00    Additional pack years: 0.00    Total pack years: 26.00    Types: Cigarettes    Start date: 08/12/1974   Smokeless tobacco: Never  Vaping Use   Vaping Use: Never used  Substance and Sexual Activity   Alcohol use: Yes    Alcohol/week: 6.0 standard drinks of alcohol    Types: 6 Cans of beer per week    Comment: occasional   Drug use: Yes    Types: Marijuana    Comment: once per month   Sexual activity: Yes    Birth control/protection: Surgical  Other Topics Concern   Not on file  Social History Narrative   Not on file   Social Determinants of Health   Financial Resource Strain: Not on file  Food Insecurity: No Food Insecurity (06/15/2022)   Hunger Vital Sign    Worried About Running Out of Food in the Last Year: Never true    Ran Out of Food in the Last Year: Never true  Transportation Needs: No Transportation Needs (06/15/2022)   PRAPARE - Administrator, Civil Service (Medical): No    Lack of Transportation (Non-Medical): No  Physical Activity: Not on file  Stress: Not on file  Social Connections: Not on file    Review of Systems: Gen: Denies fever, chills, anorexia. Denies fatigue, weakness, weight loss.  CV: Denies chest pain, palpitations, syncope, peripheral edema, and claudication. Resp: Denies dyspnea at rest, cough, wheezing, coughing up blood, and pleurisy. GI: Denies vomiting blood, jaundice, and fecal incontinence.   Denies dysphagia or  odynophagia. Derm: Denies rash, itching, dry skin Psych: Denies depression, anxiety, memory loss, confusion. No homicidal or suicidal ideation.  Heme: Denies bruising, bleeding, and enlarged lymph nodes.  Physical Exam: There were no vitals taken for this visit. General:   Alert and oriented. No distress noted. Pleasant and cooperative.  Head:  Normocephalic and atraumatic. Eyes:  Conjuctiva clear without scleral icterus. Heart:  S1, S2 present without murmurs appreciated. Lungs:  Clear to auscultation bilaterally. No wheezes, rales, or rhonchi. No distress.  Abdomen:  +BS, soft, non-tender and non-distended. No rebound or guarding. No HSM or masses noted. Msk:  Symmetrical without gross deformities. Normal posture. Extremities:  Without edema. Neurologic:  Alert and  oriented x4 Psych:  Normal mood and affect.    Assessment:     Plan:  ***   Ermalinda Memos, PA-C [facility] 08/20/2022

## 2022-08-20 ENCOUNTER — Ambulatory Visit: Payer: Medicare HMO | Admitting: Gastroenterology

## 2022-08-23 ENCOUNTER — Telehealth: Payer: Self-pay | Admitting: Nurse Practitioner

## 2022-08-23 ENCOUNTER — Other Ambulatory Visit: Payer: Self-pay | Admitting: Nurse Practitioner

## 2022-08-23 DIAGNOSIS — R918 Other nonspecific abnormal finding of lung field: Secondary | ICD-10-CM

## 2022-08-23 NOTE — Telephone Encounter (Signed)
Spoke with patient about scheduling another Chest CT in October for pulmonary nodules per elizabeth.

## 2022-08-27 ENCOUNTER — Ambulatory Visit (INDEPENDENT_AMBULATORY_CARE_PROVIDER_SITE_OTHER): Payer: Medicare HMO

## 2022-08-27 ENCOUNTER — Encounter: Payer: Self-pay | Admitting: Family Medicine

## 2022-08-27 ENCOUNTER — Other Ambulatory Visit: Payer: Self-pay | Admitting: Family Medicine

## 2022-08-27 ENCOUNTER — Ambulatory Visit (INDEPENDENT_AMBULATORY_CARE_PROVIDER_SITE_OTHER): Payer: Medicare HMO | Admitting: Family Medicine

## 2022-08-27 VITALS — BP 118/72 | HR 80 | Temp 98.7°F | Ht 63.0 in | Wt 107.0 lb

## 2022-08-27 DIAGNOSIS — E782 Mixed hyperlipidemia: Secondary | ICD-10-CM

## 2022-08-27 DIAGNOSIS — R918 Other nonspecific abnormal finding of lung field: Secondary | ICD-10-CM | POA: Diagnosis not present

## 2022-08-27 DIAGNOSIS — M79672 Pain in left foot: Secondary | ICD-10-CM

## 2022-08-27 DIAGNOSIS — I10 Essential (primary) hypertension: Secondary | ICD-10-CM

## 2022-08-27 DIAGNOSIS — Z720 Tobacco use: Secondary | ICD-10-CM

## 2022-08-27 DIAGNOSIS — Z1231 Encounter for screening mammogram for malignant neoplasm of breast: Secondary | ICD-10-CM

## 2022-08-27 DIAGNOSIS — J0100 Acute maxillary sinusitis, unspecified: Secondary | ICD-10-CM

## 2022-08-27 DIAGNOSIS — K6389 Other specified diseases of intestine: Secondary | ICD-10-CM

## 2022-08-27 MED ORDER — AMOXICILLIN-POT CLAVULANATE 875-125 MG PO TABS
1.0000 | ORAL_TABLET | Freq: Two times a day (BID) | ORAL | 0 refills | Status: AC
Start: 2022-08-27 — End: 2022-09-03

## 2022-08-27 NOTE — Patient Instructions (Signed)
Our records indicate that you are due for your annual mammogram/breast imaging. While there is no way to prevent breast cancer, early detection provides the best opportunity for curing it. For women over the age of 40, the American Cancer Society recommends a yearly clinical breast exam and a yearly mammogram. These practices have saved thousands of lives. We need your help to ensure that you are receiving optimal medical care. Please call the imaging location that has done you previous mammograms. Please remember to list us as your primary care. This helps make sure we receive a report and can update your chart.  Below is the contact information for several local breast imaging centers. You may call the location that works best for you, and they will be happy to assistance in making you an appointment. You do not need an order for a regular screening mammogram. However, if you are having any problems or concerns with you breast area, please let your primary care provider know, and appropriate orders will be placed. Please let our office know if you have any questions or concerns. Or if you need information for another imaging center not on this list or outside of the area. We are commented to working with you on your health care journey.   The mobile unit/bus ([facility]) - they come twice a month to our location.  These appointments can be made through our office or by call The Breast Center  [facility]  [address] [place], [place] [address] Phone [phone]  [facility]  [address]  [place], [place] [address] [phone]  [facility]  [address] [place], [place] [address] [phone]  [facility] [place]  [address] [place] [address] [phone]  [facility]  [address], [address] [place] [address] [phone]  [facility] [place]  [address] [place], [place] [address] [phone]  [facility] [address] [place], [place] [address] [phone]  [facility] [address]  [address] [place], [place] [address] [phone]  [facility] [address] [place] [address] [phone]     

## 2022-08-27 NOTE — Progress Notes (Signed)
Established Patient Office Visit  Subjective   Patient ID: Tammy Proctor, female    DOB: September 23, 1955  Age: 67 y.o. MRN: [id number]  Chief Complaint  Patient presents with   Medical Management of Chronic Issues    HPI Tammy Proctor is here for a follow up. She also has a new concern today:  She injured her left foot 2 weeks ago by stubbing it on some furniture. Pain is worse with weight bearing. She denies numbness and tingling. She is taking tylenol for the pain. Pain is improving. Swelling nearly resolved. She is concerned that it may be broken.   She has also had a cough for the last few weeks. Not improving. Sinus pressure aorund her eyes. Denies fever, sore throat, shortnes so breath. She has been taking antihistamine without improvement.   She tried chantix but got sick. Currently smoking 1/2 PP. Also uses marijuana sometimes.   She did see surgeon abut colon mass. For now, surgeon referred her back to GI for another colonoscopy and plans to follow up again after. She has to reschedule GI appt however.   She did have a good visit with cardiology and did get preoperative clearance if she does need GI surgery.       ROS Negative unless specially indicated above in HPI.   Objective:     BP 118/72   Pulse 80   Temp 98.7 F (37.1 C)   Ht 5\' 3"  (1.6 m)   Wt 107 lb (48.5 kg)   SpO2 98%   BMI 18.95 kg/m    Physical Exam Vitals and nursing note reviewed.  Constitutional:      General: She is not in acute distress.    Appearance: Normal appearance. She is not ill-appearing, toxic-appearing or diaphoretic.  HENT:     Head: Normocephalic and atraumatic.     Right Ear: Tympanic membrane, ear canal and external ear normal.     Left Ear: Tympanic membrane, ear canal and external ear normal.     Nose: Congestion present.     Right Sinus: Maxillary sinus tenderness present. No frontal sinus tenderness.     Left Sinus: Maxillary sinus tenderness present. No frontal sinus  tenderness.     Mouth/Throat:     Mouth: Mucous membranes are moist.     Pharynx: Oropharynx is clear.  Eyes:     General: No scleral icterus.    Extraocular Movements: Extraocular movements intact.     Conjunctiva/sclera: Conjunctivae normal.     Pupils: Pupils are equal, round, and reactive to light.  Cardiovascular:     Rate and Rhythm: Normal rate.     Heart sounds: Normal heart sounds. No murmur heard. Pulmonary:     Effort: Pulmonary effort is normal. No respiratory distress.     Breath sounds: Normal breath sounds. No wheezing, rhonchi or rales.  Abdominal:     General: Bowel sounds are normal. There is no distension.     Palpations: Abdomen is soft.     Tenderness: There is no abdominal tenderness.  Musculoskeletal:     Cervical back: Neck supple. No rigidity.     Right lower leg: No edema.     Left lower leg: No edema.     Comments: Mild swelling to lateral distal left foot, 4th and 5th toes. Tenderness to 5th toe and base of 5th toe. Full ROM, sensation intact, brisk cap refill.   Skin:    General: Skin is warm and dry.  Neurological:  General: No focal deficit present.     Mental Status: She is alert and oriented to person, place, and time.  Psychiatric:        Mood and Affect: Mood normal.        Behavior: Behavior normal.        Thought Content: Thought content normal.        Judgment: Judgment normal.      No results found for any visits on 08/27/22.    The ASCVD Risk score (Arnett DK, et al., 2019) failed to calculate for the following reasons:   The valid HDL cholesterol range is 20 to 100 mg/dL    Assessment & Plan:   Tammy Proctor was seen today for medical management of chronic issues.  Diagnoses and all orders for this visit:  Primary hypertension Well controlled on current regimen.   Mixed hyperlipidemia Last LDL 51. Continue statin.   Pulmonary nodules Cardiology has repeat CT ordered.   Tobacco abuse Stopped chantix due to side  effects. Cessation recommended and encouraged.   Mass of colon-ulcerated mass of the transverse colon Schedule follow up with GI. She has been the surgeon who is awaiting another colonoscopy.   Left foot pain ? Fracture. Xray pending.  -     Cancel: DG Foot 2 Views Left; Future  Acute non-recurrent maxillary sinusitis Augmentin as below. Discussed symptomatic care and return precautions.  -     amoxicillin-clavulanate (AUGMENTIN) 875-125 MG tablet; Take 1 tablet by mouth 2 (two) times daily for 7 days.  Return in about 4 months (around 12/28/2022) for CPE with pap, schedule mammo.   The patient indicates understanding of these issues and agrees with the plan.  Gabriel Earing, FNP

## 2022-09-03 ENCOUNTER — Ambulatory Visit
Admission: RE | Admit: 2022-09-03 | Discharge: 2022-09-03 | Disposition: A | Discharge status: Home / Self Care | Payer: Medicare HMO | Source: Ambulatory Visit

## 2022-09-03 DIAGNOSIS — Z1231 Encounter for screening mammogram for malignant neoplasm of breast: Secondary | ICD-10-CM

## 2022-09-04 NOTE — Progress Notes (Unsigned)
Referring Provider: Gabriel Earing, FNP Primary Care Physician:  Gabriel Earing, FNP Primary GI Physician: Dr. [name]  No chief complaint on file.   HPI:   Tammy Proctor is a 67 y.o. female presenting today with a history of adenomatous colon polyps, colon mass with biopsies showing tubular adenoma in 2021, presenting today for follow-up to discuss scheduling colonoscopy.   Prior evaluation/recommendations:  Colonoscopy 01/25/2020: inadequate prep, nonbleeding internal hemorrhoids, sigmoid and descending colon diverticulosis, submucosal and ulcerated nonobstructing mass in the transverse colon s/p biopsy and tattooed.  Pathology with fragment of ulcerated tubular adenoma.   Colonoscopy 03/03/2020: Nonbleeding internal hemorrhoids, sigmoid and descending colon diverticulosis, 13 mm tubular adenoma in the sigmoid colon resected and retrieved, partially obstructing tumor in the transverse colon.  Referred to general surgery and recommended surveillance colonoscopy in 1 year.     A referral referral was sent to general surgery, but patient was unable to reach to schedule an appointment.   Another referral was sent to general surgery by her primary care provider in April 2024.   She saw Dr. Lovell Sheehan on 07/24/2022 who recommended seeing Dr. [name] for follow-up colonoscopy with further surgical management pending these results.   Today:       Past Medical History:  Diagnosis Date   Anxiety    Depression    HLD (hyperlipidemia)    Hypertension    Migraine    resolved    Past Surgical History:  Procedure Laterality Date   BIOPSY  01/25/2020   Procedure: BIOPSY;  Surgeon: Lanelle Bal, DO;  [facility];  Service: Endoscopy;;   BREAST EXCISIONAL BIOPSY Right    benign   CATARACT EXTRACTION Right    COLONOSCOPY WITH PROPOFOL N/A 01/25/2020   Procedure: COLONOSCOPY WITH PROPOFOL;  Surgeon: Lanelle Bal, DO;  [facility];  Service:  Endoscopy;  Laterality: N/A;  9:00am   COLONOSCOPY WITH PROPOFOL N/A 03/03/2020   Procedure: COLONOSCOPY WITH PROPOFOL;  Surgeon: Lanelle Bal, DO;  [facility];  Service: Endoscopy;  Laterality: N/A;  10:15am   NASAL SINUS SURGERY     POLYPECTOMY  03/03/2020   Procedure: POLYPECTOMY;  Surgeon: Lanelle Bal, DO;  [facility];  Service: Endoscopy;;   TUBAL LIGATION      Current Outpatient Medications  Medication Sig Dispense Refill   acetaminophen (TYLENOL) 325 MG tablet Take 2 tablets (650 mg total) by mouth every 6 (six) hours as needed for mild pain (or Fever >/= 101). 100 tablet 1   amLODipine (NORVASC) 5 MG tablet Take 1 tablet (5 mg total) by mouth daily. 90 tablet 3   aspirin EC 81 MG tablet Take 1 tablet (81 mg total) by mouth daily with breakfast. Swallow whole. 30 tablet 11   atorvastatin (LIPITOR) 40 MG tablet Take 1 tablet (40 mg total) by mouth daily. 30 tablet 5   fluticasone (FLONASE) 50 MCG/ACT nasal spray Place 2 sprays into both nostrils daily. 16 g 0   sertraline (ZOLOFT) 50 MG tablet Take 1 tablet (50 mg total) by mouth daily. 30 tablet 11   traZODone (DESYREL) 50 MG tablet Take 1 tablet (50 mg total) by mouth at bedtime. 30 tablet 5   varenicline (CHANTIX) 0.5 MG tablet Days 1 to 3: take 0.5 mg by mouth once daily. Days 4 to 7: 0.5 mg by mouth twice daily. 11 tablet 0   varenicline (CHANTIX) 1 MG tablet Take 1 tablet (1 mg  total) by mouth 2 (two) times daily. 180 tablet 0   No current facility-administered medications for this visit.    Allergies as of 09/06/2022 - Review Complete 08/27/2022  Allergen Reaction Noted   Ceclor [cefaclor] Hives 07/06/2013    Family History  Problem Relation Age of Onset   Cancer Mother        breast   Diabetes Mother    Heart disease Father    Mental illness Father    Heart attack Father    Heart attack Sister    Heart disease Sister    Heart disease Sister    Diabetes Sister    Cancer Brother         prostate   Diabetes Brother    Colon cancer Neg Hx    Breast cancer Neg Hx     Social History   Socioeconomic History   Marital status: Divorced    Spouse name: Not on file   Number of children: 3   Years of education: 14   Highest education level: Associate degree: academic program  Occupational History   Not on file  Tobacco Use   Smoking status: Every Day    Packs/day: 0.50    Years: 52.00    Additional pack years: 0.00    Total pack years: 26.00    Types: Cigarettes    Start date: 08/12/1974   Smokeless tobacco: Never  Vaping Use   Vaping Use: Never used  Substance and Sexual Activity   Alcohol use: Yes    Alcohol/week: 6.0 standard drinks of alcohol    Types: 6 Cans of beer per week    Comment: occasional   Drug use: Yes    Types: Marijuana    Comment: once per month   Sexual activity: Yes    Birth control/protection: Surgical  Other Topics Concern   Not on file  Social History Narrative   Not on file   Social Determinants of Health   Financial Resource Strain: Not on file  Food Insecurity: No Food Insecurity (06/15/2022)   Hunger Vital Sign    Worried About Running Out of Food in the Last Year: Never true    Ran Out of Food in the Last Year: Never true  Transportation Needs: No Transportation Needs (06/15/2022)   PRAPARE - Administrator, Civil Service (Medical): No    Lack of Transportation (Non-Medical): No  Physical Activity: Not on file  Stress: Not on file  Social Connections: Not on file    Review of Systems: Gen: Denies fever, chills, anorexia. Denies fatigue, weakness, weight loss.  CV: Denies chest pain, palpitations, syncope, peripheral edema, and claudication. Resp: Denies dyspnea at rest, cough, wheezing, coughing up blood, and pleurisy. GI: Denies vomiting blood, jaundice, and fecal incontinence.   Denies dysphagia or odynophagia. Derm: Denies rash, itching, dry skin Psych: Denies depression, anxiety, memory loss,  confusion. No homicidal or suicidal ideation.  Heme: Denies bruising, bleeding, and enlarged lymph nodes.  Physical Exam: There were no vitals taken for this visit. General:   Alert and oriented. No distress noted. Pleasant and cooperative.  Head:  Normocephalic and atraumatic. Eyes:  Conjuctiva clear without scleral icterus. Heart:  S1, S2 present without murmurs appreciated. Lungs:  Clear to auscultation bilaterally. No wheezes, rales, or rhonchi. No distress.  Abdomen:  +BS, soft, non-tender and non-distended. No rebound or guarding. No HSM or masses noted. Msk:  Symmetrical without gross deformities. Normal posture. Extremities:  Without edema. Neurologic:  Alert and  oriented x4 Psych:  Normal mood and affect.    Assessment:     Plan:  ***   Ermalinda Memos, PA-C [facility] 09/06/2022

## 2022-09-06 ENCOUNTER — Other Ambulatory Visit: Payer: Self-pay | Admitting: Family Medicine

## 2022-09-06 ENCOUNTER — Encounter: Payer: Self-pay | Admitting: Gastroenterology

## 2022-09-06 ENCOUNTER — Ambulatory Visit (INDEPENDENT_AMBULATORY_CARE_PROVIDER_SITE_OTHER): Payer: Medicare HMO | Admitting: Gastroenterology

## 2022-09-06 VITALS — BP 101/53 | HR 72 | Temp 97.9°F | Ht 63.0 in | Wt 108.0 lb

## 2022-09-06 DIAGNOSIS — R928 Other abnormal and inconclusive findings on diagnostic imaging of breast: Secondary | ICD-10-CM

## 2022-09-06 DIAGNOSIS — Z86010 Personal history of colonic polyps: Secondary | ICD-10-CM | POA: Diagnosis not present

## 2022-09-06 DIAGNOSIS — K6389 Other specified diseases of intestine: Secondary | ICD-10-CM | POA: Diagnosis not present

## 2022-09-06 NOTE — Addendum Note (Signed)
Addended by: Sharlene Dory on: 09/06/2022 11:07 AM   Modules accepted: Orders, Level of Service

## 2022-09-06 NOTE — Patient Instructions (Signed)
We will arrange for you to have a colonoscopy in the near future with Dr. [name].  To help with your bowel regularity, start Benefiber 2 teaspoons daily x 2 weeks, then increase to twice daily.  We will follow-up with you in the office as Dr. [name] recommends.  Ermalinda Memos, PA-C [facility]

## 2022-09-11 ENCOUNTER — Other Ambulatory Visit: Payer: Medicare HMO

## 2022-09-13 ENCOUNTER — Telehealth: Payer: Self-pay | Admitting: Internal Medicine

## 2022-09-13 NOTE — Telephone Encounter (Signed)
Checking percert on the following patient for testing scheduled at [facility].    CT CHEST WO CONTRAST  10-05-2022

## 2022-09-17 ENCOUNTER — Telehealth: Payer: Self-pay | Admitting: *Deleted

## 2022-09-17 NOTE — Telephone Encounter (Signed)
[facility]  TCS w/Dr.Carver asa 3

## 2022-09-26 ENCOUNTER — Other Ambulatory Visit: Payer: Medicare HMO

## 2022-10-03 ENCOUNTER — Ambulatory Visit (INDEPENDENT_AMBULATORY_CARE_PROVIDER_SITE_OTHER): Payer: Medicare HMO

## 2022-10-03 VITALS — Ht 63.0 in | Wt 117.0 lb

## 2022-10-03 DIAGNOSIS — Z0000 Encounter for general adult medical examination without abnormal findings: Secondary | ICD-10-CM

## 2022-10-03 NOTE — Progress Notes (Signed)
Subjective:   Tammy Proctor is a 67 y.o. female who presents for an Initial Medicare Annual Wellness Visit.  Visit Complete: Virtual  I connected with  Tammy Proctor on 10/03/22 by a audio enabled telemedicine application and verified that I am speaking with the correct person using two identifiers.  Patient Location: Home  Provider Location: Home Office  I discussed the limitations of evaluation and management by telemedicine. The patient expressed understanding and agreed to proceed.  Patient Medicare AWV questionnaire was completed by the patient on 10/03/2022; I have confirmed that all information answered by patient is correct and no changes since this date.  Review of Systems    Vital Signs: Unable to obtain new vitals due to this being a telehealth visit.  Cardiac Risk Factors include: advanced age (>75men, >58 women);smoking/ tobacco exposure;hypertension;dyslipidemia     Objective:    Today's Vitals   10/03/22 1301  Weight: 117 lb (53.1 kg)  Height: 5\' 3"  (1.6 m)   Body mass index is 20.73 kg/m.     10/03/2022    1:04 PM 06/15/2022    2:00 PM 06/15/2022    8:45 AM 04/02/2022    9:59 AM 03/03/2020    9:25 AM 01/25/2020    8:44 AM 06/30/2018    8:22 PM  Advanced Directives  Does Patient Have a Medical Advance Directive? No No No No No No No  Would patient like information on creating a medical advance directive? Yes (MAU/Ambulatory/Procedural Areas - Information given) No - Patient declined Yes (ED - Information included in AVS)  No - Patient declined Yes (MAU/Ambulatory/Procedural Areas - Information given) No - Patient declined    Current Medications (verified) Outpatient Encounter Medications as of 10/03/2022  Medication Sig   acetaminophen (TYLENOL) 325 MG tablet Take 2 tablets (650 mg total) by mouth every 6 (six) hours as needed for mild pain (or Fever >/= 101).   amLODipine (NORVASC) 5 MG tablet Take 1 tablet (5 mg total) by mouth daily.   aspirin EC 81  MG tablet Take 1 tablet (81 mg total) by mouth daily with breakfast. Swallow whole.   atorvastatin (LIPITOR) 40 MG tablet Take 1 tablet (40 mg total) by mouth daily.   fluticasone (FLONASE) 50 MCG/ACT nasal spray Place 2 sprays into both nostrils daily.   sertraline (ZOLOFT) 50 MG tablet Take 1 tablet (50 mg total) by mouth daily.   traZODone (DESYREL) 50 MG tablet Take 1 tablet (50 mg total) by mouth at bedtime.   No facility-administered encounter medications on file as of 10/03/2022.    Allergies (verified) Ceclor [cefaclor]   History: Past Medical History:  Diagnosis Date   Anxiety    Depression    HLD (hyperlipidemia)    Hypertension    Migraine    resolved   Past Surgical History:  Procedure Laterality Date   BIOPSY  01/25/2020   Procedure: BIOPSY;  Surgeon: Lanelle Bal, DO;  [facility];  Service: Endoscopy;;   BREAST EXCISIONAL BIOPSY Right    benign   CATARACT EXTRACTION Right    COLONOSCOPY WITH PROPOFOL N/A 01/25/2020   Procedure: COLONOSCOPY WITH PROPOFOL;  Surgeon: Lanelle Bal, DO;  [facility];  Service: Endoscopy;  Laterality: N/A;  9:00am   COLONOSCOPY WITH PROPOFOL N/A 03/03/2020   Procedure: COLONOSCOPY WITH PROPOFOL;  Surgeon: Lanelle Bal, DO;  [facility];  Service: Endoscopy;  Laterality: N/A;  10:15am   NASAL SINUS SURGERY  POLYPECTOMY  03/03/2020   Procedure: POLYPECTOMY;  Surgeon: Lanelle Bal, DO;  [facility];  Service: Endoscopy;;   TUBAL LIGATION     Family History  Problem Relation Age of Onset   Cancer Mother        breast   Diabetes Mother    Heart disease Father    Mental illness Father    Heart attack Father    Heart attack Sister    Heart disease Sister    Heart disease Sister    Diabetes Sister    Cancer Brother        prostate   Diabetes Brother    Colon cancer Neg Hx    Breast cancer Neg Hx    Social History   Socioeconomic History   Marital status:  Divorced    Spouse name: Not on file   Number of children: 3   Years of education: 14   Highest education level: Associate degree: academic program  Occupational History   Not on file  Tobacco Use   Smoking status: Every Day    Current packs/day: 0.50    Average packs/day: 0.5 packs/day for 52.0 years (26.0 ttl pk-yrs)    Types: Cigarettes    Start date: 08/12/1974   Smokeless tobacco: Never  Vaping Use   Vaping status: Never Used  Substance and Sexual Activity   Alcohol use: Yes    Alcohol/week: 6.0 standard drinks of alcohol    Types: 6 Cans of beer per week    Comment: occasional   Drug use: Yes    Types: Marijuana    Comment: once per month   Sexual activity: Yes    Birth control/protection: Surgical  Other Topics Concern   Not on file  Social History Narrative   Not on file   Social Determinants of Health   Financial Resource Strain: Low Risk  (10/03/2022)   Overall Financial Resource Strain (CARDIA)    Difficulty of Paying Living Expenses: Not hard at all  Food Insecurity: No Food Insecurity (10/03/2022)   Hunger Vital Sign    Worried About Running Out of Food in the Last Year: Never true    Ran Out of Food in the Last Year: Never true  Transportation Needs: No Transportation Needs (10/03/2022)   PRAPARE - Administrator, Civil Service (Medical): No    Lack of Transportation (Non-Medical): No  Physical Activity: Insufficiently Active (10/03/2022)   Exercise Vital Sign    Days of Exercise per Week: 3 days    Minutes of Exercise per Session: 30 min  Stress: No Stress Concern Present (10/03/2022)   [employer] of Occupational Health - Occupational Stress Questionnaire    Feeling of Stress : Not at all  Social Connections: Socially Isolated (10/03/2022)   Social Connection and Isolation Panel [NHANES]    Frequency of Communication with Friends and Family: More than three times a week    Frequency of Social Gatherings with Friends and Family: More than three  times a week    Attends Religious Services: Never    Database administrator or Organizations: No    Attends Engineer, structural: Never    Marital Status: Divorced    Tobacco Counseling Ready to quit: No Counseling given: Not Answered   Clinical Intake:  Pre-visit preparation completed: Yes  Pain : No/denies pain     Nutritional Risks: None Diabetes: No  How often do you need to have someone help you when you  read instructions, pamphlets, or other written materials from your doctor or pharmacy?: 1 - Never  Interpreter Needed?: No  Information entered by :: Renie Ora, LPN   Activities of Daily Living    10/03/2022    1:04 PM 06/15/2022    2:00 PM  In your present state of health, do you have any difficulty performing the following activities:  Hearing? 0 0  Vision? 0 0  Difficulty concentrating or making decisions? 0 0  Walking or climbing stairs? 0 0  Dressing or bathing? 0 0  Doing errands, shopping? 0   Preparing Food and eating ? N   Using the Toilet? N   In the past six months, have you accidently leaked urine? N   Do you have problems with loss of bowel control? N   Managing your Medications? N   Managing your Finances? N   Housekeeping or managing your Housekeeping? N     Patient Care Team: Gabriel Earing, FNP as PCP - General (Family Medicine) Wyline Mood Dorothe Pea, MD as PCP - Cardiology (Cardiology)  Indicate any recent Medical Services you may have received from other than [facility] providers in the past year (date may be approximate).     Assessment:   This is a routine wellness examination for [place].  Hearing/Vision screen Vision Screening - Comments:: Wears rx glasses - up to date with routine eye exams with  Dr.johnson   Dietary issues and exercise activities discussed:     Goals Addressed             This Visit's Progress    Exercise 3x per week (30 min per time)         Depression Screen    10/03/2022    1:02 PM  06/25/2022   11:11 AM 07/06/2013   10:35 AM  PHQ 2/9 Scores  PHQ - 2 Score 0 1 1  PHQ- 9 Score  5     Fall Risk    10/03/2022    1:01 PM 08/27/2022   11:44 AM 08/27/2022   11:34 AM 07/24/2022   12:51 PM 06/25/2022   11:11 AM  Fall Risk   Falls in the past year? 0 0 0 0 0  Number falls in past yr: 0 0 0    Injury with Fall? 0 0 0    Risk for fall due to : No Fall Risks  No Fall Risks    Follow up Falls prevention discussed  Education provided Falls evaluation completed     MEDICARE RISK AT HOME:  Medicare Risk at Home - 10/03/22 1301     Any stairs in or around the home? Yes    If so, are there any without handrails? No    Home free of loose throw rugs in walkways, pet beds, electrical cords, etc? Yes    Adequate lighting in your home to reduce risk of falls? Yes    Life alert? No    Use of a cane, walker or w/c? No    Grab bars in the bathroom? Yes    Shower chair or bench in shower? Yes    Elevated toilet seat or a handicapped toilet? Yes             TIMED UP AND GO:  Was the test performed? No    Cognitive Function:        10/03/2022    1:04 PM  6CIT Screen  What Year? 0 points  What month? 0 points  What  time? 0 points  Count back from 20 0 points  Months in reverse 0 points  Repeat phrase 0 points  Total Score 0 points    Immunizations Immunization History  Administered Date(s) Administered   Influenza,inj,Quad PF,6+ Mos 11/28/2017   Influenza,trivalent, recombinat, inj, PF 12/27/2016   Moderna Sars-Covid-2 Vaccination 06/09/2019, 07/07/2019   PNEUMOCOCCAL CONJUGATE-20 06/16/2022   Tdap 06/25/2022    TDAP status: Up to date  Flu Vaccine status: Up to date  Pneumococcal vaccine status: Up to date  Covid-19 vaccine status: Completed vaccines  Qualifies for Shingles Vaccine? Yes   Zostavax completed No   Shingrix Completed?: No.    Education has been provided regarding the importance of this vaccine. Patient has been advised to call insurance  company to determine out of pocket expense if they have not yet received this vaccine. Advised may also receive vaccine at local pharmacy or Health Dept. Verbalized acceptance and understanding.  Screening Tests Health Maintenance  Topic Date Due   DEXA SCAN  Never done   INFLUENZA VACCINE  09/27/2022   COVID-19 Vaccine (3 - 2023-24 season) 07/11/2023 (Originally 10/27/2021)   Zoster Vaccines- Shingrix (1 of 2) 09/24/2023 (Originally 08/12/2006)   Lung Cancer Screening  06/15/2023   Medicare Annual Wellness (AWV)  10/03/2023   MAMMOGRAM  09/02/2024   Colonoscopy  03/03/2030   DTaP/Tdap/Td (2 - Td or Tdap) 06/24/2032   Pneumonia Vaccine 48+ Years old  Completed   Hepatitis C Screening  Completed   HPV VACCINES  Aged Out    Health Maintenance  Health Maintenance Due  Topic Date Due   DEXA SCAN  Never done   INFLUENZA VACCINE  09/27/2022    Colorectal cancer screening: Type of screening: Colonoscopy. Completed 03/03/2020. Repeat every 10 years  Mammogram status: Completed 09/03/2022. Repeat every year  Bone Density status: Ordered not of age . Pt provided with contact info and advised to call to schedule appt.  Lung Cancer Screening: (Low Dose CT Chest recommended if Age 80-80 years, 20 pack-year currently smoking OR have quit w/in 15years.) does qualify.   Lung Cancer Screening Referral: scheduled 10/05/2022  Additional Screening:  Hepatitis C Screening: does not qualify; Completed 06/25/2022  Vision Screening: Recommended annual ophthalmology exams for early detection of glaucoma and other disorders of the eye. Is the patient up to date with their annual eye exam?  Yes  Who is the provider or what is the name of the office in which the patient attends annual eye exams? Dr.Johnson  If pt is not established with a provider, would they like to be referred to a provider to establish care? No .   Dental Screening: Recommended annual dental exams for proper oral  hygiene   Community Resource Referral / Chronic Care Management: CRR required this visit?  No   CCM required this visit?  No     Plan:     I have personally reviewed and noted the following in the patient's chart:   Medical and social history Use of alcohol, tobacco or illicit drugs  Current medications and supplements including opioid prescriptions. Patient is not currently taking opioid prescriptions. Functional ability and status Nutritional status Physical activity Advanced directives List of other physicians Hospitalizations, surgeries, and ER visits in previous 12 months Vitals Screenings to include cognitive, depression, and falls Referrals and appointments  In addition, I have reviewed and discussed with patient certain preventive protocols, quality metrics, and best practice recommendations. A written personalized care plan for preventive services as well  as general preventive health recommendations were provided to patient.     Lorrene Reid, LPN   09/26/1911   After Visit Summary: (MyChart) Due to this being a telephonic visit, the after visit summary with patients personalized plan was offered to patient via MyChart   Nurse Notes: none

## 2022-10-03 NOTE — Patient Instructions (Signed)
Tammy Proctor , Thank you for taking time to come for your Medicare Wellness Visit. I appreciate your ongoing commitment to your health goals. Please review the following plan we discussed and let me know if I can assist you in the future.   Referrals/Orders/Follow-Ups/Clinician Recommendations: Aim for 30 minutes of exercise or brisk walking, 6-8 glasses of water, and 5 servings of fruits and vegetables each day.   This is a list of the screening recommended for you and due dates:  Health Maintenance  Topic Date Due   DEXA scan (bone density measurement)  Never done   Flu Shot  09/27/2022   COVID-19 Vaccine (3 - 2023-24 season) 07/11/2023*   Zoster (Shingles) Vaccine (1 of 2) 09/24/2023*   Screening for Lung Cancer  06/15/2023   Medicare Annual Wellness Visit  10/03/2023   Mammogram  09/02/2024   Colon Cancer Screening  03/03/2030   DTaP/Tdap/Td vaccine (2 - Td or Tdap) 06/24/2032   Pneumonia Vaccine  Completed   Hepatitis C Screening  Completed   HPV Vaccine  Aged Out  *Topic was postponed. The date shown is not the original due date.    Advanced directives: (ACP Link)Information on Advanced Care Planning can be found at [facility] of [place] Advance Health Care Directives Advance Health Care Directives ([email or web address] Information on Advanced Care Planning can be found at [facility] of [facility] Advance Health Care Directives Advance Health Care Directives ([email or web address]    Next Medicare Annual Wellness Visit scheduled for next year: Yes  Preventive Care 65 Years and Older, Female Preventive care refers to lifestyle choices and visits with your health care provider that can promote health and wellness. What does preventive care include? A yearly physical exam. This is also called an annual well check. Dental exams once or twice a year. Routine eye exams. Ask your health care provider how often you should have your eyes checked. Personal lifestyle choices,  including: Daily care of your teeth and gums. Regular physical activity. Eating a healthy diet. Avoiding tobacco and drug use. Limiting alcohol use. Practicing safe sex. Taking low-dose aspirin every day. Taking vitamin and mineral supplements as recommended by your health care provider. What happens during an annual well check? The services and screenings done by your health care provider during your annual well check will depend on your age, overall health, lifestyle risk factors, and family history of disease. Counseling  Your health care provider may ask you questions about your: Alcohol use. Tobacco use. Drug use. Emotional well-being. Home and relationship well-being. Sexual activity. Eating habits. History of falls. Memory and ability to understand (cognition). Work and work Astronomer. Reproductive health. Screening  You may have the following tests or measurements: Height, weight, and BMI. Blood pressure. Lipid and cholesterol levels. These may be checked every 5 years, or more frequently if you are over 70 years old. Skin check. Lung cancer screening. You may have this screening every year starting at age 50 if you have a 30-pack-year history of smoking and currently smoke or have quit within the past 15 years. Fecal occult blood test (FOBT) of the stool. You may have this test every year starting at age 76. Flexible sigmoidoscopy or colonoscopy. You may have a sigmoidoscopy every 5 years or a colonoscopy every 10 years starting at age 87. Hepatitis C blood test. Hepatitis B blood test. Sexually transmitted disease (STD) testing. Diabetes screening. This is done by checking your blood sugar (glucose) after you have not eaten for a while (  fasting). You may have this done every 1-3 years. Bone density scan. This is done to screen for osteoporosis. You may have this done starting at age 50. Mammogram. This may be done every 1-2 years. Talk to your health care provider  about how often you should have regular mammograms. Talk with your health care provider about your test results, treatment options, and if necessary, the need for more tests. Vaccines  Your health care provider may recommend certain vaccines, such as: Influenza vaccine. This is recommended every year. Tetanus, diphtheria, and acellular pertussis (Tdap, Td) vaccine. You may need a Td booster every 10 years. Zoster vaccine. You may need this after age 62. Pneumococcal 13-valent conjugate (PCV13) vaccine. One dose is recommended after age 31. Pneumococcal polysaccharide (PPSV23) vaccine. One dose is recommended after age 74. Talk to your health care provider about which screenings and vaccines you need and how often you need them. This information is not intended to replace advice given to you by your health care provider. Make sure you discuss any questions you have with your health care provider. Document Released: 03/11/2015 Document Revised: 11/02/2015 Document Reviewed: 12/14/2014 Elsevier Interactive Patient Education  2017 [employer].  Fall Prevention in the Home Falls can cause injuries. They can happen to people of all ages. There are many things you can do to make your home safe and to help prevent falls. What can I do on the outside of my home? Regularly fix the edges of walkways and driveways and fix any cracks. Remove anything that might make you trip as you walk through a door, such as a raised step or threshold. Trim any bushes or trees on the path to your home. Use bright outdoor lighting. Clear any walking paths of anything that might make someone trip, such as rocks or tools. Regularly check to see if handrails are loose or broken. Make sure that both sides of any steps have handrails. Any raised decks and porches should have guardrails on the edges. Have any leaves, snow, or ice cleared regularly. Use sand or salt on walking paths during winter. Clean up any spills in  your garage right away. This includes oil or grease spills. What can I do in the bathroom? Use night lights. Install grab bars by the toilet and in the tub and shower. Do not use towel bars as grab bars. Use non-skid mats or decals in the tub or shower. If you need to sit down in the shower, use a plastic, non-slip stool. Keep the floor dry. Clean up any water that spills on the floor as soon as it happens. Remove soap buildup in the tub or shower regularly. Attach bath mats securely with double-sided non-slip rug tape. Do not have throw rugs and other things on the floor that can make you trip. What can I do in the bedroom? Use night lights. Make sure that you have a light by your bed that is easy to reach. Do not use any sheets or blankets that are too big for your bed. They should not hang down onto the floor. Have a firm chair that has side arms. You can use this for support while you get dressed. Do not have throw rugs and other things on the floor that can make you trip. What can I do in the kitchen? Clean up any spills right away. Avoid walking on wet floors. Keep items that you use a lot in easy-to-reach places. If you need to reach something above you, use  a strong step stool that has a grab bar. Keep electrical cords out of the way. Do not use floor polish or wax that makes floors slippery. If you must use wax, use non-skid floor wax. Do not have throw rugs and other things on the floor that can make you trip. What can I do with my stairs? Do not leave any items on the stairs. Make sure that there are handrails on both sides of the stairs and use them. Fix handrails that are broken or loose. Make sure that handrails are as long as the stairways. Check any carpeting to make sure that it is firmly attached to the stairs. Fix any carpet that is loose or worn. Avoid having throw rugs at the top or bottom of the stairs. If you do have throw rugs, attach them to the floor with carpet  tape. Make sure that you have a light switch at the top of the stairs and the bottom of the stairs. If you do not have them, ask someone to add them for you. What else can I do to help prevent falls? Wear shoes that: Do not have high heels. Have rubber bottoms. Are comfortable and fit you well. Are closed at the toe. Do not wear sandals. If you use a stepladder: Make sure that it is fully opened. Do not climb a closed stepladder. Make sure that both sides of the stepladder are locked into place. Ask someone to hold it for you, if possible. Clearly mark and make sure that you can see: Any grab bars or handrails. First and last steps. Where the edge of each step is. Use tools that help you move around (mobility aids) if they are needed. These include: Canes. Walkers. Scooters. Crutches. Turn on the lights when you go into a dark area. Replace any light bulbs as soon as they burn out. Set up your furniture so you have a clear path. Avoid moving your furniture around. If any of your floors are uneven, fix them. If there are any pets around you, be aware of where they are. Review your medicines with your doctor. Some medicines can make you feel dizzy. This can increase your chance of falling. Ask your doctor what other things that you can do to help prevent falls. This information is not intended to replace advice given to you by your health care provider. Make sure you discuss any questions you have with your health care provider. Document Released: 12/09/2008 Document Revised: 07/21/2015 Document Reviewed: 03/19/2014 Elsevier Interactive Patient Education  2017 [employer].

## 2022-10-04 ENCOUNTER — Other Ambulatory Visit: Payer: Medicare HMO

## 2022-10-05 ENCOUNTER — Encounter: Payer: Self-pay | Admitting: *Deleted

## 2022-10-05 ENCOUNTER — Ambulatory Visit (HOSPITAL_COMMUNITY): Admission: RE | Admit: 2022-10-05 | Payer: Medicare HMO | Source: Ambulatory Visit

## 2022-10-05 ENCOUNTER — Other Ambulatory Visit: Payer: Self-pay | Admitting: *Deleted

## 2022-10-05 MED ORDER — NA SULFATE-K SULFATE-MG SULF 17.5-3.13-1.6 GM/177ML PO SOLN: ORAL | 0 refills | Status: DC

## 2022-10-05 NOTE — Telephone Encounter (Signed)
Pt has been scheduled for 10/31/22. Instructions sent via MyChart and prep sent to pharmacy.

## 2022-10-08 ENCOUNTER — Encounter: Payer: Self-pay | Admitting: *Deleted

## 2022-10-09 ENCOUNTER — Encounter (HOSPITAL_COMMUNITY)
Admission: RE | Admit: 2022-10-09 | Discharge: 2022-10-09 | Disposition: A | Discharge status: Home / Self Care | Payer: Medicare HMO | Source: Ambulatory Visit | Attending: Ophthalmology | Admitting: Ophthalmology

## 2022-10-11 NOTE — H&P (Signed)
Surgical History & Physical  Patient Name: Tammy Proctor  DOB: February 23, 1956  Surgery: Cataract extraction with intraocular lens implant phacoemulsification; Left Eye Surgeon: [name] MD Surgery Date: 10/12/2022 Pre-Op Date: 09/24/2022  HPI: A 74 Yr. old female patient 1. The patient was referred to [place] from Dr. Laural Benes for a cataract evaluation. The patient's vision is blurry. Especially in the left eye. The condition's severity is constant. The complaint is associated with difficulty writing checks/filling out forms, difficulty seeing at night and difficulty with glare on bright sunny days. This is negatively affecting the patient's quality of life and the patient is unable to function adequately in life with the current level of vision. HPI Completed by Dr. [name]  Medical History: Cataracts  Diabetes High Blood Pressure High cholesterol  Review of Systems Negative Allergic/Immunologic Hypertensive Cardiovascular Negative Constitutional Negative Ear, Nose, Mouth & Throat Diabetes Endocrine Cataracts Eyes Negative Gastrointestinal Negative Genitourinary Negative Hemotologic/Lymphatic Negative Integumentary Negative Musculoskeletal Negative Neurological Negative Psychiatry Negative Respiratory  Social Smoker, current status unknown   Medication atorvastatin ,  amlodipine ,  sertraline ,  aspirin ,  trazodone ,  amoxicillin-pot clavulanate   Sx/Procedures Phaco c IOL OD,   Drug Allergies  ceclor    History & Physical: Heent: cataracts NECK: supple without bruits LUNGS: lungs clear to auscultation CV: regular rate and rhythm Abdomen: soft and non-tender  Impression & Plan: Assessment: 1.  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  BLEPHARITIS; Right Upper Lid, Right Lower Lid, Left Upper Lid, Left Lower Lid (H01.001, H01.002,H01.004,H01.005) 3.  INTRAOCULAR LENS IOL (Z96.1)  Plan: 1.  Cataract accounts for the patient's  decreased vision. This visual impairment is not correctable with a tolerable change in glasses or contact lenses. Cataract surgery with an implantation of a new lens should significantly improve the visual and functional status of the patient. Discussed all risks, benefits, alternatives, and potential complications. Discussed the procedures and recovery. Patient desires to have surgery. A-scan ordered and performed today for intra-ocular lens calculations. The surgery will be performed in order to improve vision for driving, reading, and for eye examinations. Recommend phacoemulsification with intra-ocular lens. Recommend Dextenza for post-operative pain and inflammation. Left Eye. Dilates poorly - shugacaine by protocol. Vision [employer]. Malyugin Ring. Omidira.  2.  Blepharitis is present - recommend regular lid cleaning.  3.  Stable. Doing well since surgery

## 2022-10-12 ENCOUNTER — Ambulatory Visit (HOSPITAL_COMMUNITY): Payer: Medicare HMO | Admitting: Anesthesiology

## 2022-10-12 ENCOUNTER — Encounter (HOSPITAL_COMMUNITY): Payer: Self-pay | Admitting: Ophthalmology

## 2022-10-12 ENCOUNTER — Encounter (HOSPITAL_COMMUNITY)
Admission: RE | Disposition: A | Discharge status: Home / Self Care | Payer: Self-pay | Source: Home / Self Care | Attending: Ophthalmology

## 2022-10-12 ENCOUNTER — Ambulatory Visit (HOSPITAL_COMMUNITY)
Admission: RE | Admit: 2022-10-12 | Discharge: 2022-10-12 | Disposition: A | Discharge status: Home / Self Care | Payer: Medicare HMO | Attending: Ophthalmology | Admitting: Ophthalmology

## 2022-10-12 DIAGNOSIS — H2522 Age-related cataract, morgagnian type, left eye: Secondary | ICD-10-CM | POA: Diagnosis present

## 2022-10-12 DIAGNOSIS — Z961 Presence of intraocular lens: Secondary | ICD-10-CM | POA: Diagnosis not present

## 2022-10-12 DIAGNOSIS — H0100B Unspecified blepharitis left eye, upper and lower eyelids: Secondary | ICD-10-CM | POA: Insufficient documentation

## 2022-10-12 DIAGNOSIS — H0100A Unspecified blepharitis right eye, upper and lower eyelids: Secondary | ICD-10-CM | POA: Insufficient documentation

## 2022-10-12 DIAGNOSIS — F17200 Nicotine dependence, unspecified, uncomplicated: Secondary | ICD-10-CM | POA: Diagnosis not present

## 2022-10-12 DIAGNOSIS — H2181 Floppy iris syndrome: Secondary | ICD-10-CM | POA: Insufficient documentation

## 2022-10-12 HISTORY — PX: CATARACT EXTRACTION W/PHACO: SHX586

## 2022-10-12 SURGERY — PHACOEMULSIFICATION, CATARACT, WITH IOL INSERTION
Anesthesia: Monitor Anesthesia Care | Site: Eye | Laterality: Left

## 2022-10-12 MED ORDER — SODIUM HYALURONATE 23MG/ML IO SOSY
PREFILLED_SYRINGE | INTRAOCULAR | Status: DC | PRN
  Administered 2022-10-12: .6 mL via INTRAOCULAR

## 2022-10-12 MED ORDER — MIDAZOLAM HCL 2 MG/2ML IJ SOLN
INTRAMUSCULAR | Status: DC | PRN
  Administered 2022-10-12: 2 mg via INTRAVENOUS

## 2022-10-12 MED ORDER — NEOMYCIN-POLYMYXIN-DEXAMETH 3.5-10000-0.1 OP SUSP
OPHTHALMIC | Status: DC | PRN
  Administered 2022-10-12: 2 [drp] via OPHTHALMIC

## 2022-10-12 MED ORDER — POVIDONE-IODINE 5 % OP SOLN
OPHTHALMIC | Status: DC | PRN
  Administered 2022-10-12: 1 via OPHTHALMIC

## 2022-10-12 MED ORDER — PHENYLEPHRINE-KETOROLAC 1-0.3 % IO SOLN
INTRAOCULAR | Status: DC | PRN
  Administered 2022-10-12: 500 mL via OPHTHALMIC

## 2022-10-12 MED ORDER — TETRACAINE HCL 0.5 % OP SOLN
1.0000 [drp] | OPHTHALMIC | Status: AC | PRN
  Administered 2022-10-12 (×3): 1 [drp] via OPHTHALMIC

## 2022-10-12 MED ORDER — LIDOCAINE HCL (PF) 1 % IJ SOLN
INTRAOCULAR | Status: DC | PRN
  Administered 2022-10-12: 1 mL via OPHTHALMIC

## 2022-10-12 MED ORDER — PHENYLEPHRINE HCL 2.5 % OP SOLN
1.0000 [drp] | OPHTHALMIC | Status: AC | PRN
  Administered 2022-10-12 (×3): 1 [drp] via OPHTHALMIC

## 2022-10-12 MED ORDER — TRYPAN BLUE 0.06 % IO SOSY
PREFILLED_SYRINGE | INTRAOCULAR | Status: DC | PRN
Start: 2022-10-12 — End: 2022-10-12
  Administered 2022-10-12: .5 mL via INTRAOCULAR

## 2022-10-12 MED ORDER — MIDAZOLAM HCL 2 MG/2ML IJ SOLN
INTRAMUSCULAR | Status: AC
  Filled 2022-10-12: qty 2

## 2022-10-12 MED ORDER — TROPICAMIDE 1 % OP SOLN
1.0000 [drp] | OPHTHALMIC | Status: AC | PRN
  Administered 2022-10-12 (×3): 1 [drp] via OPHTHALMIC

## 2022-10-12 MED ORDER — STERILE WATER FOR IRRIGATION IR SOLN
Status: DC | PRN
  Administered 2022-10-12: 1000 mL

## 2022-10-12 MED ORDER — SODIUM HYALURONATE 10 MG/ML IO SOLUTION
PREFILLED_SYRINGE | INTRAOCULAR | Status: DC | PRN
  Administered 2022-10-12: .85 mL via INTRAOCULAR

## 2022-10-12 MED ORDER — BSS IO SOLN
INTRAOCULAR | Status: DC | PRN
  Administered 2022-10-12: 15 mL via INTRAOCULAR

## 2022-10-12 MED ORDER — LIDOCAINE HCL 3.5 % OP GEL
1.0000 | Freq: Once | OPHTHALMIC | Status: AC
  Administered 2022-10-12: 1 via OPHTHALMIC

## 2022-10-12 SURGICAL SUPPLY — 14 items
CATARACT SUITE SIGHTPATH (MISCELLANEOUS) ×1
CLOTH BEACON ORANGE TIMEOUT ST (SAFETY) ×1 IMPLANT
EYE SHIELD UNIVERSAL CLEAR (GAUZE/BANDAGES/DRESSINGS) IMPLANT
FEE CATARACT SUITE SIGHTPATH (MISCELLANEOUS) ×1 IMPLANT
GLOVE BIOGEL PI IND STRL 7.0 (GLOVE) ×2 IMPLANT
LENS IOL TECNIS EYHANCE 21.0 (Intraocular Lens) IMPLANT
NDL HYPO 18GX1.5 BLUNT FILL (NEEDLE) ×1 IMPLANT
NEEDLE HYPO 18GX1.5 BLUNT FILL (NEEDLE) ×1
PAD ARMBOARD 7.5X6 YLW CONV (MISCELLANEOUS) ×1 IMPLANT
POSITIONER HEAD 8X9X4 ADT (SOFTGOODS) ×1 IMPLANT
RING MALYGIN 7.0 (MISCELLANEOUS) IMPLANT
SYR TB 1ML LL NO SAFETY (SYRINGE) ×1 IMPLANT
TAPE SURG TRANSPORE 1 IN (GAUZE/BANDAGES/DRESSINGS) IMPLANT
WATER STERILE IRR 250ML POUR (IV SOLUTION) ×1 IMPLANT

## 2022-10-12 NOTE — Discharge Instructions (Signed)
Please discharge patient when stable, will follow up today with Dr. Wrzosek at the [facility] office immediately following discharge.  Leave shield in place until visit.  All paperwork with discharge instructions will be given at the office.  [facility] Address:  [address]  [place], [place] [address]  

## 2022-10-12 NOTE — Interval H&P Note (Signed)
History and Physical Interval Note:  10/12/2022 10:43 AM  Tammy Proctor  has presented today for surgery, with the diagnosis of hypermature morgagnian cataract, left eye.  The various methods of treatment have been discussed with the patient and family. After consideration of risks, benefits and other options for treatment, the patient has consented to  Procedure(s) with comments: CATARACT EXTRACTION PHACO AND INTRAOCULAR LENS PLACEMENT (IOC) (Left) - Complex case as a surgical intervention.  The patient's history has been reviewed, patient examined, no change in status, stable for surgery.  I have reviewed the patient's chart and labs.  Questions were answered to the patient's satisfaction.     [name]

## 2022-10-12 NOTE — Anesthesia Postprocedure Evaluation (Signed)
Anesthesia Post Note  Patient: Tammy Proctor  Procedure(s) Performed: CATARACT EXTRACTION PHACO AND INTRAOCULAR LENS PLACEMENT (IOC) (Left: Eye)  Patient location during evaluation: Phase II Anesthesia Type: MAC Level of consciousness: awake and alert and oriented Pain management: pain level controlled Vital Signs Assessment: post-procedure vital signs reviewed and stable Respiratory status: spontaneous breathing, nonlabored ventilation and respiratory function stable Cardiovascular status: stable and blood pressure returned to baseline Postop Assessment: no apparent nausea or vomiting Anesthetic complications: no  No notable events documented.   Last Vitals:  Vitals:   10/12/22 1030 10/12/22 1121  BP: (!) 148/83 130/80  Pulse: 67 69  Resp: 16 16  Temp: 36.5 C 36.6 C  SpO2: 99% 99%    Last Pain:  Vitals:   10/12/22 1121  TempSrc: Oral  PainSc: 0-No pain                 Diana Davenport C Safaa Stingley

## 2022-10-12 NOTE — Anesthesia Preprocedure Evaluation (Signed)
Anesthesia Evaluation  Patient identified by MRN, date of birth, ID band Patient awake    Reviewed: Allergy & Precautions, H&P , NPO status , Patient's Chart, lab work & pertinent test results  Airway Mallampati: II  TM Distance: >3 FB Neck ROM: Full    Dental  (+) Dental Advisory Given No notable dental injury :   Pulmonary Current Smoker and Patient abstained from smoking. Pulmonary nodules   Pulmonary exam normal breath sounds clear to auscultation       Cardiovascular hypertension, Pt. on medications Normal cardiovascular exam Rhythm:Regular Rate:Normal     Neuro/Psych  Headaches PSYCHIATRIC DISORDERS Anxiety Depression       GI/Hepatic negative GI ROS,,,(+)     substance abuse  marijuana use  Endo/Other  negative endocrine ROS    Renal/GU negative Renal ROS  negative genitourinary   Musculoskeletal negative musculoskeletal ROS (+)    Abdominal   Peds negative pediatric ROS (+)  Hematology negative hematology ROS (+)   Anesthesia Other Findings   Reproductive/Obstetrics negative OB ROS                             Anesthesia Physical Anesthesia Plan  ASA: 2  Anesthesia Plan: MAC   Post-op Pain Management:    Induction: Intravenous  PONV Risk Score and Plan: 0 and Treatment may vary due to age or medical condition  Airway Management Planned:   Additional Equipment:   Intra-op Plan:   Post-operative Plan:   Informed Consent: I have reviewed the patients History and Physical, chart, labs and discussed the procedure including the risks, benefits and alternatives for the proposed anesthesia with the patient or authorized representative who has indicated his/her understanding and acceptance.     Dental advisory given  Plan Discussed with: CRNA and Surgeon  Anesthesia Plan Comments:         Anesthesia Quick Evaluation

## 2022-10-12 NOTE — Transfer of Care (Signed)
Immediate Anesthesia Transfer of Care Note  Patient: Tammy Proctor  Procedure(s) Performed: CATARACT EXTRACTION PHACO AND INTRAOCULAR LENS PLACEMENT (IOC) (Left: Eye)  Patient Location: Short Stay  Anesthesia Type:MAC  Level of Consciousness: awake, alert , and oriented  Airway & Oxygen Therapy: Patient Spontanous Breathing  Post-op Assessment: Report given to RN and Post -op Vital signs reviewed and stable  Post vital signs: Reviewed and stable  Last Vitals:  Vitals Value Taken Time  BP 130/80 10/12/22 1121  Temp 36.6 C 10/12/22 1121  Pulse 69 10/12/22 1121  Resp 16 10/12/22 1121  SpO2 99 % 10/12/22 1121    Last Pain:  Vitals:   10/12/22 1121  TempSrc: Oral  PainSc: 0-No pain         Complications: No notable events documented.

## 2022-10-12 NOTE — Op Note (Signed)
Date of procedure: 10/12/22  Pre-operative diagnosis: Visually significant mature cataract, Left Eye; Poor dilation, Left eye (H25.22)   Post-operative diagnosis: Visually significant mature  cataract, Left Eye; Intra-operative Floppy Iris Syndrome, Left Eye (H21.81)  Procedure: Complex removal of cataract via phacoemulsification and insertion of intra-ocular lens [employer] DIB00 +21.0D into the capsular bag of the Left Eye (CPT [phone])  Attending surgeon: Rudy Jew. Finn Altemose, MD, MA  Anesthesia: MAC, Topical Akten  Complications: None  Estimated Blood Loss: <51mL (minimal)  Specimens: None  Implants: As above  Indications:  Visually significant cataract, Left Eye  Procedure:  The patient was seen and identified in the pre-operative area. The operative eye was identified and dilated.  The operative eye was marked.  Topical anesthesia was administered to the operative eye.     The patient was then to the operative suite and placed in the supine position.  A timeout was performed confirming the patient, procedure to be performed, and all other relevant information.   The patient's face was prepped and draped in the usual fashion for intra-ocular surgery.  A lid speculum was placed into the operative eye and the surgical microscope moved into place and focused.  Poor dilation of the iris was confirmed.  An inferotemporal paracentesis was created using a 20 gauge paracentesis blade. Vision Blue was used to stain the anterior capsule.  The Vision Blue was irrigated out of the eye with Shugarcaine.  Viscoelastic was injected into the anterior chamber.  A temporal clear-corneal main wound incision was created using a 2.35mm microkeratome.  A 7.65mm Malyugin ring was placed.  A continuous curvilinear capsulorrhexis was initiated using an irrigating cystitome and completed using capsulorrhexis forceps.  Hydrodissection and hydrodeliniation were performed.  Viscoelastic was injected into the  anterior chamber.  A phacoemulsification handpiece and a chopper as a second instrument were used to remove the nucleus and epinucleus. The irrigation/aspiration handpiece was used to remove any remaining cortical material.   The capsular bag was reinflated with viscoelastic, checked, and found to be intact.  The intraocular lens was inserted into the capsular bag and dialed into place using a Customer service manager. The Malyugin ring was removed.  The irrigation/aspiration handpiece was used to remove any remaining viscoelastic.  The clear corneal wound and paracentesis wounds were then hydrated and checked with Weck-Cels to be watertight. Maxitrol drops were instilled into the operative eye. The lid-speculum and drape was removed, and the patient's face was cleaned with a wet and dry 4x4. A clear shield was taped over the eye. The patient was taken to the post-operative care unit in good condition, having tolerated the procedure well.  Post-Op Instructions: The patient will follow up at [facility] for a same day post-operative evaluation and will receive all other orders and instructions.

## 2022-10-15 ENCOUNTER — Encounter (HOSPITAL_COMMUNITY): Payer: Self-pay | Admitting: Ophthalmology

## 2022-10-17 ENCOUNTER — Ambulatory Visit (HOSPITAL_COMMUNITY)
Admission: RE | Admit: 2022-10-17 | Discharge: 2022-10-17 | Disposition: A | Discharge status: Home / Self Care | Payer: Medicare HMO | Source: Ambulatory Visit | Attending: Nurse Practitioner | Admitting: Nurse Practitioner

## 2022-10-17 DIAGNOSIS — R918 Other nonspecific abnormal finding of lung field: Secondary | ICD-10-CM | POA: Diagnosis present

## 2022-10-23 ENCOUNTER — Ambulatory Visit
Admission: RE | Admit: 2022-10-23 | Discharge: 2022-10-23 | Disposition: A | Discharge status: Home / Self Care | Payer: Medicare HMO | Source: Ambulatory Visit | Attending: Family Medicine | Admitting: Family Medicine

## 2022-10-23 ENCOUNTER — Ambulatory Visit: Payer: Medicare HMO

## 2022-10-23 DIAGNOSIS — R928 Other abnormal and inconclusive findings on diagnostic imaging of breast: Secondary | ICD-10-CM

## 2022-10-26 ENCOUNTER — Encounter (HOSPITAL_COMMUNITY)
Admission: RE | Admit: 2022-10-26 | Discharge: 2022-10-26 | Disposition: A | Discharge status: Home / Self Care | Payer: Medicare HMO | Source: Ambulatory Visit | Attending: Internal Medicine | Admitting: Internal Medicine

## 2022-10-30 ENCOUNTER — Encounter (HOSPITAL_COMMUNITY): Payer: Self-pay | Admitting: Anesthesiology

## 2022-10-30 ENCOUNTER — Telehealth: Payer: Self-pay | Admitting: *Deleted

## 2022-10-30 NOTE — Telephone Encounter (Signed)
Pt is cancelling her procedure for tomorrow. She says son tested positive for Covid yesterday and she is feeling well. Advised pt will call her once we get providers schedule for October.  FYI

## 2022-10-30 NOTE — Telephone Encounter (Signed)
Noted  

## 2022-10-31 ENCOUNTER — Ambulatory Visit (HOSPITAL_COMMUNITY): Admission: RE | Admit: 2022-10-31 | Payer: Medicare HMO | Source: Home / Self Care

## 2022-10-31 ENCOUNTER — Encounter (HOSPITAL_COMMUNITY): Admission: RE | Payer: Self-pay | Source: Home / Self Care

## 2022-10-31 SURGERY — COLONOSCOPY WITH PROPOFOL
Anesthesia: Monitor Anesthesia Care

## 2022-11-12 NOTE — Telephone Encounter (Signed)
Attempted to call pt. Unable to leave message, mailbox is full.

## 2022-12-04 ENCOUNTER — Encounter: Payer: Self-pay | Admitting: *Deleted

## 2022-12-04 ENCOUNTER — Other Ambulatory Visit: Payer: Self-pay | Admitting: *Deleted

## 2022-12-04 MED ORDER — NA SULFATE-K SULFATE-MG SULF 17.5-3.13-1.6 GM/177ML PO SOLN: ORAL | 0 refills | Status: DC

## 2022-12-04 NOTE — Telephone Encounter (Signed)
Pt has been rescheduled for 01/21/23. Updated instructions sent and resent in prep to pharmacy.

## 2022-12-28 ENCOUNTER — Ambulatory Visit (INDEPENDENT_AMBULATORY_CARE_PROVIDER_SITE_OTHER): Payer: Medicare HMO | Admitting: Family Medicine

## 2022-12-28 ENCOUNTER — Encounter: Payer: Self-pay | Admitting: Family Medicine

## 2022-12-28 VITALS — BP 111/67 | HR 75 | Temp 98.3°F | Ht 63.0 in | Wt 107.4 lb

## 2022-12-28 DIAGNOSIS — E782 Mixed hyperlipidemia: Secondary | ICD-10-CM

## 2022-12-28 DIAGNOSIS — F419 Anxiety disorder, unspecified: Secondary | ICD-10-CM

## 2022-12-28 DIAGNOSIS — Z23 Encounter for immunization: Secondary | ICD-10-CM

## 2022-12-28 DIAGNOSIS — I10 Essential (primary) hypertension: Secondary | ICD-10-CM

## 2022-12-28 DIAGNOSIS — Z0001 Encounter for general adult medical examination with abnormal findings: Secondary | ICD-10-CM | POA: Diagnosis not present

## 2022-12-28 DIAGNOSIS — F4321 Adjustment disorder with depressed mood: Secondary | ICD-10-CM

## 2022-12-28 DIAGNOSIS — Z0000 Encounter for general adult medical examination without abnormal findings: Secondary | ICD-10-CM

## 2022-12-28 MED ORDER — SERTRALINE HCL 50 MG PO TABS
50.0000 mg | ORAL_TABLET | Freq: Every day | ORAL | 1 refills | Status: DC
Start: 2022-12-28 — End: 2023-04-26

## 2022-12-28 MED ORDER — FLUTICASONE PROPIONATE 50 MCG/ACT NA SUSP
2.0000 | Freq: Every day | NASAL | 0 refills | Status: DC
Start: 2022-12-28 — End: 2023-01-29

## 2022-12-28 MED ORDER — AMLODIPINE BESYLATE 5 MG PO TABS
5.0000 mg | ORAL_TABLET | Freq: Every day | ORAL | 1 refills | Status: DC
Start: 2022-12-28 — End: 2023-04-26

## 2022-12-28 MED ORDER — ATORVASTATIN CALCIUM 40 MG PO TABS
40.0000 mg | ORAL_TABLET | Freq: Every day | ORAL | 1 refills | Status: DC
Start: 2022-12-28 — End: 2023-08-05

## 2022-12-28 NOTE — Patient Instructions (Signed)

## 2022-12-28 NOTE — Progress Notes (Signed)
Complete physical exam  Patient: Tammy Proctor   DOB: 12/01/1955   67 y.o. Female  MRN: [id number]  Subjective:    Chief Complaint  Patient presents with   Annual Exam    Tammy Proctor is a 67 y.o. female who presents today for a complete physical exam. She reports consuming a general diet. She walks daily in the morning and evening. She generally feels well. She reports sleeping fairly well. She has to wake up 2x a night to void and then struggles to go back to sleep. She has taken trazodone but this leaves her groggy the following day.  She does not have additional problems to discuss today.       12/28/2022    2:52 PM 10/03/2022    1:02 PM 06/25/2022   11:11 AM  Depression screen PHQ 2/9  Decreased Interest 0 0 0  Down, Depressed, Hopeless 0 0 1  PHQ - 2 Score 0 0 1  Altered sleeping 0  1  Tired, decreased energy 0  2  Change in appetite 0  1  Feeling bad or failure about yourself  0  0  Trouble concentrating 0  0  Moving slowly or fidgety/restless 0  0  Suicidal thoughts 0  0  PHQ-9 Score 0  5  Difficult doing work/chores Not difficult at all  Not difficult at all      12/28/2022    2:53 PM 06/25/2022   11:11 AM  GAD 7 : Generalized Anxiety Score  Nervous, Anxious, on Edge 0 1  Control/stop worrying 0 0  Worry too much - different things 0 0  Trouble relaxing 0 0  Restless 0 0  Easily annoyed or irritable 0 0  Afraid - awful might happen 0 0  Total GAD 7 Score 0 1  Anxiety Difficulty Not difficult at all Not difficult at all     Most recent fall risk assessment:    12/28/2022    2:52 PM  Fall Risk   Falls in the past year? 0     Most recent depression screenings:    12/28/2022    2:52 PM 10/03/2022    1:02 PM  PHQ 2/9 Scores  PHQ - 2 Score 0 0  PHQ- 9 Score 0     Vision:Within last year and Dental: No current dental problems and Receives regular dental care  Past Medical History:  Diagnosis Date   Anxiety    Depression    HLD  (hyperlipidemia)    Hypertension    Migraine    resolved      Patient Care Team: Gabriel Earing, FNP as PCP - General (Family Medicine) Antoine Poche, MD as PCP - Cardiology (Cardiology)   Outpatient Medications Prior to Visit  Medication Sig   acetaminophen (TYLENOL) 325 MG tablet Take 2 tablets (650 mg total) by mouth every 6 (six) hours as needed for mild pain (or Fever >/= 101).   aspirin EC 81 MG tablet Take 1 tablet (81 mg total) by mouth daily with breakfast. Swallow whole.   atorvastatin (LIPITOR) 40 MG tablet Take 1 tablet (40 mg total) by mouth daily.   fluticasone (FLONASE) 50 MCG/ACT nasal spray Place 2 sprays into both nostrils daily.   sertraline (ZOLOFT) 50 MG tablet Take 1 tablet (50 mg total) by mouth daily.   amLODipine (NORVASC) 5 MG tablet Take 1 tablet (5 mg total) by mouth daily.   traZODone (DESYREL) 50 MG tablet Take 1 tablet (50  mg total) by mouth at bedtime. (Patient not taking: Reported on 12/28/2022)   [DISCONTINUED] Na Sulfate-K Sulfate-Mg Sulf 17.5-3.13-1.6 GM/177ML SOLN As directed   No facility-administered medications prior to visit.    ROS Negative unless specially indicated above in HPI.     Objective:     BP 111/67   Pulse 75   Temp 98.3 F (36.8 C) (Temporal)   Ht 5\' 3"  (1.6 m)   Wt 107 lb 6.4 oz (48.7 kg)   SpO2 96%   BMI 19.03 kg/m    Physical Exam Vitals and nursing note reviewed.  Constitutional:      General: She is not in acute distress.    Appearance: Normal appearance. She is not ill-appearing, toxic-appearing or diaphoretic.  HENT:     Head: Normocephalic.     Right Ear: Tympanic membrane, ear canal and external ear normal.     Left Ear: Tympanic membrane, ear canal and external ear normal.     Nose: Nose normal.     Mouth/Throat:     Mouth: Mucous membranes are dry.     Pharynx: Oropharynx is clear.  Eyes:     Extraocular Movements: Extraocular movements intact.     Conjunctiva/sclera: Conjunctivae normal.      Pupils: Pupils are equal, round, and reactive to light.  Cardiovascular:     Rate and Rhythm: Normal rate and regular rhythm.     Pulses: Normal pulses.     Heart sounds: Normal heart sounds. No murmur heard.    No friction rub. No gallop.  Pulmonary:     Effort: Pulmonary effort is normal.     Breath sounds: Normal breath sounds.  Abdominal:     General: Bowel sounds are normal. There is no distension.     Palpations: Abdomen is soft. There is no mass.     Tenderness: There is no abdominal tenderness. There is no guarding.  Musculoskeletal:     Cervical back: Normal range of motion and neck supple. No tenderness.     Right lower leg: No edema.     Left lower leg: No edema.  Skin:    General: Skin is warm and dry.     Capillary Refill: Capillary refill takes less than 2 seconds.     Findings: No lesion or rash.  Neurological:     General: No focal deficit present.     Mental Status: She is alert and oriented to person, place, and time.     Cranial Nerves: No cranial nerve deficit.     Motor: No weakness.     Coordination: Coordination normal.     Gait: Gait normal.  Psychiatric:        Mood and Affect: Mood normal.        Behavior: Behavior normal.        Thought Content: Thought content normal.        Judgment: Judgment normal.      No results found for any visits on 12/28/22.     Assessment & Plan:    Routine Health Maintenance and Physical Exam  Nelia was seen today for annual exam.  Diagnoses and all orders for this visit:  Routine general medical examination at a health care facility  Primary hypertension Well controlled on current regimen.  -     CBC with Differential/Platelet -     CMP14+EGFR -     TSH -     fluticasone (FLONASE) 50 MCG/ACT nasal spray; Place 2 sprays into both nostrils  daily. -     amLODipine (NORVASC) 5 MG tablet; Take 1 tablet (5 mg total) by mouth daily.  Mixed hyperlipidemia On statin. Labs pending.  -     Lipid panel -      atorvastatin (LIPITOR) 40 MG tablet; Take 1 tablet (40 mg total) by mouth daily.  Situational depression Anxiety Well controlled on current regimen.  -     sertraline (ZOLOFT) 50 MG tablet; Take 1 tablet (50 mg total) by mouth daily.  Need for immunization against influenza Flu vaccine today.    Immunization History  Administered Date(s) Administered   Influenza,inj,Quad PF,6+ Mos 11/28/2017   Influenza,trivalent, recombinat, inj, PF 12/27/2016   Moderna Sars-Covid-2 Vaccination 06/09/2019, 07/07/2019   PNEUMOCOCCAL CONJUGATE-20 06/16/2022   Tdap 06/25/2022    Health Maintenance  Topic Date Due   DEXA SCAN  Never done   INFLUENZA VACCINE  09/27/2022   COVID-19 Vaccine (3 - 2023-24 season) 10/28/2022   Zoster Vaccines- Shingrix (1 of 2) 09/24/2023 (Originally 08/12/2006)   Medicare Annual Wellness (AWV)  10/03/2023   Lung Cancer Screening  10/17/2023   MAMMOGRAM  09/02/2024   Colonoscopy  03/03/2030   DTaP/Tdap/Td (2 - Td or Tdap) 06/24/2032   Pneumonia Vaccine 19+ Years old  Completed   Hepatitis C Screening  Completed   HPV VACCINES  Aged Out    Discussed health benefits of physical activity, and encouraged her to engage in regular exercise appropriate for her age and condition.  Problem List Items Addressed This Visit       Cardiovascular and Mediastinum   Primary hypertension   Relevant Medications   fluticasone (FLONASE) 50 MCG/ACT nasal spray   atorvastatin (LIPITOR) 40 MG tablet   amLODipine (NORVASC) 5 MG tablet   Other Relevant Orders   CBC with Differential/Platelet   CMP14+EGFR   TSH     Other   Situational depression   Relevant Medications   sertraline (ZOLOFT) 50 MG tablet   Anxiety   Relevant Medications   sertraline (ZOLOFT) 50 MG tablet   Hyperlipidemia   Relevant Medications   atorvastatin (LIPITOR) 40 MG tablet   amLODipine (NORVASC) 5 MG tablet   Other Relevant Orders   Lipid panel   Other Visit Diagnoses     Routine general  medical examination at a health care facility    -  Primary   Need for immunization against influenza          No follow-ups on file.     Gabriel Earing, FNP

## 2023-01-01 LAB — CMP14+EGFR
ALT: 22 [IU]/L (ref 0–32)
AST: 49 [IU]/L — ABNORMAL HIGH (ref 0–40)
Albumin: 4.5 g/dL (ref 3.9–4.9)
Alkaline Phosphatase: 100 [IU]/L (ref 44–121)
BUN/Creatinine Ratio: 12 (ref 12–28)
BUN: 11 mg/dL (ref 8–27)
Bilirubin Total: 0.4 mg/dL (ref 0.0–1.2)
CO2: 18 mmol/L — ABNORMAL LOW (ref 20–29)
Calcium: 9.2 mg/dL (ref 8.7–10.3)
Chloride: 101 mmol/L (ref 96–106)
Creatinine, Ser: 0.91 mg/dL (ref 0.57–1.00)
Globulin, Total: 2.2 g/dL (ref 1.5–4.5)
Glucose: 67 mg/dL — ABNORMAL LOW (ref 70–99)
Potassium: 4.2 mmol/L (ref 3.5–5.2)
Sodium: 138 mmol/L (ref 134–144)
Total Protein: 6.7 g/dL (ref 6.0–8.5)
eGFR: 70 mL/min/{1.73_m2} (ref >59)

## 2023-01-01 LAB — CBC WITH DIFFERENTIAL/PLATELET
Basophils Absolute: 0.1 10*3/uL (ref 0.0–0.2)
Basos: 2 %
EOS (ABSOLUTE): 0.1 10*3/uL (ref 0.0–0.4)
Eos: 2 %
Hematocrit: 38.5 % (ref 34.0–46.6)
Hemoglobin: 12.9 g/dL (ref 11.1–15.9)
Immature Grans (Abs): 0 10*3/uL (ref 0.0–0.1)
Immature Granulocytes: 0 %
Lymphocytes Absolute: 2.6 10*3/uL (ref 0.7–3.1)
Lymphs: 49 %
MCH: 32 pg (ref 26.6–33.0)
MCHC: 33.5 g/dL (ref 31.5–35.7)
MCV: 96 fL (ref 79–97)
Monocytes Absolute: 0.5 10*3/uL (ref 0.1–0.9)
Monocytes: 9 %
Neutrophils Absolute: 2 10*3/uL (ref 1.4–7.0)
Neutrophils: 38 %
Platelets: 228 10*3/uL (ref 150–450)
RBC: 4.03 x10E6/uL (ref 3.77–5.28)
RDW: 13.2 % (ref 11.7–15.4)
WBC: 5.2 10*3/uL (ref 3.4–10.8)

## 2023-01-01 LAB — LIPID PANEL
Chol/HDL Ratio: 1.8 ratio (ref 0.0–4.4)
Cholesterol, Total: 249 mg/dL — ABNORMAL HIGH (ref 100–199)
HDL: 142 mg/dL (ref >39)
LDL Chol Calc (NIH): 89 mg/dL (ref 0–99)
Triglycerides: 112 mg/dL (ref 0–149)
VLDL Cholesterol Cal: 18 mg/dL (ref 5–40)

## 2023-01-01 LAB — TSH: TSH: 2.44 u[IU]/mL (ref 0.450–4.500)

## 2023-01-17 ENCOUNTER — Other Ambulatory Visit: Payer: Self-pay

## 2023-01-17 ENCOUNTER — Encounter (HOSPITAL_COMMUNITY): Payer: Self-pay

## 2023-01-17 ENCOUNTER — Encounter (HOSPITAL_COMMUNITY)
Admission: RE | Admit: 2023-01-17 | Discharge: 2023-01-17 | Disposition: A | Discharge status: Home / Self Care | Payer: Medicare HMO | Source: Ambulatory Visit | Attending: Internal Medicine | Admitting: Internal Medicine

## 2023-01-21 ENCOUNTER — Ambulatory Visit (HOSPITAL_COMMUNITY)
Admission: RE | Admit: 2023-01-21 | Discharge: 2023-01-21 | Disposition: A | Discharge status: Home / Self Care | Payer: Medicare HMO | Attending: Internal Medicine | Admitting: Internal Medicine

## 2023-01-21 ENCOUNTER — Ambulatory Visit (HOSPITAL_COMMUNITY): Payer: Medicare HMO | Admitting: Anesthesiology

## 2023-01-21 ENCOUNTER — Encounter (HOSPITAL_COMMUNITY): Payer: Self-pay

## 2023-01-21 ENCOUNTER — Encounter (HOSPITAL_COMMUNITY)
Admission: RE | Disposition: A | Discharge status: Home / Self Care | Payer: Self-pay | Source: Home / Self Care | Attending: Internal Medicine

## 2023-01-21 ENCOUNTER — Other Ambulatory Visit: Payer: Self-pay

## 2023-01-21 DIAGNOSIS — F17210 Nicotine dependence, cigarettes, uncomplicated: Secondary | ICD-10-CM | POA: Diagnosis not present

## 2023-01-21 DIAGNOSIS — Z860101 Personal history of adenomatous and serrated colon polyps: Secondary | ICD-10-CM | POA: Diagnosis not present

## 2023-01-21 DIAGNOSIS — I10 Essential (primary) hypertension: Secondary | ICD-10-CM | POA: Diagnosis not present

## 2023-01-21 DIAGNOSIS — Z1211 Encounter for screening for malignant neoplasm of colon: Secondary | ICD-10-CM | POA: Diagnosis present

## 2023-01-21 DIAGNOSIS — K648 Other hemorrhoids: Secondary | ICD-10-CM | POA: Insufficient documentation

## 2023-01-21 DIAGNOSIS — D128 Benign neoplasm of rectum: Secondary | ICD-10-CM | POA: Insufficient documentation

## 2023-01-21 DIAGNOSIS — Z9889 Other specified postprocedural states: Secondary | ICD-10-CM

## 2023-01-21 DIAGNOSIS — K635 Polyp of colon: Secondary | ICD-10-CM

## 2023-01-21 DIAGNOSIS — K621 Rectal polyp: Secondary | ICD-10-CM

## 2023-01-21 DIAGNOSIS — F418 Other specified anxiety disorders: Secondary | ICD-10-CM | POA: Insufficient documentation

## 2023-01-21 DIAGNOSIS — K5730 Diverticulosis of large intestine without perforation or abscess without bleeding: Secondary | ICD-10-CM | POA: Insufficient documentation

## 2023-01-21 DIAGNOSIS — Z09 Encounter for follow-up examination after completed treatment for conditions other than malignant neoplasm: Secondary | ICD-10-CM | POA: Insufficient documentation

## 2023-01-21 HISTORY — PX: COLONOSCOPY WITH PROPOFOL: SHX5780

## 2023-01-21 HISTORY — PX: POLYPECTOMY: SHX5525

## 2023-01-21 SURGERY — COLONOSCOPY WITH PROPOFOL
Anesthesia: Monitor Anesthesia Care

## 2023-01-21 MED ORDER — PROPOFOL 500 MG/50ML IV EMUL
INTRAVENOUS | Status: DC | PRN
  Administered 2023-01-21: 200 ug/kg/min via INTRAVENOUS

## 2023-01-21 MED ORDER — LACTATED RINGERS IV SOLN: INTRAVENOUS | Status: DC | PRN

## 2023-01-21 MED ORDER — PROPOFOL 10 MG/ML IV BOLUS
INTRAVENOUS | Status: DC | PRN
  Administered 2023-01-21: 100 mg via INTRAVENOUS

## 2023-01-21 MED ORDER — LIDOCAINE HCL (CARDIAC) PF 100 MG/5ML IV SOSY
PREFILLED_SYRINGE | INTRAVENOUS | Status: DC | PRN
  Administered 2023-01-21: 50 mg via INTRAVENOUS

## 2023-01-21 NOTE — Anesthesia Preprocedure Evaluation (Signed)
Anesthesia Evaluation  Patient identified by MRN, date of birth, ID band Patient awake    Reviewed: Allergy & Precautions, H&P , NPO status , Patient's Chart, lab work & pertinent test results  Airway Mallampati: II  TM Distance: >3 FB Neck ROM: Full    Dental no notable dental hx. (+) Dental Advisory Given, Teeth Intact No notable dental injury :   Pulmonary Current Smoker and Patient abstained from smoking. Pulmonary nodules   Pulmonary exam normal breath sounds clear to auscultation       Cardiovascular hypertension, Pt. on medications Normal cardiovascular exam Rhythm:Regular Rate:Normal     Neuro/Psych  Headaches PSYCHIATRIC DISORDERS Anxiety Depression       GI/Hepatic negative GI ROS,,,(+)     substance abuse  marijuana use  Endo/Other  negative endocrine ROS    Renal/GU negative Renal ROS  negative genitourinary   Musculoskeletal negative musculoskeletal ROS (+)    Abdominal   Peds negative pediatric ROS (+)  Hematology negative hematology ROS (+)   Anesthesia Other Findings   Reproductive/Obstetrics negative OB ROS                             Anesthesia Physical Anesthesia Plan  ASA: 2  Anesthesia Plan: MAC   Post-op Pain Management:    Induction:   PONV Risk Score and Plan: 0 and Treatment may vary due to age or medical condition  Airway Management Planned: Nasal Cannula and Natural Airway  Additional Equipment: None  Intra-op Plan:   Post-operative Plan:   Informed Consent: I have reviewed the patients History and Physical, chart, labs and discussed the procedure including the risks, benefits and alternatives for the proposed anesthesia with the patient or authorized representative who has indicated his/her understanding and acceptance.     Dental advisory given  Plan Discussed with: CRNA  Anesthesia Plan Comments:         Anesthesia Quick  Evaluation

## 2023-01-21 NOTE — Transfer of Care (Signed)
Immediate Anesthesia Transfer of Care Note  Patient: Tammy Proctor  Procedure(s) Performed: COLONOSCOPY WITH PROPOFOL POLYPECTOMY  Patient Location: Short Stay  Anesthesia Type:General  Level of Consciousness: awake, alert , oriented, and patient cooperative  Airway & Oxygen Therapy: Patient Spontanous Breathing  Post-op Assessment: Report given to RN, Post -op Vital signs reviewed and stable, and Patient moving all extremities X 4  Post vital signs: Reviewed and stable  Last Vitals:  Vitals Value Taken Time  BP 111/71 01/21/23 1245  Temp 36.6 C 01/21/23 1245  Pulse 74 01/21/23 1245  Resp 16 01/21/23 1245  SpO2 100 % 01/21/23 1245    Last Pain:  Vitals:   01/21/23 1245  TempSrc: Oral  PainSc: 0-No pain         Complications: No notable events documented.

## 2023-01-21 NOTE — Op Note (Signed)
[facility] Patient Name: Tammy Proctor Procedure Date: 01/21/2023 11:53 AM MRN: [id number] Date of Birth: 05-Jul-1955 Attending MD: Hennie Duos. [name] , [place], [id number] CSN: [id number] Age: 67 Admit Type: Outpatient Procedure:                Colonoscopy Indications:              High risk colon cancer surveillance: Personal                            history of adenoma (10 mm or greater in size) Providers:                Hennie Duos. [name], DO, Angelica Ran, Lennice Sites                            Technician, Technician Referring MD:              Medicines:                See the Anesthesia note for documentation of the                            administered medications Complications:            No immediate complications. Estimated Blood Loss:     Estimated blood loss was minimal. Procedure:                Pre-Anesthesia Assessment:                           - The anesthesia plan was to use monitored                            anesthesia care (MAC).                           After obtaining informed consent, the colonoscope                            was passed under direct vision. Throughout the                            procedure, the patient's blood pressure, pulse, and                            oxygen saturations were monitored continuously. The                            PCF-HQ190L ([id number]) scope was introduced through                            the anus and advanced to the the cecum, identified                            by appendiceal orifice and ileocecal valve. The                            colonoscopy  was performed without difficulty. The                            patient tolerated the procedure well. The quality                            of the bowel preparation was evaluated using the                            BBPS [facility] Bowel Preparation Scale) with scores                            of: Right Colon = 2 (minor amount of residual                             staining, small fragments of stool and/or opaque                            liquid, but mucosa seen well), Transverse Colon = 2                            (minor amount of residual staining, small fragments                            of stool and/or opaque liquid, but mucosa seen                            well) and Left Colon = 2 (minor amount of residual                            staining, small fragments of stool and/or opaque                            liquid, but mucosa seen well). The total BBPS score                            equals 6. The quality of the bowel preparation was                            good. Scope In: 12:04:21 PM Scope Out: 12:40:24 PM Scope Withdrawal Time: 0 hours 28 minutes 48 seconds  Total Procedure Duration: 0 hours 36 minutes 3 seconds  Findings:      Non-bleeding internal hemorrhoids were found during endoscopy.      Multiple large-mouthed and small-mouthed diverticula were found in the       sigmoid colon and descending colon.      Three sessile polyps were found in the rectum. The polyps were 3 to 5 mm       in size. These polyps were removed with a cold snare. Resection and       retrieval were complete.      A tattoo was seen in the transverse colon. The tattoo site appeared       normal. Spent a long time evaluating for mass/polyp seen  prior. No       evidence, appeared to be a polypectomy scar in the area (see pictures) Impression:               - Non-bleeding internal hemorrhoids.                           - Diverticulosis in the sigmoid colon and in the                            descending colon.                           - Three 3 to 5 mm polyps in the rectum, removed                            with a cold snare. Resected and retrieved.                           - A tattoo was seen in the transverse colon. The                            tattoo site appeared normal. Moderate Sedation:      Per Anesthesia Care Recommendation:           -  Patient has a contact number available for                            emergencies. The signs and symptoms of potential                            delayed complications were discussed with the                            patient. Return to normal activities tomorrow.                            Written discharge instructions were provided to the                            patient.                           - Resume previous diet.                           - Continue present medications.                           - Await pathology results.                           - I will discuss case further with Dr. Lovell Sheehan.                            Return to GI clinic in 4 weeks Procedure Code(s):        ---  Professional ---                           [phone], Colonoscopy, flexible; with removal of                            tumor(s), polyp(s), or other lesion(s) by snare                            technique Diagnosis Code(s):        --- Professional ---                           Z86.010, Personal history of colonic polyps                           K64.8, Other hemorrhoids                           D12.8, Benign neoplasm of rectum                           K57.30, Diverticulosis of large intestine without                            perforation or abscess without bleeding CPT copyright 2022 American Medical Association. All rights reserved. The codes documented in this report are preliminary and upon coder review may  be revised to meet current compliance requirements. Hennie Duos. [name], DO Hennie Duos. [name], DO 01/21/2023 3:58:54 PM This report has been signed electronically. Number of Addenda: 0

## 2023-01-21 NOTE — Discharge Instructions (Addendum)
  Colonoscopy Discharge Instructions  Read the instructions outlined below and refer to this sheet in the next few weeks. These discharge instructions provide you with general information on caring for yourself after you leave the hospital. Your doctor may also give you specific instructions. While your treatment has been planned according to the most current medical practices available, unavoidable complications occasionally occur.   ACTIVITY You may resume your regular activity, but move at a slower pace for the next 24 hours.  Take frequent rest periods for the next 24 hours.  Walking will help get rid of the air and reduce the bloated feeling in your belly (abdomen).  No driving for 24 hours (because of the medicine (anesthesia) used during the test).   Do not sign any important legal documents or operate any machinery for 24 hours (because of the anesthesia used during the test).  NUTRITION Drink plenty of fluids.  You may resume your normal diet as instructed by your doctor.  Begin with a light meal and progress to your normal diet. Heavy or fried foods are harder to digest and may make you feel sick to your stomach (nauseated).  Avoid alcoholic beverages for 24 hours or as instructed.  MEDICATIONS You may resume your normal medications unless your doctor tells you otherwise.  WHAT YOU CAN EXPECT TODAY Some feelings of bloating in the abdomen.  Passage of more gas than usual.  Spotting of blood in your stool or on the toilet paper.  IF YOU HAD POLYPS REMOVED DURING THE COLONOSCOPY: No aspirin products for 7 days or as instructed.  No alcohol for 7 days or as instructed.  Eat a soft diet for the next 24 hours.  FINDING OUT THE RESULTS OF YOUR TEST Not all test results are available during your visit. If your test results are not back during the visit, make an appointment with your caregiver to find out the results. Do not assume everything is normal if you have not heard from your  caregiver or the medical facility. It is important for you to follow up on all of your test results.  SEEK IMMEDIATE MEDICAL ATTENTION IF: You have more than a spotting of blood in your stool.  Your belly is swollen (abdominal distention).  You are nauseated or vomiting.  You have a temperature over 101.  You have abdominal pain or discomfort that is severe or gets worse throughout the day.   I did not see any evidence of large polyp ion your colon today. I did remove several small polyps in your rectum. Follow up with Dr. [name] in 3-4 weeks to discuss further.   I hope you have a great rest of your week!  Hennie Duos. [name], D.O. Gastroenterology and Hepatology [facility]

## 2023-01-21 NOTE — Anesthesia Postprocedure Evaluation (Signed)
Anesthesia Post Note  Patient: Tammy Proctor  Procedure(s) Performed: COLONOSCOPY WITH PROPOFOL POLYPECTOMY  Patient location during evaluation: PACU Anesthesia Type: MAC Level of consciousness: awake and alert Pain management: pain level controlled Vital Signs Assessment: post-procedure vital signs reviewed and stable Respiratory status: spontaneous breathing, nonlabored ventilation, respiratory function stable and patient connected to nasal cannula oxygen Cardiovascular status: blood pressure returned to baseline and stable Postop Assessment: no apparent nausea or vomiting Anesthetic complications: no   There were no known notable events for this encounter.   Last Vitals:  Vitals:   01/21/23 1105 01/21/23 1245  BP: 129/70 111/71  Pulse: 67 74  Resp: (!) 22 16  Temp: 36.6 C 36.6 C  SpO2: 98% 100%    Last Pain:  Vitals:   01/21/23 1245  TempSrc: Oral  PainSc: 0-No pain                 Rayni Nemitz L Garlan Drewes

## 2023-01-21 NOTE — H&P (Signed)
Primary Care Physician:  Gabriel Earing, FNP Primary Gastroenterologist:  Dr. [name]  Pre-Procedure History & Physical: HPI:  Tammy Proctor is a 67 y.o. female is here for a colonoscopy to be performed for history of polyps, transverse colon mass  Past Medical History:  Diagnosis Date   Anxiety    Depression    HLD (hyperlipidemia)    Hypertension    Migraine    resolved    Past Surgical History:  Procedure Laterality Date   BIOPSY  01/25/2020   Procedure: BIOPSY;  Surgeon: Lanelle Bal, DO;  Location: [facility];  Service: Endoscopy;;   BREAST EXCISIONAL BIOPSY Right    benign   CATARACT EXTRACTION Right    CATARACT EXTRACTION W/PHACO Left 10/12/2022   Procedure: CATARACT EXTRACTION PHACO AND INTRAOCULAR LENS PLACEMENT (IOC);  Surgeon: [name], MD;  Location: [facility];  Service: Ophthalmology;  Laterality: Left;  CDE 85.95   COLONOSCOPY WITH PROPOFOL N/A 01/25/2020   Procedure: COLONOSCOPY WITH PROPOFOL;  Surgeon: Lanelle Bal, DO;  Location: [facility];  Service: Endoscopy;  Laterality: N/A;  9:00am   COLONOSCOPY WITH PROPOFOL N/A 03/03/2020   Procedure: COLONOSCOPY WITH PROPOFOL;  Surgeon: Lanelle Bal, DO;  Location: [facility];  Service: Endoscopy;  Laterality: N/A;  10:15am   NASAL SINUS SURGERY     POLYPECTOMY  03/03/2020   Procedure: POLYPECTOMY;  Surgeon: Lanelle Bal, DO;  Location: [facility];  Service: Endoscopy;;   TUBAL LIGATION      Prior to Admission medications   Medication Sig Start Date End Date Taking? Authorizing Provider  acetaminophen (TYLENOL) 325 MG tablet Take 2 tablets (650 mg total) by mouth every 6 (six) hours as needed for mild pain (or Fever >/= 101). 06/16/22  Yes Emokpae, Courage, MD  amLODipine (NORVASC) 5 MG tablet Take 1 tablet (5 mg total) by mouth daily. 12/28/22 03/28/23 Yes Gabriel Earing, FNP  aspirin EC 81 MG tablet Take 1 tablet (81 mg total) by mouth daily with breakfast. Swallow whole.  06/17/22  Yes Emokpae, Courage, MD  atorvastatin (LIPITOR) 40 MG tablet Take 1 tablet (40 mg total) by mouth daily. 12/28/22  Yes Gabriel Earing, FNP  fluticasone (FLONASE) 50 MCG/ACT nasal spray Place 2 sprays into both nostrils daily. 12/28/22  Yes Gabriel Earing, FNP  sertraline (ZOLOFT) 50 MG tablet Take 1 tablet (50 mg total) by mouth daily. 12/28/22 12/28/23 Yes Gabriel Earing, FNP    Allergies as of 12/04/2022 - Review Complete 10/12/2022  Allergen Reaction Noted   Ceclor [cefaclor] Hives 07/06/2013    Family History  Problem Relation Age of Onset   Cancer Mother        breast   Diabetes Mother    Heart disease Father    Mental illness Father    Heart attack Father    Heart attack Sister    Heart disease Sister    Heart disease Sister    Diabetes Sister    Cancer Brother        prostate   Diabetes Brother    Colon cancer Neg Hx    Breast cancer Neg Hx     Social History   Socioeconomic History   Marital status: Divorced    Spouse name: Not on file   Number of children: 3   Years of education: 14   Highest education level: Associate degree: academic program  Occupational History   Not on file  Tobacco Use  Smoking status: Every Day    Current packs/day: 0.50    Average packs/day: 0.5 packs/day for 52.3 years (26.2 ttl pk-yrs)    Types: Cigarettes    Start date: 08/12/1974   Smokeless tobacco: Never  Vaping Use   Vaping status: Never Used  Substance and Sexual Activity   Alcohol use: Yes    Alcohol/week: 6.0 standard drinks of alcohol    Types: 6 Cans of beer per week    Comment: occasional   Drug use: Yes    Types: Marijuana    Comment: once per month 01/20/2023 last used   Sexual activity: Yes    Birth control/protection: Surgical  Other Topics Concern   Not on file  Social History Narrative   Not on file   Social Determinants of Health   Financial Resource Strain: Low Risk  (10/03/2022)   Overall Financial Resource Strain (CARDIA)     Difficulty of Paying Living Expenses: Not hard at all  Food Insecurity: No Food Insecurity (10/03/2022)   Hunger Vital Sign    Worried About Running Out of Food in the Last Year: Never true    Ran Out of Food in the Last Year: Never true  Transportation Needs: No Transportation Needs (10/03/2022)   PRAPARE - Administrator, Civil Service (Medical): No    Lack of Transportation (Non-Medical): No  Physical Activity: Insufficiently Active (10/03/2022)   Exercise Vital Sign    Days of Exercise per Week: 3 days    Minutes of Exercise per Session: 30 min  Stress: No Stress Concern Present (10/03/2022)   [employer] of Occupational Health - Occupational Stress Questionnaire    Feeling of Stress : Not at all  Social Connections: Socially Isolated (10/03/2022)   Social Connection and Isolation Panel [NHANES]    Frequency of Communication with Friends and Family: More than three times a week    Frequency of Social Gatherings with Friends and Family: More than three times a week    Attends Religious Services: Never    Database administrator or Organizations: No    Attends Banker Meetings: Never    Marital Status: Divorced  Catering manager Violence: Not At Risk (10/03/2022)   Humiliation, Afraid, Rape, and Kick questionnaire    Fear of Current or Ex-Partner: No    Emotionally Abused: No    Physically Abused: No    Sexually Abused: No    Review of Systems: See HPI, otherwise negative ROS  Physical Exam: Vital signs in last 24 hours: Temp:  [97.9 F (36.6 C)] 97.9 F (36.6 C) (11/25 1105) Pulse Rate:  [67] 67 (11/25 1105) Resp:  [22] 22 (11/25 1105) BP: (129)/(70) 129/70 (11/25 1105) SpO2:  [98 %] 98 % (11/25 1105) Weight:  [48.2 kg] 48.2 kg (11/25 1052)   General:   Alert,  Well-developed, well-nourished, pleasant and cooperative in NAD Head:  Normocephalic and atraumatic. Eyes:  Sclera clear, no icterus.   Conjunctiva pink. Ears:  Normal auditory  acuity. Nose:  No deformity, discharge,  or lesions. Msk:  Symmetrical without gross deformities. Normal posture. Extremities:  Without clubbing or edema. Neurologic:  Alert and  oriented x4;  grossly normal neurologically. Skin:  Intact without significant lesions or rashes. Psych:  Alert and cooperative. Normal mood and affect.  Impression/Plan: Tammy Proctor is here for a colonoscopy to be performed for history of polyps, transverse colon mass  The risks of the procedure including infection, bleed, or perforation as well as  benefits, limitations, alternatives and imponderables have been reviewed with the patient. Questions have been answered. All parties agreeable.

## 2023-01-22 LAB — SURGICAL PATHOLOGY

## 2023-01-29 ENCOUNTER — Other Ambulatory Visit: Payer: Self-pay | Admitting: Family Medicine

## 2023-01-29 ENCOUNTER — Encounter (HOSPITAL_COMMUNITY): Payer: Self-pay | Admitting: Internal Medicine

## 2023-01-29 DIAGNOSIS — I10 Essential (primary) hypertension: Secondary | ICD-10-CM

## 2023-02-07 ENCOUNTER — Ambulatory Visit: Payer: Medicare HMO | Attending: Cardiology | Admitting: Cardiology

## 2023-02-07 NOTE — Progress Notes (Unsigned)
Clinical Summary Ms. Dimambro is a 67 y.o.female  1.Chest pain - admit 05/2022 with chest pain - atypical symptoms. Ongoing 8.5 hours constant, initially tender to palpation at onset and also worst with deep breathing  - - CT PE negative - trops neg x 2. EKG SR, diffuse prominent Twaves -05/2022 echo: LVEF 60-65%, no WMAs, grade I dd, normal RV  - 07/2022 nuclear stress: no ischemia    2. 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    3. HTN   4. HLD Past Medical History:  Diagnosis Date   Anxiety    Depression    HLD (hyperlipidemia)    Hypertension    Migraine    resolved     Allergies  Allergen Reactions   Ceclor [Cefaclor] Hives     Current Outpatient Medications  Medication Sig Dispense Refill   acetaminophen (TYLENOL) 325 MG tablet Take 2 tablets (650 mg total) by mouth every 6 (six) hours as needed for mild pain (or Fever >/= 101). 100 tablet 1   amLODipine (NORVASC) 5 MG tablet Take 1 tablet (5 mg total) by mouth daily. 90 tablet 1   aspirin EC 81 MG tablet Take 1 tablet (81 mg total) by mouth daily with breakfast. Swallow whole. 30 tablet 11   atorvastatin (LIPITOR) 40 MG tablet Take 1 tablet (40 mg total) by mouth daily. 90 tablet 1   fluticasone (FLONASE) 50 MCG/ACT nasal spray Use 2 spray(s) in each nostril once daily 48 g 1   sertraline (ZOLOFT) 50 MG tablet Take 1 tablet (50 mg total) by mouth daily. 90 tablet 1   No current facility-administered medications for this visit.     Past Surgical History:  Procedure Laterality Date   BIOPSY  01/25/2020   Procedure: BIOPSY;  Surgeon: Lanelle Bal, DO;  [facility]: [facility];  Service: Endoscopy;;   BREAST EXCISIONAL BIOPSY Right    benign   CATARACT EXTRACTION Right    CATARACT EXTRACTION W/PHACO Left 10/12/2022   Procedure: CATARACT EXTRACTION PHACO AND INTRAOCULAR LENS PLACEMENT (IOC);  Surgeon:  [name], MD;  Location: [facility];  Service: Ophthalmology;  Laterality: Left;  CDE 85.95   COLONOSCOPY WITH PROPOFOL N/A 01/25/2020   Procedure: COLONOSCOPY WITH PROPOFOL;  Surgeon: Lanelle Bal, DO;  [facility]: [facility];  Service: Endoscopy;  Laterality: N/A;  9:00am   COLONOSCOPY WITH PROPOFOL N/A 03/03/2020   Procedure: COLONOSCOPY WITH PROPOFOL;  Surgeon: Lanelle Bal, DO;  [facility]: [facility];  Service: Endoscopy;  Laterality: N/A;  10:15am   COLONOSCOPY WITH PROPOFOL N/A 01/21/2023   Procedure: COLONOSCOPY WITH PROPOFOL;  Surgeon: Lanelle Bal, DO;  [facility]: [facility];  Service: Endoscopy;  Laterality: N/A;  12:30 PM, ASA 3   NASAL SINUS SURGERY     POLYPECTOMY  03/03/2020   Procedure: POLYPECTOMY;  Surgeon: Lanelle Bal, DO;  [facility]: [facility];  Service: Endoscopy;;   POLYPECTOMY  01/21/2023   Procedure: POLYPECTOMY;  Surgeon: Lanelle Bal, DO;  Location: [facility];  Service: Endoscopy;;   TUBAL LIGATION       Allergies  Allergen Reactions   Ceclor [Cefaclor] Hives      Family History  Problem Relation Age of Onset   Cancer Mother        breast   Diabetes Mother    Heart disease Father    Mental  illness Father    Heart attack Father    Heart attack Sister    Heart disease Sister    Heart disease Sister    Diabetes Sister    Cancer Brother        prostate   Diabetes Brother    Colon cancer Neg Hx    Breast cancer Neg Hx      Social History Ms. Piercefield reports that she has been smoking cigarettes. She started smoking about 48 years ago. She has a 26.2 pack-year smoking history. She has never used smokeless tobacco. Ms. Schmucker reports current alcohol use of about 6.0 standard drinks of alcohol per week.   Review of Systems CONSTITUTIONAL: No weight loss, fever, chills, weakness or fatigue.  HEENT: Eyes: No visual loss, blurred vision, double vision or yellow sclerae.No hearing loss, sneezing, congestion, runny  nose or sore throat.  SKIN: No rash or itching.  CARDIOVASCULAR:  RESPIRATORY: No shortness of breath, cough or sputum.  GASTROINTESTINAL: No anorexia, nausea, vomiting or diarrhea. No abdominal pain or blood.  GENITOURINARY: No burning on urination, no polyuria NEUROLOGICAL: No headache, dizziness, syncope, paralysis, ataxia, numbness or tingling in the extremities. No change in bowel or bladder control.  MUSCULOSKELETAL: No muscle, back pain, joint pain or stiffness.  LYMPHATICS: No enlarged nodes. No history of splenectomy.  PSYCHIATRIC: No history of depression or anxiety.  ENDOCRINOLOGIC: No reports of sweating, cold or heat intolerance. No polyuria or polydipsia.  Marland Kitchen   Physical Examination There were no vitals filed for this visit. There were no vitals filed for this visit.  Gen: resting comfortably, no acute distress HEENT: no scleral icterus, pupils equal round and reactive, no palptable cervical adenopathy,  CV Resp: Clear to auscultation bilaterally GI: abdomen is soft, non-tender, non-distended, normal bowel sounds, no hepatosplenomegaly MSK: extremities are warm, no edema.  Skin: warm, no rash Neuro:  no focal deficits Psych: appropriate affect   Diagnostic Studies     Assessment and Plan        Antoine Poche, M.D., F.A.C.C.

## 2023-02-13 ENCOUNTER — Ambulatory Visit: Payer: Medicare HMO | Admitting: Internal Medicine

## 2023-02-13 ENCOUNTER — Encounter: Payer: Self-pay | Admitting: Internal Medicine

## 2023-04-08 ENCOUNTER — Ambulatory Visit (INDEPENDENT_AMBULATORY_CARE_PROVIDER_SITE_OTHER): Payer: Medicare HMO

## 2023-04-08 ENCOUNTER — Ambulatory Visit (INDEPENDENT_AMBULATORY_CARE_PROVIDER_SITE_OTHER): Payer: Medicare HMO | Admitting: Family Medicine

## 2023-04-08 ENCOUNTER — Encounter: Payer: Self-pay | Admitting: Family Medicine

## 2023-04-08 VITALS — BP 131/69 | HR 86 | Temp 98.2°F | Ht 63.0 in | Wt 109.2 lb

## 2023-04-08 DIAGNOSIS — J189 Pneumonia, unspecified organism: Secondary | ICD-10-CM | POA: Diagnosis not present

## 2023-04-08 DIAGNOSIS — R058 Other specified cough: Secondary | ICD-10-CM

## 2023-04-08 DIAGNOSIS — R0602 Shortness of breath: Secondary | ICD-10-CM

## 2023-04-08 MED ORDER — ALBUTEROL SULFATE HFA 108 (90 BASE) MCG/ACT IN AERS: 2.0000 | INHALATION_SPRAY | Freq: Four times a day (QID) | RESPIRATORY_TRACT | 2 refills | Status: DC | PRN

## 2023-04-08 MED ORDER — AMOXICILLIN-POT CLAVULANATE 875-125 MG PO TABS: 1.0000 | ORAL_TABLET | Freq: Two times a day (BID) | ORAL | 0 refills | Status: AC

## 2023-04-08 MED ORDER — AZITHROMYCIN 250 MG PO TABS: ORAL_TABLET | ORAL | 0 refills | Status: DC

## 2023-04-08 MED ORDER — PREDNISONE 20 MG PO TABS: 40.0000 mg | ORAL_TABLET | Freq: Every day | ORAL | 0 refills | Status: AC

## 2023-04-08 NOTE — Progress Notes (Signed)
 Acute Office Visit  Subjective:     Patient ID: Tammy Proctor, female    DOB: [birth year], 68 y.o.   MRN: [id number]  Chief Complaint  Patient presents with   Cough   Shortness of Breath   Nasal Congestion   Fatigue    Cough This is a new problem. Episode onset: 2 weeks. The problem has been gradually worsening. The cough is Productive of sputum (yellow-brown). Associated symptoms include chest pain (in back, center of chest. Achy pain), chills, ear congestion, headaches, myalgias, nasal congestion, rhinorrhea, a sore throat, shortness of breath (with coughing) and wheezing. Pertinent negatives include no ear pain or fever. Risk factors for lung disease include smoking/tobacco exposure. Treatments tried: cold and flu. The treatment provided mild relief. Her past medical history is significant for COPD.    Review of Systems  Constitutional:  Positive for chills. Negative for fever.  HENT:  Positive for rhinorrhea and sore throat. Negative for ear pain.   Respiratory:  Positive for shortness of breath (with coughing) and wheezing.   Cardiovascular:  Positive for chest pain (in back, center of chest. Achy pain).  Musculoskeletal:  Positive for myalgias.  Neurological:  Positive for headaches.        Objective:    BP 131/69   Pulse 86   Temp 98.2 F (36.8 C)   Ht 5\' 3"  (1.6 m)   Wt 109 lb 3.2 oz (49.5 kg)   SpO2 99%   BMI 19.34 kg/m    Physical Exam Vitals and nursing note reviewed.  Constitutional:      General: She is not in acute distress.    Appearance: She is not ill-appearing, toxic-appearing or diaphoretic.  HENT:     Head: Normocephalic and atraumatic.     Right Ear: Tympanic membrane, ear canal and external ear normal.     Left Ear: Tympanic membrane and ear canal normal.     Nose: Congestion present.     Mouth/Throat:     Mouth: Mucous membranes are moist.     Pharynx: Oropharynx is clear. No pharyngeal swelling or oropharyngeal exudate.   Cardiovascular:     Rate and Rhythm: Normal rate and regular rhythm.  Pulmonary:     Effort: Pulmonary effort is normal.     Breath sounds: Examination of the right-upper field reveals rhonchi. Examination of the left-upper field reveals rhonchi. Examination of the right-middle field reveals rhonchi. Examination of the left-middle field reveals rhonchi. Examination of the right-lower field reveals rhonchi. Examination of the left-lower field reveals rhonchi. Rhonchi present. No wheezing.  Musculoskeletal:     Cervical back: Normal range of motion and neck supple.  Skin:    General: Skin is warm and dry.  Neurological:     General: No focal deficit present.     Mental Status: She is alert and oriented to person, place, and time.  Psychiatric:        Mood and Affect: Mood normal.        Behavior: Behavior normal.     No results found for any visits on 04/08/23.      Assessment & Plan:   Tammy Proctor was seen today for cough, shortness of breath, nasal congestion and fatigue.  Diagnoses and all orders for this visit:  Community acquired pneumonia of left lower lobe of lung Augmentin  and zpak as below. Prednisone  burst as below. Albuterol  prn. Mucinex for cough. Follow up in 4 weeks for repeat CXR, sooner for new or worsening symptoms.  -  DG Chest 2 View; Future -     amoxicillin -clavulanate (AUGMENTIN ) 875-125 MG tablet; Take 1 tablet by mouth 2 (two) times daily for 7 days. -     predniSONE  (DELTASONE ) 20 MG tablet; Take 2 tablets (40 mg total) by mouth daily with breakfast for 5 days. -     azithromycin  (ZITHROMAX  Z-PAK) 250 MG tablet; As directed  Shortness of breath -     albuterol  (VENTOLIN  HFA) 108 (90 Base) MCG/ACT inhaler; Inhale 2 puffs into the lungs every 6 (six) hours as needed for wheezing or shortness of breath.  The patient indicates understanding of these issues and agrees with the plan.  Albertha Huger, FNP

## 2023-04-17 ENCOUNTER — Encounter: Payer: Self-pay | Admitting: *Deleted

## 2023-04-18 ENCOUNTER — Encounter: Payer: Self-pay | Admitting: Family Medicine

## 2023-04-18 ENCOUNTER — Telehealth (INDEPENDENT_AMBULATORY_CARE_PROVIDER_SITE_OTHER): Payer: Medicare HMO | Admitting: Family Medicine

## 2023-04-18 DIAGNOSIS — J189 Pneumonia, unspecified organism: Secondary | ICD-10-CM

## 2023-04-18 MED ORDER — DOXYCYCLINE HYCLATE 100 MG PO TABS: 100.0000 mg | ORAL_TABLET | Freq: Two times a day (BID) | ORAL | 0 refills | Status: AC

## 2023-04-18 MED ORDER — BENZONATATE 100 MG PO CAPS: 100.0000 mg | ORAL_CAPSULE | Freq: Three times a day (TID) | ORAL | 0 refills | Status: DC | PRN

## 2023-04-18 MED ORDER — PREDNISONE 20 MG PO TABS: 40.0000 mg | ORAL_TABLET | Freq: Every day | ORAL | 0 refills | Status: AC

## 2023-04-18 NOTE — Progress Notes (Signed)
Virtual Visit via video Note   Due to COVID-19 pandemic this visit was conducted virtually. This visit type was conducted due to national recommendations for restrictions regarding the COVID-19 Pandemic (e.g. social distancing, sheltering in place) in an effort to limit this patient's exposure and mitigate transmission in our community. All issues noted in this document were discussed and addressed.  A physical exam was not performed with this format.  I connected with  Tammy Proctor  on 04/18/23 at 1316 by video and verified that I am speaking with the correct person using two identifiers. Tammy Proctor is currently located at home and her neighbor is currently with him during the visit. The provider, Gabriel Earing, FNP is located in their office at time of visit.  I discussed the limitations, risks, security and privacy concerns of performing an evaluation and management service by video  and the availability of in person appointments. I also discussed with the patient that there may be a patient responsible charge related to this service. The patient expressed understanding and agreed to proceed.  CC: cough  History and Present Illness:  Tammy Proctor was dx with CAP on 04/08/23. She was treated with augmentin, azithromycin and prednisone. Reports symptoms improved but worsened again after completion of abx. She had had increased productive cough with yellow-green sputum, shortness of breath, nasal congestion, wheezing, HA and chills for the last 4-5 days. This has been worsening. Has some discomfort in her chest with coughing intermittently. Denies fever. She is using albuterol with some improvement. She is unable to get to the office today due to the snow.     ROS As per HPI.    Observations/Objective: Alert and oriented. Respirations unlabored. No cyanosis. Non toxic appearing. Normal mood and behavior.   Assessment and Plan: Tammy Proctor was seen today for cough.  Diagnoses and  all orders for this visit:  Community acquired pneumonia of left lower lobe of lung Will treat with doxycyline and another prednisone burst. Can take tessalon perles prn. Discussed need for in person evaluation if symptoms worsen or persist.  -     doxycycline (VIBRA-TABS) 100 MG tablet; Take 1 tablet (100 mg total) by mouth 2 (two) times daily for 7 days. -     predniSONE (DELTASONE) 20 MG tablet; Take 2 tablets (40 mg total) by mouth daily with breakfast for 5 days. -     benzonatate (TESSALON PERLES) 100 MG capsule; Take 1 capsule (100 mg total) by mouth 3 (three) times daily as needed.     Follow Up Instructions: Return to office for new or worsening symptoms, or if symptoms persist.     I discussed the assessment and treatment plan with the patient. The patient was provided an opportunity to ask questions and all were answered. The patient agreed with the plan and demonstrated an understanding of the instructions.   The patient was advised to call back or seek an in-person evaluation if the symptoms worsen or if the condition fails to improve as anticipated.  The above assessment and management plan was discussed with the patient. The patient verbalized understanding of and has agreed to the management plan. Patient is aware to call the clinic if symptoms persist or worsen. Patient is aware when to return to the clinic for a follow-up visit. Patient educated on when it is appropriate to go to the emergency department.   Time call ended: 1327  I provided 11 minutes of face-to-face time during this encounter.  Gabriel Earing, FNP

## 2023-04-25 ENCOUNTER — Ambulatory Visit: Payer: Self-pay | Admitting: Family Medicine

## 2023-04-25 ENCOUNTER — Emergency Department (HOSPITAL_COMMUNITY)
Admission: EM | Admit: 2023-04-25 | Discharge: 2023-04-25 | Disposition: A | Discharge status: Home / Self Care | Payer: Medicare HMO

## 2023-04-25 ENCOUNTER — Emergency Department (HOSPITAL_COMMUNITY): Payer: Medicare HMO

## 2023-04-25 ENCOUNTER — Other Ambulatory Visit: Payer: Self-pay

## 2023-04-25 ENCOUNTER — Encounter (HOSPITAL_COMMUNITY): Payer: Self-pay

## 2023-04-25 DIAGNOSIS — J441 Chronic obstructive pulmonary disease with (acute) exacerbation: Secondary | ICD-10-CM | POA: Diagnosis not present

## 2023-04-25 DIAGNOSIS — I10 Essential (primary) hypertension: Secondary | ICD-10-CM | POA: Insufficient documentation

## 2023-04-25 DIAGNOSIS — R0602 Shortness of breath: Secondary | ICD-10-CM | POA: Diagnosis present

## 2023-04-25 DIAGNOSIS — Z720 Tobacco use: Secondary | ICD-10-CM | POA: Diagnosis not present

## 2023-04-25 LAB — BASIC METABOLIC PANEL
Anion gap: 13 (ref 5–15)
BUN: 8 mg/dL (ref 8–23)
CO2: 25 mmol/L (ref 22–32)
Calcium: 9.2 mg/dL (ref 8.9–10.3)
Chloride: 100 mmol/L (ref 98–111)
Creatinine, Ser: 0.53 mg/dL (ref 0.44–1.00)
GFR, Estimated: >60 mL/min (ref >60)
Glucose, Bld: 75 mg/dL (ref 70–99)
Potassium: 3.6 mmol/L (ref 3.5–5.1)
Sodium: 138 mmol/L (ref 135–145)

## 2023-04-25 LAB — RESP PANEL BY RT-PCR (RSV, FLU A&B, COVID)  RVPGX2
Influenza A by PCR: NEGATIVE
Influenza B by PCR: NEGATIVE
Resp Syncytial Virus by PCR: NEGATIVE
SARS Coronavirus 2 by RT PCR: NEGATIVE

## 2023-04-25 LAB — D-DIMER, QUANTITATIVE: D-Dimer, Quant: 0.7 ug{FEU}/mL — ABNORMAL HIGH (ref 0.00–0.50)

## 2023-04-25 LAB — BRAIN NATRIURETIC PEPTIDE: B Natriuretic Peptide: 67 pg/mL (ref 0.0–100.0)

## 2023-04-25 MED ORDER — IPRATROPIUM-ALBUTEROL 0.5-2.5 (3) MG/3ML IN SOLN
3.0000 mL | Freq: Once | RESPIRATORY_TRACT | Status: AC
  Administered 2023-04-25: 3 mL via RESPIRATORY_TRACT
  Filled 2023-04-25: qty 3

## 2023-04-25 MED ORDER — IOHEXOL 350 MG/ML SOLN
75.0000 mL | Freq: Once | INTRAVENOUS | Status: AC | PRN
  Administered 2023-04-25: 75 mL via INTRAVENOUS

## 2023-04-25 MED ORDER — PREDNISONE 10 MG PO TABS: ORAL_TABLET | ORAL | 0 refills | Status: AC

## 2023-04-25 NOTE — ED Provider Notes (Signed)
 Received signout from Dr. Rhae Hammock.  See his note for full HPI.  Signout; disposition pending CTA, rule out PE.  However, suspect undiagnosed COPD.  Reevaluated, does not appear to be distress. Lungs largely clear. Feeling improved after breathing treatments. vitals reassuring.  CTA negative for PE.  Patient stable for discharge at this time.   Tammy Spikes, DO 04/25/23 1725

## 2023-04-25 NOTE — ED Provider Notes (Signed)
 [place] EMERGENCY DEPARTMENT AT [facility] Provider Note   CSN: [id number] Arrival date & time: 04/25/23  1022     History  Chief Complaint  Patient presents with   Cough    Tammy Proctor is a 68 y.o. female.  68 year old female with past medical history of tobacco abuse, hypertension, and hyperlipidemia presenting to the emergency department today with persistent shortness of breath.  The patient saw her primary care doctor and was started on steroids as well as antibiotics.  She is been taking these as prescribed using albuterol but her symptoms continue to worsen.  The patient states she is coughing up a lot of sputum.  She is reporting some pleuritic chest pain with this.  Denies any leg pain or swelling.  She denies a history of DVT or pulmonary embolism, recent surgeries, recent travel.  She came to the ER today for further evaluation regarding this due to ongoing symptoms.   Cough Associated symptoms: shortness of breath        Home Medications Prior to Admission medications   Medication Sig Start Date End Date Taking? Authorizing Provider  predniSONE (DELTASONE) 10 MG tablet Take 4 tablets (40 mg total) by mouth daily for 3 days, THEN 3 tablets (30 mg total) daily for 3 days, THEN 2 tablets (20 mg total) daily for 3 days, THEN 1 tablet (10 mg total) daily for 3 days. 04/25/23 05/07/23 Yes Durwin Glaze, MD  acetaminophen (TYLENOL) 325 MG tablet Take 2 tablets (650 mg total) by mouth every 6 (six) hours as needed for mild pain (or Fever >/= 101). 06/16/22   Shon Hale, MD  albuterol (VENTOLIN HFA) 108 (90 Base) MCG/ACT inhaler Inhale 2 puffs into the lungs every 6 (six) hours as needed for wheezing or shortness of breath. 04/08/23   Gabriel Earing, FNP  amLODipine (NORVASC) 5 MG tablet Take 1 tablet (5 mg total) by mouth daily. 12/28/22 03/28/23  Gabriel Earing, FNP  aspirin EC 81 MG tablet Take 1 tablet (81 mg total) by mouth daily with breakfast.  Swallow whole. 06/17/22   Shon Hale, MD  atorvastatin (LIPITOR) 40 MG tablet Take 1 tablet (40 mg total) by mouth daily. 12/28/22   Gabriel Earing, FNP  benzonatate (TESSALON PERLES) 100 MG capsule Take 1 capsule (100 mg total) by mouth 3 (three) times daily as needed. 04/18/23   Gabriel Earing, FNP  doxycycline (VIBRA-TABS) 100 MG tablet Take 1 tablet (100 mg total) by mouth 2 (two) times daily for 7 days. 04/18/23 04/25/23  Gabriel Earing, FNP  fluticasone Aleda Grana) 50 MCG/ACT nasal spray Use 2 spray(s) in each nostril once daily 01/29/23   Gabriel Earing, FNP  sertraline (ZOLOFT) 50 MG tablet Take 1 tablet (50 mg total) by mouth daily. 12/28/22 12/28/23  Gabriel Earing, FNP      Allergies    Ceclor [cefaclor]    Review of Systems   Review of Systems  Respiratory:  Positive for cough and shortness of breath.   All other systems reviewed and are negative.   Physical Exam Updated Vital Signs BP (!) 145/74   Pulse 79   Temp 98.8 F (37.1 C) (Temporal)   Resp 13   Ht 5\' 3"  (1.6 m)   Wt 49.5 kg   SpO2 96%   BMI 19.33 kg/m  Physical Exam Vitals and nursing note reviewed.   Gen: NAD Eyes: PERRL, EOMI HEENT: no oropharyngeal swelling Neck: trachea midline Resp: Diminished with  bronchospastic cough noted, no appreciable wheezing Card: RRR, no murmurs, rubs, or gallops Abd: nontender, nondistended Extremities: no calf tenderness, no edema Vascular: 2+ radial pulses bilaterally, 2+ DP pulses bilaterally Skin: no rashes Psyc: acting appropriately   ED Results / Procedures / Treatments   Labs (all labs ordered are listed, but only abnormal results are displayed) Labs Reviewed  D-DIMER, QUANTITATIVE - Abnormal; Notable for the following components:      Result Value   D-Dimer, Quant 0.70 (*)    All other components within normal limits  RESP PANEL BY RT-PCR (RSV, FLU A&B, COVID)  RVPGX2  BASIC METABOLIC PANEL  BRAIN NATRIURETIC PEPTIDE    EKG EKG  Interpretation Date/Time:  Thursday April 25 2023 11:05:29 EST Ventricular Rate:  57 PR Interval:  147 QRS Duration:  100 QT Interval:  404 QTC Calculation: 394 R Axis:   80  Text Interpretation: Sinus rhythm Confirmed by Beckey Downing [phone]) on 04/25/2023 11:09:04 AM  Radiology DG Chest 2 View Result Date: 04/25/2023 CLINICAL DATA:  Shortness of breath. EXAM: CHEST - 2 VIEW COMPARISON:  04/08/2023. FINDINGS: Bilateral lungs appear hyperexpanded and hyperlucent with coarse bronchovascular markings, concerning for underlying COPD. Bilateral lungs otherwise appear clear. No dense consolidation or lung collapse. Bilateral costophrenic angles are clear. Normal cardio-mediastinal silhouette. No acute osseous abnormalities. The soft tissues are within normal limits. IMPRESSION: No active cardiopulmonary disease.  Probable COPD. Electronically Signed   By: Jules Schick M.D.   On: 04/25/2023 13:26    Procedures Procedures    Medications Ordered in ED Medications  ipratropium-albuterol (DUONEB) 0.5-2.5 (3) MG/3ML nebulizer solution 3 mL (3 mLs Nebulization Given 04/25/23 1122)  ipratropium-albuterol (DUONEB) 0.5-2.5 (3) MG/3ML nebulizer solution 3 mL (3 mLs Nebulization Given 04/25/23 1121)  iohexol (OMNIPAQUE) 350 MG/ML injection 75 mL (75 mLs Intravenous Contrast Given 04/25/23 1418)    ED Course/ Medical Decision Making/ A&P                                 Medical Decision Making 68 year old female with past medical history of tobacco abuse, hypertension, and hyperlipidemia presented to the emergency department today with shortness of breath and cough.  This is despite being on steroids and antibiotics for likely bronchitis/COPD.  I will further evaluate her here with basic labs well as an EKG and chest x-ray to eval for cardiac causes or pulmonary edema/pulmonary infiltrates, or pneumothorax.  Also obtain a D-dimer given her pleuritic pain although this certainly does sound more consistent  with infectious etiology or COPD.  I will give the patient DuoNebs here.  She is already taken steroids and antibiotics this morning so we will hold off until her workup is complete.  Also repeat a COVID/flu/RSV swab on the patient.  The patient's labs are reassuring.  D-dimer is elevated and CT angiogram is ordered.  This pending at the time of signout.  The patient remained stable here.  I anticipate that she will be discharged.  She will be given pulmonology follow-up if CT angiogram is negative.  Amount and/or Complexity of Data Reviewed Labs: ordered. Radiology: ordered.  Risk Prescription drug management.           Final Clinical Impression(s) / ED Diagnoses Final diagnoses:  COPD exacerbation (HCC)    Rx / DC Orders ED Discharge Orders          Ordered    predniSONE (DELTASONE) 10 MG tablet  Daily  04/25/23 1627              Durwin Glaze, MD 04/25/23 [phone]

## 2023-04-25 NOTE — ED Triage Notes (Signed)
 Pt arrived via POV from home after being advised by her PCP to seek evaluation at the ER for persistent cough and difficulty breathing. Pt reports dyspnea with exertion. Pt reports recently receiving antibiotics and steroids for pneumonia but reports symptoms have not been improving.

## 2023-04-25 NOTE — ED Notes (Signed)
 Respiratory at bedside for breathing treatment.

## 2023-04-25 NOTE — ED Notes (Signed)
 Patient transported to CT

## 2023-04-25 NOTE — Discharge Instructions (Addendum)
 I think your symptoms are likely from COPD which is a lung disorder.  Please take the steroids and continue these until you finish them.  Please call and schedule a follow-up appointment with the lung doctor at the number provided.  Return to the ER for worsening symptoms.

## 2023-04-25 NOTE — Telephone Encounter (Signed)
 Copied from CRM [id number]. Topic: Clinical - Red Word Triage >> Apr 25, 2023  8:40 AM Tammy Proctor wrote: Red Word that prompted transfer to Nurse Triage: The patient had an appointment on 2/20 for cough and congestion as she had previously finished antibiotics from her appointment on 2/10 and did not feel better. The patient states that she has not gotten better and is having difficulty breathing especially at night and when laying down   Chief Complaint: SOB Symptoms: intermittent SOB with exertion and at rest, SOB laying down flat, interrupted sleep, chest pain to left side chest intermittent with exertion and at rest, sore throat Frequency: intermittent Pertinent Negatives: Patient denies struggling to breathe, chest pain >5 min, sweating, heart racing, current SOB or chest pain, severe pain Disposition: [] 911 / [x] ED /[] Urgent Care (no appt availability in office) / [] Appointment(In office/virtual)/ []  [place] [facility]/ [] Home Care/ [] Refused Recommended Disposition /[] [place] [facility]/ []  Follow-up with PCP Additional Notes: Pt is poor historian. Pt reporting she "can't breathe sometimes," which as "been happening for last couple weeks," confirms worsened recently, thinks "weather is some of it." Pt reporting SOB is intermittent and regardless of resting or exerting herself. Pt confirms "sometimes when go outside and walk to store, have to sit down, can't breathe," and also having sore throat, using chloraseptic spray "sometimes can't swallow," and "chest hurts" to "left side." Pt confirms chest pain when exerting self, "when cleaning or something, have to sit down," but also occurs sometimes when resting, but confirms chest pain has been an issue she's had evaluated by a doc. Pt reporting she "can't breathe out of nose a lot at night, breathe out of mouth, lips chapped and stuff." Pt reporting she has been having trouble laying down flat, "have to sleep with few pillows," but no heart racing,  and confirms currently "sitting down" with no SOB right now. Pt confirms she is on another antibx for current pneumonia to left lower lobe of lung, since her follow-up with doc. Pt reporting saw improvement in symptoms with antibx "sometimes." Pt confirms she smokes and "want to see about smoking patches, gotta get off these cigarettes," and also wants to see about a cough med - please advise. Pt denies chest pain lasting more than 5 min, but also states that chest pain lasts "doesn't last long, 5-10 min," confirms this pain has been evaluated. Pt denies chest pain being more frequent the past few weeks, stating it "depends," but also reporting chest pain is "getting more often than it was a couple weeks ago." Advised pt be examined at hospital, confirms pt will have someone drive her there, advised call 911 if worsening. Pt verbalized understanding. Pt requesting that any call back today about smoking patches or cough med be to her friend's house where she's staying, "he has a phone," friend Sandria Manly # [phone] or cell # [phone]. Pt also requesting office "tell Constance Goltz her aunt says hello." Pt confirms she will have friend drive her to ED.  Reason for Disposition  [1] Chest pain (or "angina") comes and goes AND [2] is happening more often (increasing in frequency) or getting worse (increasing in severity)  (Exception: Chest pains that last only a few seconds.)  Answer Assessment - Initial Assessment Questions 1. RESPIRATORY STATUS: "Describe your breathing?" (e.g., wheezing, shortness of breath, unable to speak, severe coughing)      Can't breathe sometimes 2. ONSET: "When did this breathing problem begin?"      Been happening  for last couple weeks, worsened recently, weather is some of it 3. PATTERN "Does the difficult breathing come and go, or has it been constant since it started?"      Sometimes it comes when resting and sometimes when walking 4. SEVERITY: "How bad is your  breathing?" (e.g., mild, moderate, severe)    - MILD: No SOB at rest, mild SOB with walking, speaks normally in sentences, can lie down, no retractions, pulse < 100.    - MODERATE: SOB at rest, SOB with minimal exertion and prefers to sit, cannot lie down flat, speaks in phrases, mild retractions, audible wheezing, pulse 100-120.    - SEVERE: Very SOB at rest, speaks in single words, struggling to breathe, sitting hunched forward, retractions, pulse > 120      Trouble laying down flat, no heart racing, sitting down doing okay right now, no SOB right now 5. RECURRENT SYMPTOM: "Have you had difficulty breathing before?" If Yes, ask: "When was the last time?" and "What happened that time?"      yes 6. CARDIAC HISTORY: "Do you have any history of heart disease?" (e.g., heart attack, angina, bypass surgery, angioplasty)      hypertension 7. LUNG HISTORY: "Do you have any history of lung disease?"  (e.g., pulmonary embolus, asthma, emphysema)     Smokes cigarettes 9. OTHER SYMPTOMS: "Do you have any other symptoms? (e.g., dizziness, runny nose, cough, chest pain, fever)     Sore throat using chloraseptic spray, still on antibx  Answer Assessment - Initial Assessment Questions 1. LOCATION: "Where does it hurt?"       Left side chest 3. ONSET: "When did the chest pain begin?" (Minutes, hours or days)      Been going on 4. PATTERN: "Does the pain come and go, or has it been constant since it started?"  "Does it get worse with exertion?"      Comes and goes, sometimes with exertion sometimes at rest 5. DURATION: "How long does it last" (e.g., seconds, minutes, hours)     5-10 min 7. CARDIAC RISK FACTORS: "Do you have any history of heart problems or risk factors for heart disease?" (e.g., angina, prior heart attack; diabetes, high blood pressure, high cholesterol, smoker, or strong family history of heart disease)     HTN, chest pain been evaluated in past but getting more often than it was a couple  weeks ago 8. PULMONARY RISK FACTORS: "Do you have any history of lung disease?"  (e.g., blood clots in lung, asthma, emphysema, birth control pills)     Denies, smokes cigarettes  Protocols used: Breathing Difficulty-A-AH, Chest Pain-A-AH

## 2023-04-25 NOTE — ED Notes (Signed)
Pt ambulated to bathroom steady gait.

## 2023-04-26 ENCOUNTER — Other Ambulatory Visit: Payer: Self-pay | Admitting: Family Medicine

## 2023-04-26 DIAGNOSIS — F4321 Adjustment disorder with depressed mood: Secondary | ICD-10-CM

## 2023-04-26 DIAGNOSIS — J189 Pneumonia, unspecified organism: Secondary | ICD-10-CM

## 2023-04-26 DIAGNOSIS — F419 Anxiety disorder, unspecified: Secondary | ICD-10-CM

## 2023-04-26 DIAGNOSIS — I10 Essential (primary) hypertension: Secondary | ICD-10-CM

## 2023-04-26 MED ORDER — SERTRALINE HCL 50 MG PO TABS: 50.0000 mg | ORAL_TABLET | Freq: Every day | ORAL | 0 refills | Status: DC

## 2023-04-26 MED ORDER — AMLODIPINE BESYLATE 5 MG PO TABS: 5.0000 mg | ORAL_TABLET | Freq: Every day | ORAL | 0 refills | Status: DC

## 2023-04-26 NOTE — Telephone Encounter (Signed)
 Last Fill: Amlodipine: 12/28/22     Benzonatate: 04/18/23    Sertraline: 12/28/22  Last OV: 04/18/23 Next OV: 05/03/23  Routing to provider for review/authorization.

## 2023-04-26 NOTE — Telephone Encounter (Signed)
 Copied from CRM [id number]. Topic: Clinical - Medication Refill >> Apr 26, 2023  8:24 AM [name] wrote: Most Recent Primary Care Visit:  Provider: Gabriel Earing  Department: [facility]  Visit Type: MYCHART VIDEO VISIT  Date: 04/18/2023  Medication: amLODipine (NORVASC) 5 MG tablet ,benzonatate (TESSALON PERLES) 100 MG capsule, and sertraline (ZOLOFT) 50 MG tablet  Has the patient contacted their pharmacy? No   Is this the correct pharmacy for this prescription? Yes  This is the patient's preferred pharmacy:  [facility] [address], [place] - [address] [place] [address] Phone: [phone] Fax: [phone]    Has the prescription been filled recently? No  Is the patient out of the medication? Yes  Has the patient been seen for an appointment in the last year OR does the patient have an upcoming appointment? Yes  Can we respond through MyChart? No. Patient said if we have to call, please call her friend Markus Daft phone at [phone](home) or [phone](mobile)  Agent: Please be advised that Rx refills may take up to 3 business days. We ask that you follow-up with your pharmacy.

## 2023-04-29 ENCOUNTER — Ambulatory Visit (INDEPENDENT_AMBULATORY_CARE_PROVIDER_SITE_OTHER): Admitting: Family Medicine

## 2023-04-29 ENCOUNTER — Ambulatory Visit: Payer: Self-pay | Admitting: Family Medicine

## 2023-04-29 ENCOUNTER — Encounter: Payer: Self-pay | Admitting: Family Medicine

## 2023-04-29 VITALS — BP 131/72 | HR 82 | Temp 97.9°F | Ht 63.0 in | Wt 107.2 lb

## 2023-04-29 DIAGNOSIS — Z720 Tobacco use: Secondary | ICD-10-CM | POA: Diagnosis not present

## 2023-04-29 DIAGNOSIS — L299 Pruritus, unspecified: Secondary | ICD-10-CM

## 2023-04-29 DIAGNOSIS — J441 Chronic obstructive pulmonary disease with (acute) exacerbation: Secondary | ICD-10-CM

## 2023-04-29 MED ORDER — LEVOCETIRIZINE DIHYDROCHLORIDE 5 MG PO TABS: 5.0000 mg | ORAL_TABLET | Freq: Every evening | ORAL | 1 refills | Status: DC

## 2023-04-29 MED ORDER — NICOTINE 14 MG/24HR TD PT24: 14.0000 mg | MEDICATED_PATCH | Freq: Every day | TRANSDERMAL | 0 refills | Status: DC

## 2023-04-29 NOTE — Telephone Encounter (Signed)
 Appt today

## 2023-04-29 NOTE — Patient Instructions (Signed)

## 2023-04-29 NOTE — Progress Notes (Signed)
 Acute Office Visit  Subjective:     Patient ID: Tammy Proctor, female    DOB: 1955/05/13, 68 y.o.   MRN: [id number]  Chief Complaint  Patient presents with   Pruritis    HPI Patient is in today for itching for 3 days. On arms, legs, back. No rash. Skin has been dry and felt chapped. No skin breakdown. She has been using OTC hydrocortisone cream with improvement. Itching started a few days after starting prednisone for COPD exacerbation. She has been on prednisone recently without any reaction.   Would like Rx for nicotine patches to help her stop smoking. She recent COPD exacerbation, she has now cut back to 6 cigarettes a day. Was smoking 1/2 PPD prior to the last week for 40-50 years. Has tried nicotine gum but felt sick.   Reports improved shortness of breath, cough, and wheezing. Denies fever. Has appt with pulmonology to establish next month.   ROS As per HPI.      Objective:    BP 131/72   Pulse 82   Temp 97.9 F (36.6 C) (Temporal)   Ht 5\' 3"  (1.6 m)   Wt 107 lb 3.2 oz (48.6 kg)   SpO2 100%   BMI 18.99 kg/m    Physical Exam Vitals and nursing note reviewed.  Constitutional:      General: She is not in acute distress.    Appearance: Normal appearance. She is not ill-appearing, toxic-appearing or diaphoretic.  HENT:     Mouth/Throat:     Mouth: Mucous membranes are moist.     Pharynx: Oropharynx is clear.  Cardiovascular:     Rate and Rhythm: Normal rate and regular rhythm.     Heart sounds: Normal heart sounds. No murmur heard. Pulmonary:     Effort: Pulmonary effort is normal. No respiratory distress.     Breath sounds: Normal breath sounds. No wheezing, rhonchi or rales.  Chest:     Chest wall: No tenderness.  Musculoskeletal:     Cervical back: Neck supple. No rigidity.     Right lower leg: No edema.     Left lower leg: No edema.  Skin:    General: Skin is warm and dry.     Findings: No erythema, lesion or rash.  Neurological:     General:  No focal deficit present.     Mental Status: She is alert and oriented to person, place, and time.  Psychiatric:        Mood and Affect: Mood normal.        Behavior: Behavior normal.     No results found for any visits on 04/29/23.      Assessment & Plan:   Zelda was seen today for pruritis.  Diagnoses and all orders for this visit:  Pruritus Dry skin. Discussed moisturizing skin. Try xyzal as below.  -     levocetirizine (XYZAL) 5 MG tablet; Take 1 tablet (5 mg total) by mouth every evening.  Tobacco abuse 5 minutes of smoking cessation instruction/counseling given:  counseled patient on the dangers of tobacco use, advised patient to stop smoking, and reviewed strategies to maximize success. Patches discussed and ordered as below. Discussed chantix if patches are not covered by insurance. Also discussed 1-800-Quit.  -     nicotine (NICODERM CQ - DOSED IN MG/24 HOURS) 14 mg/24hr patch; Place 1 patch (14 mg total) onto the skin daily.  Chronic obstructive pulmonary disease with acute exacerbation (HCC) Improving symptoms. Continue prednisone as prescribed.  Keep hospital follow up appt.   Return to office for new or worsening symptoms, or if symptoms persist.   The patient indicates understanding of these issues and agrees with the plan.  Gabriel Earing, FNP

## 2023-04-29 NOTE — Telephone Encounter (Signed)
  Chief Complaint: itching  Symptoms: itching, slight redness, sweating, wheezing Frequency: since pt was prescribed prednisone at 2/27 ED visit Pertinent Negatives: Patient denies fever, CP, SOB Disposition: [] ED /[] Urgent Care (no appt availability in office) / [x] Appointment(In office/virtual)/ []  [place] [facility]/ [] Home Care/ [] Refused Recommended Disposition /[] [place] [facility]/ []  Follow-up with PCP Additional Notes: Pt reports itching to her back, legs, and arms. Pt seen 2/27 in the ED for COPD exacerbation and prescribed prednisone. [facility] was closed that day so she started the medication "a few days after." Pt denies hives or rash but states there is some redness. Denies difficulty breathing other than wheezing in the AM. Denies that her throat feels it is closing up. Denies tongue swelling. Pt endorses waking up with a sweaty chest and pillow in the AM. Denies CP. Has not tried OTC Zyrtec/Benadryl but has tried OTC anti-itch lotion which she states is effective. Endorses nausea but no vomiting and no diarrhea. Denies using new soaps or detergents. Pt concerned this could be an allergy to prednisone as she states she has never taken it. Per protocol pt scheduled today in office at 3pm. RN advised pt she needs to call us back if she gets worse or if her symptoms change, she verbalized understanding. RN advised pt she needs to call 911 if she has CP or difficulty breathing, she verbalized understanding.   Copied from CRM [id number]. Topic: Clinical - Red Word Triage >> Apr 29, 2023 10:38 AM Gaetano Hawthorne wrote: Red Word that prompted transfer to Nurse Triage: Patient experiencing some itchiness on back and arms - she went to the ER last week and was prescribed some steriods - she is going to follow up with PCP on Friday, 03/07. Reason for Disposition  [1] MODERATE-SEVERE widespread itching (i.e., interferes with sleep, normal activities or school) AND [2] not improved after 24 hours of  itching Care Advice  Answer Assessment - Initial Assessment Questions 1. DESCRIPTION: "Describe the itching you are having."     Back, arms, legs 2. SEVERITY: "How bad is it?"    - MILD: Doesn't interfere with normal activities.   - MODERATE-SEVERE: Interferes with work, school, sleep, or other activities.      "I want to take my finger nails and just claw", "it is mild, I can tolerate it" 3. SCRATCHING: "Are there any scratch marks? Bleeding?"     No 4. ONSET: "When did this begin?"      Prescribed prednisone 2/27 for COPD exacerbation, states itching began with prednisone. Did not start medication until "a few days after" (pharmacy closed etc)  5. CAUSE: "What do you think is causing the itching?" (ask about swimming pools, pollen, animals, soaps, etc.)     Prednisone 6. OTHER SYMPTOMS: "Do you have any other symptoms?"      Itching with a little redness, no rash. "Still doing inhaler and medicines" "my mouth and lips are chapped because I breathe out of my mouth", denies airway tightening/swallowing/difficulty breathing, still "coughing up a bit of stuff" (clear mucus). Pt states she wakes up sweaty, states her chest and pillow is wet with sweat and she usually does not have night sweats. No fever. Endorses nausea, no diarrhea. No vomiting. No CP. Not using any new soaps or detergents. Not using OTC Zyrtec/benadryl, is using anti-itch lotion OTC. Endorses wheezing in the AM with sweating and itching.  Protocols used: Itching - Widespread-A-AH

## 2023-05-03 ENCOUNTER — Encounter: Payer: Self-pay | Admitting: Family Medicine

## 2023-05-03 ENCOUNTER — Ambulatory Visit (INDEPENDENT_AMBULATORY_CARE_PROVIDER_SITE_OTHER): Payer: Medicare HMO | Admitting: Family Medicine

## 2023-05-03 VITALS — BP 132/65 | HR 77 | Temp 98.1°F | Ht 63.0 in | Wt 106.0 lb

## 2023-05-03 DIAGNOSIS — J441 Chronic obstructive pulmonary disease with (acute) exacerbation: Secondary | ICD-10-CM

## 2023-05-03 DIAGNOSIS — Z720 Tobacco use: Secondary | ICD-10-CM | POA: Diagnosis not present

## 2023-05-03 DIAGNOSIS — J189 Pneumonia, unspecified organism: Secondary | ICD-10-CM | POA: Diagnosis not present

## 2023-05-03 DIAGNOSIS — I700 Atherosclerosis of aorta: Secondary | ICD-10-CM | POA: Diagnosis not present

## 2023-05-03 NOTE — Progress Notes (Signed)
   Established Patient Office Visit  Subjective   Patient ID: Tammy Proctor, female    DOB: [birth year]  Age: 68 y.o. MRN: [id number]  Chief Complaint  Patient presents with   COPD    HPI Tammy Proctor is here for ER follow up. She was seen at [facility] on 04/25/23 for COPD exacerbation. Presented with worsening shortness of breath shortness of breath and pleuritic chest pain. D-dimer was elevated- CT angioram negative for acute findings. CXR consistent with COPD- negative for PNA. Negative Covid/flu/RSV. She was given a duoneb with improvement. D/c home with instructions to complete prednisone and abx.   Reports feeling much better now.   Occasional wheezing. Mild cough with a little clear sputum. No shortness of breath, fever, or chest pain.   Pruritus has also resolved. Using baby oil after a shower.   Has appt to est with pulmonology on 05/29/23.    ROS As per HPI.    Objective:     BP 132/65   Pulse 77   Temp 98.1 F (36.7 C) (Temporal)   Ht 5\' 3"  (1.6 m)   Wt 106 lb (48.1 kg)   SpO2 99%   BMI 18.78 kg/m    Physical Exam Vitals and nursing note reviewed.  Constitutional:      General: She is not in acute distress.    Appearance: Normal appearance. She is not ill-appearing, toxic-appearing or diaphoretic.  Cardiovascular:     Rate and Rhythm: Normal rate and regular rhythm.     Heart sounds: Normal heart sounds. No murmur heard. Pulmonary:     Effort: Pulmonary effort is normal. No respiratory distress.     Breath sounds: Normal breath sounds. No wheezing, rhonchi or rales.  Musculoskeletal:     Cervical back: No rigidity.     Right lower leg: No edema.     Left lower leg: No edema.  Skin:    General: Skin is dry.  Neurological:     General: No focal deficit present.     Mental Status: She is alert and oriented to person, place, and time.  Psychiatric:        Mood and Affect: Mood normal.        Behavior: Behavior normal.      No results found for any visits  on 05/03/23.    The ASCVD Risk score (Arnett DK, et al., 2019) failed to calculate for the following reasons:   The valid HDL cholesterol range is 20 to 100 mg/dL    Assessment & Plan:   [name] was seen today for copd.  Diagnoses and all orders for this visit:  COPD with exacerbation [facility]) Reviewed ER note, labs, imaging. Symptoms significantly improved. Keep appt with pulmonology for further evaluation.   Community acquired pneumonia of left lower lobe of lung Discussed cleared per xray report from ER.   Tobacco abuse Plans to start patches soon.   Aortic atherosclerosis (HCC) Taking aspirin OTC. On statin.   Keep scheduled follow up appt.   The patient indicates understanding of these issues and agrees with the plan.   Gabriel Earing, FNP

## 2023-05-15 ENCOUNTER — Ambulatory Visit: Payer: Medicare HMO | Admitting: Family Medicine

## 2023-05-23 ENCOUNTER — Ambulatory Visit: Payer: Self-pay | Admitting: *Deleted

## 2023-05-23 NOTE — Telephone Encounter (Signed)
  Chief Complaint: coughing spells, wheezing at night  Symptoms: coughing spells with vomiting at times. SOB after coughing spells, coughing yellow sputum at times and other times not able to cough up phlegm. Wheezing at night upon awakening. Hx resp. issues Frequency: 1 week  Pertinent Negatives: Patient denies chest pain no difficulty breathing no fever reported. Disposition: [] ED /[] Urgent Care (no appt availability in office) / [x] Appointment(In office/virtual)/ []  [facility]/ [] Home Care/ [] Refused Recommended Disposition /[] [facility]/ []  Follow-up with PCP Additional Notes:   Appt scheduled for tomorrow.  Recommended drinking fluids and if sx worsen go to ED.      Copied from CRM [id number]. Topic: Clinical - Red Word Triage >> May 23, 2023  3:54 PM Geroge Baseman wrote: Red Word that prompted transfer to Nurse Triage: sever coughing fit Reason for Disposition  [1] MILD difficulty breathing (e.g., minimal/no SOB at rest, SOB with walking, pulse <100) AND [2] still present when not coughing  Answer Assessment - Initial Assessment Questions 1. ONSET: "When did the cough begin?"      1 week  2. SEVERITY: "How bad is the cough today?"      Coughing spells  3. SPUTUM: "Describe the color of your sputum" (none, dry cough; clear, white, yellow, green)     Yellow  4. HEMOPTYSIS: "Are you coughing up any blood?" If so ask: "How much?" (flecks, streaks, tablespoons, etc.)     Na  5. DIFFICULTY BREATHING: "Are you having difficulty breathing?" If Yes, ask: "How bad is it?" (e.g., mild, moderate, severe)    - MILD: No SOB at rest, mild SOB with walking, speaks normally in sentences, can lie down, no retractions, pulse < 100.    - MODERATE: SOB at rest, SOB with minimal exertion and prefers to sit, cannot lie down flat, speaks in phrases, mild retractions, audible wheezing, pulse 100-120.    - SEVERE: Very SOB at rest, speaks in single words, struggling to breathe, sitting  hunched forward, retractions, pulse > 120      SOB after coughing spell 6. FEVER: "Do you have a fever?" If Yes, ask: "What is your temperature, how was it measured, and when did it start?"     na 7. CARDIAC HISTORY: "Do you have any history of heart disease?" (e.g., heart attack, congestive heart failure)      Na  8. LUNG HISTORY: "Do you have any history of lung disease?"  (e.g., pulmonary embolus, asthma, emphysema)     Hx  9. PE RISK FACTORS: "Do you have a history of blood clots?" (or: recent major surgery, recent prolonged travel, bedridden)     na 10. OTHER SYMPTOMS: "Do you have any other symptoms?" (e.g., runny nose, wheezing, chest pain)       Coughing spells wheezing at night  11. PREGNANCY: "Is there any chance you are pregnant?" "When was your last menstrual period?"       na 12. TRAVEL: "Have you traveled out of the country in the last month?" (e.g., travel history, exposures)       na  Protocols used: Cough - Acute Productive-A-AH

## 2023-05-24 ENCOUNTER — Ambulatory Visit: Admitting: Family Medicine

## 2023-05-26 NOTE — Progress Notes (Deleted)
 Tammy Proctor, female    DOB: Jun 28, 1955    MRN: [id number]   Brief patient profile:  68  yo*** *** referred to pulmonary clinic in [place]  05/29/2023 by *** for ***   Not seen prev    History of Present Illness  05/29/2023  Pulmonary/ 1st office eval/ [facility] / [place] [facility]  No chief complaint on file.    Dyspnea:  *** Cough: *** Sleep: *** SABA use: *** 02: *** LDSCT:***  No obvious day to day or daytime pattern/variability or assoc excess/ purulent sputum or mucus plugs or hemoptysis or cp or chest tightness, subjective wheeze or overt sinus or hb symptoms.    Also denies any obvious fluctuation of symptoms with weather or environmental changes or other aggravating or alleviating factors except as outlined above   No unusual exposure hx or h/o childhood pna/ asthma or knowledge of premature birth.  Current Allergies, Complete Past Medical History, Past Surgical History, Family History, and Social History were reviewed in [employer] record.  ROS  The following are not active complaints unless bolded Hoarseness, sore throat, dysphagia, dental problems, itching, sneezing,  nasal congestion or discharge of excess mucus or purulent secretions, ear ache,   fever, chills, sweats, unintended wt loss or wt gain, classically pleuritic or exertional cp,  orthopnea pnd or arm/hand swelling  or leg swelling, presyncope, palpitations, abdominal pain, anorexia, nausea, vomiting, diarrhea  or change in bowel habits or change in bladder habits, change in stools or change in urine, dysuria, hematuria,  rash, arthralgias, visual complaints, headache, numbness, weakness or ataxia or problems with walking or coordination,  change in mood or  memory.            Outpatient Medications Prior to Visit  Medication Sig Dispense Refill   acetaminophen  (TYLENOL ) 325 MG tablet Take 2 tablets (650 mg total) by mouth every 6 (six) hours as needed for mild pain (or Fever >/=  101). 100 tablet 1   albuterol  (VENTOLIN  HFA) 108 (90 Base) MCG/ACT inhaler Inhale 2 puffs into the lungs every 6 (six) hours as needed for wheezing or shortness of breath. 8 g 2   amLODipine  (NORVASC ) 5 MG tablet Take 1 tablet (5 mg total) by mouth daily. 90 tablet 0   aspirin  EC 81 MG tablet Take 1 tablet (81 mg total) by mouth daily with breakfast. Swallow whole. (Patient not taking: Reported on 05/03/2023) 30 tablet 11   atorvastatin  (LIPITOR) 40 MG tablet Take 1 tablet (40 mg total) by mouth daily. 90 tablet 1   benzonatate  (TESSALON  PERLES) 100 MG capsule Take 1 capsule (100 mg total) by mouth 3 (three) times daily as needed. 20 capsule 0   fluticasone  (FLONASE ) 50 MCG/ACT nasal spray Use 2 spray(s) in each nostril once daily 48 g 1   levocetirizine (XYZAL ) 5 MG tablet Take 1 tablet (5 mg total) by mouth every evening. 30 tablet 1   naproxen sodium (ALEVE) 220 MG tablet Take 220 mg by mouth.     nicotine  (NICODERM CQ  - DOSED IN MG/24 HOURS) 14 mg/24hr patch Place 1 patch (14 mg total) onto the skin daily. (Patient not taking: Reported on 05/03/2023) 28 patch 0   sertraline  (ZOLOFT ) 50 MG tablet Take 1 tablet (50 mg total) by mouth daily. 90 tablet 0   No facility-administered medications prior to visit.    Past Medical History:  Diagnosis Date   Anxiety    Depression    HLD (hyperlipidemia)  Hypertension    Migraine    resolved      Objective:     There were no vitals taken for this visit.         Assessment   No problem-specific Assessment & Plan notes found for this encounter.     Vernestine Gondola, MD 05/26/2023

## 2023-05-29 ENCOUNTER — Ambulatory Visit: Payer: Medicare HMO | Admitting: Internal Medicine

## 2023-05-29 ENCOUNTER — Encounter: Payer: Self-pay | Admitting: Family Medicine

## 2023-05-29 ENCOUNTER — Encounter: Payer: Self-pay | Admitting: Internal Medicine

## 2023-05-29 ENCOUNTER — Ambulatory Visit (INDEPENDENT_AMBULATORY_CARE_PROVIDER_SITE_OTHER): Admitting: Family Medicine

## 2023-05-29 VITALS — BP 112/58 | HR 75 | Temp 97.5°F | Ht 63.0 in | Wt 106.0 lb

## 2023-05-29 DIAGNOSIS — M67449 Ganglion, unspecified hand: Secondary | ICD-10-CM | POA: Diagnosis not present

## 2023-05-29 DIAGNOSIS — J449 Chronic obstructive pulmonary disease, unspecified: Secondary | ICD-10-CM | POA: Diagnosis not present

## 2023-05-29 DIAGNOSIS — J301 Allergic rhinitis due to pollen: Secondary | ICD-10-CM | POA: Diagnosis not present

## 2023-05-29 DIAGNOSIS — H6692 Otitis media, unspecified, left ear: Secondary | ICD-10-CM

## 2023-05-29 MED ORDER — AMOXICILLIN-POT CLAVULANATE 875-125 MG PO TABS: 1.0000 | ORAL_TABLET | Freq: Two times a day (BID) | ORAL | 0 refills | Status: AC

## 2023-05-29 NOTE — Progress Notes (Signed)
 Acute Office Visit  Subjective:     Patient ID: Tammy Proctor, female    DOB: 07/29/1955, 68 y.o.   MRN: [id number]  Chief Complaint  Patient presents with   Sinusitis    Sinusitis This is a new problem. Episode onset: 2 weeks. The problem has been waxing and waning since onset. There has been no fever. Associated symptoms include coughing, ear pain (left pressure), sinus pressure (frontal), sneezing and a sore throat (irritation). Pertinent negatives include no chills, congestion or shortness of breath. (Runny, watery eyes) Past treatments include nothing.   Reports stable COPD symptoms. Hasn't been using inhaler. Didn't go to pulmonology appt.   Pain and erythema of left pink knuckle. Has a small cyst on knuckle. No exudate. Pain when moving pinky or to palpation.   Review of Systems  Constitutional:  Negative for chills.  HENT:  Positive for ear pain (left pressure), sinus pressure (frontal), sneezing and sore throat (irritation). Negative for congestion.   Respiratory:  Positive for cough. Negative for shortness of breath.         Objective:    BP (!) 112/58   Pulse 75   Temp (!) 97.5 F (36.4 C) (Temporal)   Ht 5\' 3"  (1.6 m)   Wt 106 lb (48.1 kg)   SpO2 100%   BMI 18.78 kg/m    Physical Exam Vitals and nursing note reviewed.  Constitutional:      General: She is not in acute distress.    Appearance: Normal appearance. She is not ill-appearing, toxic-appearing or diaphoretic.  HENT:     Head: Normocephalic and atraumatic.     Right Ear: Tympanic membrane, ear canal and external ear normal.     Left Ear: Ear canal and external ear normal. A middle ear effusion is present. Tympanic membrane is erythematous.     Nose: Rhinorrhea present.     Mouth/Throat:     Mouth: Mucous membranes are moist.     Pharynx: Oropharynx is clear. No oropharyngeal exudate or posterior oropharyngeal erythema.  Eyes:     General:        Right eye: No discharge.        Left  eye: No discharge.     Conjunctiva/sclera: Conjunctivae normal.  Cardiovascular:     Rate and Rhythm: Normal rate and regular rhythm.     Heart sounds: Normal heart sounds. No murmur heard. Pulmonary:     Effort: Pulmonary effort is normal. No respiratory distress.     Breath sounds: Normal breath sounds. No wheezing, rhonchi or rales.  Musculoskeletal:     Cervical back: Neck supple. No rigidity.     Right lower leg: No edema.     Left lower leg: No edema.     Comments: Mucus cyst to DIP of left little finger. Mild erythema. No exudate. No fluctuance.   Skin:    General: Skin is warm and dry.  Neurological:     General: No focal deficit present.     Mental Status: She is alert and oriented to person, place, and time.  Psychiatric:        Mood and Affect: Mood normal.        Behavior: Behavior normal.     No results found for any visits on 05/29/23.      Assessment & Plan:   Marlies was seen today for sinusitis.  Diagnoses and all orders for this visit:  Acute otitis media, left Augmentin as below. Tylenol prn.  -  amoxicillin-clavulanate (AUGMENTIN) 875-125 MG tablet; Take 1 tablet by mouth 2 (two) times daily for 7 days.  Seasonal allergic rhinitis due to pollen Start xyzal as below.   Chronic obstructive pulmonary disease, unspecified COPD type (HCC) Stable. Discussed compliance with inhalers.   Digital mucinous cyst of finger Will refer to ortho if pain persists. Discussed that augmentin should take care of any potential cellulitis as it is mildly erythematous.   Return if symptoms worsen or fail to improve.  The patient indicates understanding of these issues and agrees with the plan.  Gabriel Earing, FNP

## 2023-06-21 ENCOUNTER — Ambulatory Visit: Payer: Self-pay

## 2023-06-21 NOTE — Telephone Encounter (Signed)
 Copied from CRM [id number]. Topic: Clinical - Red Word Triage >> Jun 21, 2023  8:41 AM Donald Frost wrote: Red Word that prompted transfer to Nurse Triage: The patient called in stating she has had really bad tooth pain. She hasn't seen a dentist in quite a long time and she doesn't know what to do. She bought some numbing cream but she still is in pain. I will transfer her to E2C2 NT    Chief Complaint: Toothache Symptoms: Pain Frequency: Today Pertinent Negatives: Patient denies fever or swelling Disposition: [] ED /[] Urgent Care (no appt availability in office) / [] Appointment(In office/virtual)/ []  [facility]/ [] Home Care/ [] Refused Recommended Disposition /[] [facility]/ []  Follow-up with PCP Additional Notes: No availability today, will call back Monday. Will go to UC/ED for worsening of symptoms.  Reason for Disposition  [1] SEVERE pain (e.g., excruciating, unable to eat, unable to do any normal activities) AND [2] not improved 2 hours after pain medicine  Answer Assessment - Initial Assessment Questions 1. LOCATION: "Which tooth is hurting?"  (e.g., right-side/left-side, upper/lower, front/back)     Right bottom tooth in back 2. ONSET: "When did the toothache start?"  (e.g., hours, days)      This morning 3. SEVERITY: "How bad is the toothache?"  (Scale 1-10; mild, moderate or severe)   - MILD (1-3): doesn't interfere with chewing    - MODERATE (4-7): interferes with chewing, interferes with normal activities, awakens from sleep     - SEVERE (8-10): unable to eat, unable to do any normal activities, excruciating pain        Now - 8 4. SWELLING: "Is there any visible swelling of your face?"     No 5. OTHER SYMPTOMS: "Do you have any other symptoms?" (e.g., fever)     Red in mouth 6. PREGNANCY: "Is there any chance you are pregnant?" "When was your last menstrual period?"     no  Protocols used: Toothache-A-AH

## 2023-06-21 NOTE — Telephone Encounter (Signed)
Noted  -LS

## 2023-06-27 ENCOUNTER — Ambulatory Visit: Payer: Medicare HMO | Admitting: Family Medicine

## 2023-06-27 ENCOUNTER — Encounter: Payer: Self-pay | Admitting: Family Medicine

## 2023-08-04 NOTE — Progress Notes (Deleted)
 Tammy Proctor, female    DOB: [birth year]   MRN: [id number]   Brief patient profile:  68 yo***  *** referred to pulmonary clinic 08/07/2023 by *** for ***      Pt not previously seen by PCCM service.    History of Present Illness  08/07/2023  Pulmonary/ 1st office eval/[name]  No chief complaint on file.    Dyspnea:  *** Cough: *** Sleep: *** SABA use: *** 02 use:*** LDSCT:***  No obvious day to day or daytime pattern/variability or assoc excess/ purulent sputum or mucus plugs or hemoptysis or cp or chest tightness, subjective wheeze or overt sinus or hb symptoms.    Also denies any obvious fluctuation of symptoms with weather or environmental changes or other aggravating or alleviating factors except as outlined above   No unusual exposure hx or h/o childhood pna/ asthma or knowledge of premature birth.  Current Allergies, Complete Past Medical History, Past Surgical History, Family History, and Social History were reviewed in [employer] record.  ROS  The following are not active complaints unless bolded Hoarseness, sore throat, dysphagia, dental problems, itching, sneezing,  nasal congestion or discharge of excess mucus or purulent secretions, ear ache,   fever, chills, sweats, unintended wt loss or wt gain, classically pleuritic or exertional cp,  orthopnea pnd or arm/hand swelling  or leg swelling, presyncope, palpitations, abdominal pain, anorexia, nausea, vomiting, diarrhea  or change in bowel habits or change in bladder habits, change in stools or change in urine, dysuria, hematuria,  rash, arthralgias, visual complaints, headache, numbness, weakness or ataxia or problems with walking or coordination,  change in mood or  memory.             Outpatient Medications Prior to Visit  Medication Sig Dispense Refill   acetaminophen  (TYLENOL ) 325 MG tablet Take 2 tablets (650 mg total) by mouth every 6 (six) hours as needed for mild pain (or Fever >/=  101). 100 tablet 1   albuterol  (VENTOLIN  HFA) 108 (90 Base) MCG/ACT inhaler Inhale 2 puffs into the lungs every 6 (six) hours as needed for wheezing or shortness of breath. 8 g 2   amLODipine  (NORVASC ) 5 MG tablet Take 1 tablet (5 mg total) by mouth daily. 90 tablet 0   aspirin  EC 81 MG tablet Take 1 tablet (81 mg total) by mouth daily with breakfast. Swallow whole. 30 tablet 11   atorvastatin  (LIPITOR) 40 MG tablet Take 1 tablet (40 mg total) by mouth daily. 90 tablet 1   fluticasone  (FLONASE ) 50 MCG/ACT nasal spray Use 2 spray(s) in each nostril once daily (Patient not taking: Reported on 05/29/2023) 48 g 1   levocetirizine (XYZAL ) 5 MG tablet Take 1 tablet (5 mg total) by mouth every evening. (Patient not taking: Reported on 05/29/2023) 30 tablet 1   naproxen  sodium (ALEVE ) 220 MG tablet Take 220 mg by mouth.     sertraline  (ZOLOFT ) 50 MG tablet Take 1 tablet (50 mg total) by mouth daily. 90 tablet 0   No facility-administered medications prior to visit.    Past Medical History:  Diagnosis Date   Anxiety    Depression    HLD (hyperlipidemia)    Hypertension    Migraine    resolved      Objective:     There were no vitals taken for this visit.         Assessment   No problem-specific Assessment & Plan notes found for this encounter.  Ozell America, MD 08/04/2023

## 2023-08-05 ENCOUNTER — Other Ambulatory Visit: Payer: Self-pay | Admitting: Family Medicine

## 2023-08-05 DIAGNOSIS — R0602 Shortness of breath: Secondary | ICD-10-CM

## 2023-08-05 DIAGNOSIS — F419 Anxiety disorder, unspecified: Secondary | ICD-10-CM

## 2023-08-05 DIAGNOSIS — E782 Mixed hyperlipidemia: Secondary | ICD-10-CM

## 2023-08-05 DIAGNOSIS — L299 Pruritus, unspecified: Secondary | ICD-10-CM

## 2023-08-05 DIAGNOSIS — F4321 Adjustment disorder with depressed mood: Secondary | ICD-10-CM

## 2023-08-05 DIAGNOSIS — I10 Essential (primary) hypertension: Secondary | ICD-10-CM

## 2023-08-05 MED ORDER — ATORVASTATIN CALCIUM 40 MG PO TABS: 40.0000 mg | ORAL_TABLET | Freq: Every day | ORAL | 1 refills | Status: DC

## 2023-08-05 MED ORDER — ASPIRIN 81 MG PO TBEC: 81.0000 mg | DELAYED_RELEASE_TABLET | Freq: Every day | ORAL | 5 refills | Status: DC

## 2023-08-05 MED ORDER — LEVOCETIRIZINE DIHYDROCHLORIDE 5 MG PO TABS
5.0000 mg | ORAL_TABLET | Freq: Every evening | ORAL | 5 refills | Status: AC
Start: 2023-08-05 — End: ?

## 2023-08-05 MED ORDER — SERTRALINE HCL 50 MG PO TABS: 50.0000 mg | ORAL_TABLET | Freq: Every day | ORAL | 0 refills | Status: DC

## 2023-08-05 MED ORDER — NAPROXEN SODIUM 220 MG PO TABS: 220.0000 mg | ORAL_TABLET | Freq: Two times a day (BID) | ORAL | 0 refills | Status: DC | PRN

## 2023-08-05 MED ORDER — FLUTICASONE PROPIONATE 50 MCG/ACT NA SUSP: 2.0000 | Freq: Every day | NASAL | 1 refills | Status: DC

## 2023-08-05 MED ORDER — ALBUTEROL SULFATE HFA 108 (90 BASE) MCG/ACT IN AERS: 2.0000 | INHALATION_SPRAY | Freq: Four times a day (QID) | RESPIRATORY_TRACT | 2 refills | Status: AC | PRN

## 2023-08-05 NOTE — Telephone Encounter (Signed)
 Copied from CRM [id number]. Topic: Clinical - Medication Refill >> Aug 05, 2023  1:05 PM Tammy Proctor C wrote: Medication:  aspirin  EC 81 MG tablet albuterol  (VENTOLIN  HFA) 108 (90 Base) MCG/ACT inhaler atorvastatin  (LIPITOR) 40 MG tablet fluticasone  (FLONASE ) 50 MCG/ACT nasal spray levocetirizine (XYZAL ) 5 MG tablet  naproxen sodium (ALEVE) 220 MG tablet sertraline  (ZOLOFT ) 50 MG tablet   Has the patient contacted their pharmacy? No (Agent: If no, request that the patient contact the pharmacy for the refill. If patient does not wish to contact the pharmacy document the reason why and proceed with request.) (Agent: If yes, when and what did the pharmacy advise?)  This is the patient's preferred pharmacy:  [id number] - [place], [place] - [address] [place] [address] Phone: [phone] Fax: [phone]  Is this the correct pharmacy for this prescription? Yes If no, delete pharmacy and type the correct one.   Has the prescription been filled recently? Yes  Is the patient out of the medication? Yes  Has the patient been seen for an appointment in the last year OR does the patient have an upcoming appointment? Yes  Can we respond through MyChart? No  Agent: Please be advised that Rx refills may take up to 3 business days. We ask that you follow-up with your pharmacy.

## 2023-08-06 ENCOUNTER — Other Ambulatory Visit: Payer: Self-pay | Admitting: Family Medicine

## 2023-08-06 DIAGNOSIS — Z0000 Encounter for general adult medical examination without abnormal findings: Secondary | ICD-10-CM

## 2023-08-07 ENCOUNTER — Ambulatory Visit: Admitting: Internal Medicine

## 2023-08-19 ENCOUNTER — Ambulatory Visit (INDEPENDENT_AMBULATORY_CARE_PROVIDER_SITE_OTHER): Admitting: Family Medicine

## 2023-08-19 ENCOUNTER — Ambulatory Visit: Payer: Self-pay

## 2023-08-19 ENCOUNTER — Encounter: Payer: Self-pay | Admitting: Family Medicine

## 2023-08-19 VITALS — BP 120/64 | HR 78 | Temp 97.9°F | Ht 63.0 in | Wt 108.8 lb

## 2023-08-19 DIAGNOSIS — J441 Chronic obstructive pulmonary disease with (acute) exacerbation: Secondary | ICD-10-CM | POA: Diagnosis not present

## 2023-08-19 DIAGNOSIS — J0100 Acute maxillary sinusitis, unspecified: Secondary | ICD-10-CM | POA: Diagnosis not present

## 2023-08-19 MED ORDER — AMOXICILLIN-POT CLAVULANATE 875-125 MG PO TABS: 1.0000 | ORAL_TABLET | Freq: Two times a day (BID) | ORAL | 0 refills | Status: AC

## 2023-08-19 MED ORDER — PREDNISONE 20 MG PO TABS: 20.0000 mg | ORAL_TABLET | Freq: Every day | ORAL | 0 refills | Status: AC

## 2023-08-19 NOTE — Telephone Encounter (Signed)
 E2C2 scheduled appointment.

## 2023-08-19 NOTE — Telephone Encounter (Signed)
   FYI Only or Action Required?: FYI only for provider.  Patient was last seen in primary care on 05/29/2023 by Joesph Annabella HERO, FNP. Called Nurse Triage reporting Generalized Body Aches. Symptoms began several days ago. Interventions attempted: Nothing. Symptoms are: unchanged.  Triage Disposition: See PCP When Office is Open (Within 3 Days)  Patient/caregiver understands and will follow disposition?: Yes      Copied from CRM [id number]. Topic: Clinical - Red Word Triage >> Aug 19, 2023  8:21 AM [name] wrote: [facility] that prompted transfer to Nurse Triage: Patient stated since Friday she has body aches, fatigue,coughing with mucus,diarrhea Reason for Disposition  [1] MODERATE pain (e.g., interferes with normal activities) AND [2] present > 3 days  Answer Assessment - Initial Assessment Questions 1. ONSET: When did the muscle aches or body pains start?      Friday 2. LOCATION: What part of your body is hurting? (e.g., entire body, arms, legs)      Head to toe 3. SEVERITY: How bad is the pain? (Scale 1-10; or mild, moderate, severe)   - MILD (1-3): doesn't interfere with normal activities    - MODERATE (4-7): interferes with normal activities or awakens from sleep    - SEVERE (8-10):  excruciating pain, unable to do any normal activities      Moderate to severe 4. CAUSE: What do you think is causing the pains?     Don't know 5. FEVER: Have you been having fever?     no 6. OTHER SYMPTOMS: Do you have any other symptoms? (e.g., chest pain, weakness, rash, cold or flu symptoms, weight loss)     Coughing, diarrhea 7. PREGNANCY: Is there any chance you are pregnant? When was your last menstrual period?     na 8. TRAVEL: Have you traveled out of the country in the last month? (e.g., travel history, exposures)     no  Protocols used: Muscle Aches and Body Pain-A-AH

## 2023-08-19 NOTE — Progress Notes (Signed)
 Acute Office Visit  Subjective:     Patient ID: Tammy Proctor, female    DOB: [birth year], 68 y.o.   MRN: [id number]  Chief Complaint  Patient presents with   Generalized Body Aches    Cough This is a new problem. The current episode started in the past 7 days. The problem has been unchanged. The cough is Productive of sputum (yellow). Associated symptoms include ear pain (right), headaches, myalgias, nasal congestion, shortness of breath (when outside) and wheezing. Pertinent negatives include no chest pain, chills, fever, hemoptysis or sore throat. Associated symptoms comments: tired. Risk factors for lung disease include smoking/tobacco exposure. She has tried nothing for the symptoms. Her past medical history is significant for COPD.    Review of Systems  Constitutional:  Negative for chills and fever.  HENT:  Positive for ear pain (right). Negative for sore throat.   Respiratory:  Positive for cough, shortness of breath (when outside) and wheezing. Negative for hemoptysis.   Cardiovascular:  Negative for chest pain.  Musculoskeletal:  Positive for myalgias.  Neurological:  Positive for headaches.        Objective:    BP 120/64   Pulse 78   Temp 97.9 F (36.6 C) (Temporal)   Ht 5' 3 (1.6 m)   Wt 108 lb 12.8 oz (49.4 kg)   SpO2 96%   BMI 19.27 kg/m    Physical Exam Vitals and nursing note reviewed.  Constitutional:      General: She is not in acute distress.    Appearance: Normal appearance. She is not ill-appearing, toxic-appearing or diaphoretic.  HENT:     Right Ear: Tympanic membrane, ear canal and external ear normal.     Left Ear: Tympanic membrane, ear canal and external ear normal.     Nose: Congestion present.     Right Sinus: Maxillary sinus tenderness present. No frontal sinus tenderness.     Left Sinus: Maxillary sinus tenderness present. No frontal sinus tenderness.     Mouth/Throat:     Mouth: Mucous membranes are moist.     Pharynx:  Oropharynx is clear. No oropharyngeal exudate or posterior oropharyngeal erythema.   Eyes:     General:        Right eye: No discharge.        Left eye: No discharge.     Conjunctiva/sclera: Conjunctivae normal.    Cardiovascular:     Rate and Rhythm: Normal rate and regular rhythm.     Heart sounds: Normal heart sounds. No murmur heard. Pulmonary:     Effort: Pulmonary effort is normal. No respiratory distress.     Breath sounds: Normal breath sounds. No stridor. No wheezing, rhonchi or rales.  Chest:     Chest wall: No tenderness.   Musculoskeletal:     Cervical back: Neck supple. No rigidity.     Right lower leg: No edema.     Left lower leg: No edema.   Neurological:     General: No focal deficit present.     Mental Status: She is alert and oriented to person, place, and time.   Psychiatric:        Mood and Affect: Mood normal.        Behavior: Behavior normal.     No results found for any visits on 08/19/23.      Assessment & Plan:   Margaurite was seen today for generalized body aches.  Diagnoses and all orders for this visit:  Acute non-recurrent maxillary  sinusitis -     amoxicillin -clavulanate (AUGMENTIN ) 875-125 MG tablet; Take 1 tablet by mouth 2 (two) times daily for 7 days.  COPD with exacerbation (HCC) -     predniSONE  (DELTASONE ) 20 MG tablet; Take 1 tablet (20 mg total) by mouth daily with breakfast for 5 days. -     amoxicillin -clavulanate (AUGMENTIN ) 875-125 MG tablet; Take 1 tablet by mouth 2 (two) times daily for 7 days.  Augmentin  as above with prednisone  burst. Mucinex BID for cough. Continue xyzal , albuterol  prn. Declined maintenance inhaler. Return to office for new or worsening symptoms, or if symptoms persist.   The patient indicates understanding of these issues and agrees with the plan.  Annabella CHRISTELLA Search, FNP

## 2023-09-02 ENCOUNTER — Encounter: Payer: Self-pay | Admitting: Nurse Practitioner

## 2023-09-02 ENCOUNTER — Ambulatory Visit (INDEPENDENT_AMBULATORY_CARE_PROVIDER_SITE_OTHER): Admitting: Nurse Practitioner

## 2023-09-02 VITALS — BP 133/73 | HR 76 | Temp 97.5°F | Ht 63.0 in | Wt 110.0 lb

## 2023-09-02 DIAGNOSIS — R051 Acute cough: Secondary | ICD-10-CM | POA: Diagnosis not present

## 2023-09-02 MED ORDER — BENZONATATE 100 MG PO CAPS
100.0000 mg | ORAL_CAPSULE | Freq: Three times a day (TID) | ORAL | 0 refills | Status: DC | PRN
Start: 2023-09-02 — End: 2023-12-02

## 2023-09-02 MED ORDER — AZITHROMYCIN 250 MG PO TABS: ORAL_TABLET | ORAL | 0 refills | Status: DC

## 2023-09-02 NOTE — Progress Notes (Signed)
 Subjective:    Patient ID: Tammy Proctor, female    DOB: 07/24/1955, 68 y.o.   MRN: [id number]   Chief Complaint: Cough and Nasal Congestion   Cough This is a new problem. The current episode started in the past 7 days. The problem has been waxing and waning. The problem occurs every few minutes. The cough is Productive of sputum. Associated symptoms include ear congestion and rhinorrhea. Pertinent negatives include no chills, fever, sore throat or shortness of breath. Nothing aggravates the symptoms. She has tried nothing for the symptoms. The treatment provided mild relief. smoker    Patient Active Problem List   Diagnosis Date Noted   Chronic obstructive pulmonary disease with acute exacerbation (HCC) 04/29/2023   History of colonic polyps 09/06/2022   Pulmonary nodules 06/25/2022   Chest pain 06/15/2022   Mass of colon-ulcerated mass of the transverse colon 06/15/2022   Abnormal CT scan, colon 01/18/2020   Hyperlipidemia 08/11/2018   Mood disorder (HCC) 08/11/2018   Anxiety 05/14/2016   Situational depression 05/16/2015   Primary hypertension 01/22/2015   Tobacco abuse 01/22/2015       Review of Systems  Constitutional:  Negative for chills and fever.  HENT:  Positive for rhinorrhea. Negative for sore throat.   Respiratory:  Positive for cough. Negative for shortness of breath.        Objective:   Physical Exam Constitutional:      Appearance: Normal appearance.  HENT:     Right Ear: Tympanic membrane normal.     Left Ear: Tympanic membrane normal.     Mouth/Throat:     Mouth: Mucous membranes are moist.  Eyes:     Pupils: Pupils are equal, round, and reactive to light.  Cardiovascular:     Rate and Rhythm: Normal rate and regular rhythm.  Pulmonary:     Effort: Pulmonary effort is normal.     Breath sounds: Wheezing (faint exp wheezes) present.  Skin:    General: Skin is warm.  Neurological:     General: No focal deficit present.     Mental  Status: She is alert and oriented to person, place, and time.  Psychiatric:        Mood and Affect: Mood normal.        Behavior: Behavior normal.     BP 133/73   Pulse 76   Temp (!) 97.5 F (36.4 C) (Temporal)   Ht 5' 3 (1.6 m)   Wt 110 lb (49.9 kg)   SpO2 98%   BMI 19.49 kg/m        Assessment & Plan:   Tilton SHAUNNA Pouch in today with chief complaint of Cough and Nasal Congestion   1. Acute cough (Primary) 1. Take meds as prescribed 2. Use a cool mist humidifier especially during the winter months and when heat has been humid. 3. Use saline nose sprays frequently 4. Saline irrigations of the nose can be very helpful if done frequently.  * 4X daily for 1 week*  * Use of a nettie pot can be helpful with this. Follow directions with this* 5. Drink plenty of fluids 6. Keep thermostat turn down low 7.For any cough or congestion- tessalon  perles 8. For fever or aces or pains- take tylenol  or ibuprofen appropriate for age and weight.  * for fevers greater than 101 orally you may alternate ibuprofen and tylenol  every  3 hours.    Meds ordered this encounter  Medications   azithromycin  (ZITHROMAX  Z-PAK) 250 MG  tablet    Sig: As directed    Dispense:  6 tablet    Refill:  0    Supervising Provider:   DETTINGER, JOSHUA A [[id number]]   benzonatate  (TESSALON  PERLES) 100 MG capsule    Sig: Take 1 capsule (100 mg total) by mouth 3 (three) times daily as needed for cough.    Dispense:  20 capsule    Refill:  0    Supervising Provider:   MARYANNE CHEW A [[id number]]      The above assessment and management plan was discussed with the patient. The patient verbalized understanding of and has agreed to the management plan. Patient is aware to call the clinic if symptoms persist or worsen. Patient is aware when to return to the clinic for a follow-up visit. Patient educated on when it is appropriate to go to the emergency department.   Mary-Margaret Gladis, FNP

## 2023-09-02 NOTE — Patient Instructions (Signed)

## 2023-10-24 ENCOUNTER — Ambulatory Visit

## 2023-10-30 ENCOUNTER — Ambulatory Visit: Admitting: Family Medicine

## 2023-10-30 ENCOUNTER — Ambulatory Visit: Payer: Self-pay | Admitting: *Deleted

## 2023-10-30 NOTE — Telephone Encounter (Signed)
 Pt has appt

## 2023-10-30 NOTE — Telephone Encounter (Signed)
 Copied from CRM #[id number]. Topic: Clinical - Red Word Triage >> Oct 30, 2023 11:41 AM Tammy Proctor wrote: [facility] that prompted transfer to Nurse Triage: elevated blood pressure in mornings, headaches, dizziness, difficulty breathing Reason for Disposition  [1] Systolic BP >= 130 OR Diastolic >= 80 AND [2] taking BP medications  Answer Assessment - Initial Assessment Questions 1. BLOOD PRESSURE: What is your blood pressure? Did you take at least two measurements 5 minutes apart?     My BP is high in the mornings.   It's been erratic.   When I wake up in the morning and I'm gagging and I can't breath.   I have not checked my BP yesterday or today.   I can't find my BP machine. I take medicine for it.   I'm having headaches and dizziness and every morning.  When I first wake up.    Pt giving very vague and short answers to triage questions.  2. ONSET: When did you take your blood pressure?     I can't find my BP machine.   It's been going on for 2 weeks now.    3. HOW: How did you take your blood pressure? (e.g., automatic home BP monitor, visiting nurse)     I've not checking it because I can't find my machine. 4. HISTORY: Do you have a history of high blood pressure?     Yes 5. MEDICINES: Are you taking any medicines for blood pressure? Have you missed any doses recently?     Yes I'm taking my medicines. 6. OTHER SYMPTOMS: Do you have any symptoms? (e.g., blurred vision, chest pain, difficulty breathing, headache, weakness)     Headaches, dizziness. 7. PREGNANCY: Is there any chance you are pregnant? When was your last menstrual period?     N/A due to age  Protocols used: Blood Pressure - High-A-AH FYI Only or Action Required?: FYI only for provider.  Patient was last seen in primary care on 09/02/2023 by Gladis Mustard, FNP.  Called Nurse Triage reporting Hypertension.Unable to give exact readings.   Having headaches and dizziness especially in the  mornings.  Symptoms began several weeks ago.For 2 weeks now  Interventions attempted: Prescription medications: taking rx BP medication.  Symptoms are: gradually worsening.  Triage Disposition: See PCP Within 2 Weeks  Patient/caregiver understands and will follow disposition?: Yes

## 2023-11-01 ENCOUNTER — Other Ambulatory Visit: Payer: Self-pay | Admitting: Family Medicine

## 2023-11-01 DIAGNOSIS — I10 Essential (primary) hypertension: Secondary | ICD-10-CM

## 2023-11-01 DIAGNOSIS — F4321 Adjustment disorder with depressed mood: Secondary | ICD-10-CM

## 2023-11-01 DIAGNOSIS — F419 Anxiety disorder, unspecified: Secondary | ICD-10-CM

## 2023-11-01 DIAGNOSIS — E782 Mixed hyperlipidemia: Secondary | ICD-10-CM

## 2023-11-01 MED ORDER — AMLODIPINE BESYLATE 5 MG PO TABS: 5.0000 mg | ORAL_TABLET | Freq: Every day | ORAL | 0 refills | Status: DC

## 2023-11-01 MED ORDER — ATORVASTATIN CALCIUM 40 MG PO TABS: 40.0000 mg | ORAL_TABLET | Freq: Every day | ORAL | 0 refills | Status: DC

## 2023-11-01 MED ORDER — SERTRALINE HCL 50 MG PO TABS: 50.0000 mg | ORAL_TABLET | Freq: Every day | ORAL | 0 refills | Status: DC

## 2023-11-01 NOTE — Telephone Encounter (Signed)
 Copied from CRM [id number]. Topic: Clinical - Medication Refill >> Nov 01, 2023 10:03 AM Cherylann RAMAN wrote: Medication:  amLODipine  (NORVASC ) 5 MG tablet  atorvastatin  (LIPITOR) 40 MG tablet   sertraline  (ZOLOFT ) 50 MG tablet  Has the patient contacted their pharmacy? No (Agent: If no, request that the patient contact the pharmacy for the refill. If patient does not wish to contact the pharmacy document the reason why and proceed with request.) (Agent: If yes, when and what did the pharmacy advise?) out of refills  This is the patient's preferred pharmacy:  [facility] - [place], [place] - [address] [place] [address] Phone: [phone] Fax: [phone]  Is this the correct pharmacy for this prescription? Yes If no, delete pharmacy and type the correct one.   Has the prescription been filled recently? No  Is the patient out of the medication? Yes  Has the patient been seen for an appointment in the last year OR does the patient have an upcoming appointment? Yes  Can we respond through MyChart? No, prefers a phone  Agent: Please be advised that Rx refills may take up to 3 business days. We ask that you follow-up with your pharmacy.

## 2023-11-07 ENCOUNTER — Ambulatory Visit

## 2023-11-14 ENCOUNTER — Ambulatory Visit: Admitting: Family Medicine

## 2023-11-20 ENCOUNTER — Encounter: Payer: Self-pay | Admitting: Family Medicine

## 2023-11-26 ENCOUNTER — Ambulatory Visit

## 2023-12-02 ENCOUNTER — Ambulatory Visit (INDEPENDENT_AMBULATORY_CARE_PROVIDER_SITE_OTHER): Admitting: Family Medicine

## 2023-12-02 ENCOUNTER — Encounter: Payer: Self-pay | Admitting: Family Medicine

## 2023-12-02 VITALS — BP 138/70 | HR 73 | Temp 97.9°F | Ht 63.0 in | Wt 110.8 lb

## 2023-12-02 DIAGNOSIS — Z23 Encounter for immunization: Secondary | ICD-10-CM

## 2023-12-02 DIAGNOSIS — J0100 Acute maxillary sinusitis, unspecified: Secondary | ICD-10-CM | POA: Diagnosis not present

## 2023-12-02 DIAGNOSIS — J441 Chronic obstructive pulmonary disease with (acute) exacerbation: Secondary | ICD-10-CM

## 2023-12-02 DIAGNOSIS — L819 Disorder of pigmentation, unspecified: Secondary | ICD-10-CM

## 2023-12-02 MED ORDER — PREDNISONE 20 MG PO TABS: 40.0000 mg | ORAL_TABLET | Freq: Every day | ORAL | 0 refills | Status: AC

## 2023-12-02 MED ORDER — DOXYCYCLINE HYCLATE 100 MG PO TABS: 100.0000 mg | ORAL_TABLET | Freq: Two times a day (BID) | ORAL | 0 refills | Status: AC

## 2023-12-02 NOTE — Progress Notes (Signed)
 Acute Office Visit  Subjective:     Patient ID: Tammy Proctor, female    DOB: November 16, 1955, 68 y.o.   MRN: [id number]  Chief Complaint  Patient presents with   Cough    Cough     History of Present Illness   Tammy Proctor is a 68 year old female who presents with a pigmented lesion on her leg and persistent cough.  Pigmented cutaneous lesion - Pigmented lesion on leg present for a long duration - Recent increase in size over the past few months - Lesion is darker in the center - No bleeding from the lesion - Requests examination of a couple of additional moles  Productive cough and upper respiratory symptoms - Cough present for the past 1-2 weeks - Cough is productive of yellow sputum - Associated symptoms include headache, nasal congestion, and sinus pressure - Wheezing and occasional ear popping - No fever, chest pain, or sore throat - Attributes symptoms to changes in the weather  Allergic rhinitis and medication use - Has not been taking Xyzal  (levocetirizine) regularly, which was prescribed for allergies - Using Flonase  nasal spray - Uses albuterol  infrequently - Taking Benadryl with no significant improvement in cough  Prior treatment response - Previously treated with doxycycline  and prednisone  for similar symptoms with good effect      Review of Systems  Respiratory:  Positive for cough.    As per HPI.      Objective:    BP 138/70   Pulse 73   Temp 97.9 F (36.6 C) (Temporal)   Ht 5' 3 (1.6 m)   Wt 110 lb 12.8 oz (50.3 kg)   SpO2 99%   BMI 19.63 kg/m    Physical Exam Vitals and nursing note reviewed.  Constitutional:      General: She is not in acute distress.    Appearance: She is not ill-appearing, toxic-appearing or diaphoretic.  HENT:     Right Ear: Tympanic membrane, ear canal and external ear normal.     Left Ear: Tympanic membrane, ear canal and external ear normal.     Nose: Congestion present.     Right Sinus:  Maxillary sinus tenderness present. No frontal sinus tenderness.     Left Sinus: Maxillary sinus tenderness present. No frontal sinus tenderness.     Mouth/Throat:     Mouth: Mucous membranes are moist.     Pharynx: Oropharynx is clear. No oropharyngeal exudate or posterior oropharyngeal erythema.  Eyes:     General:        Right eye: No discharge.        Left eye: No discharge.     Conjunctiva/sclera: Conjunctivae normal.  Cardiovascular:     Rate and Rhythm: Normal rate and regular rhythm.     Heart sounds: Normal heart sounds. No murmur heard. Pulmonary:     Effort: Pulmonary effort is normal. No respiratory distress.     Breath sounds: Normal breath sounds. No wheezing, rhonchi or rales.  Musculoskeletal:     Cervical back: Neck supple. No rigidity.     Right lower leg: No edema.     Left lower leg: No edema.  Skin:    General: Skin is warm and dry.     Findings: Lesion (anterior upper left thigh. Pictured below.) present.  Neurological:     General: No focal deficit present.     Mental Status: She is alert and oriented to person, place, and time.  Psychiatric:  Mood and Affect: Mood normal.        Behavior: Behavior normal.     No results found for any visits on 12/02/23.      Assessment & Plan:   Leisa was seen today for cough.  Diagnoses and all orders for this visit:  COPD with exacerbation (HCC) Doxycyline and prednisone  as below. Mucinex BID for cough. Continue albuterol  prn.  -     doxycycline  (VIBRA -TABS) 100 MG tablet; Take 1 tablet (100 mg total) by mouth 2 (two) times daily for 7 days. -     predniSONE  (DELTASONE ) 20 MG tablet; Take 2 tablets (40 mg total) by mouth daily with breakfast for 5 days.  Acute non-recurrent maxillary sinusitis Doxycyline as below. Restart xyzal . Continue flonase .  -     doxycycline  (VIBRA -TABS) 100 MG tablet; Take 1 tablet (100 mg total) by mouth 2 (two) times daily for 7 days.  Atypical pigmented skin lesion -      Ambulatory referral to Dermatology  Encounter for immunization -     Flu vaccine HIGH DOSE PF(Fluzone Trivalent)  Return for schedule chronic follow up.  The patient indicates understanding of these issues and agrees with the plan.  Tammy Proctor Search, FNP

## 2023-12-03 ENCOUNTER — Ambulatory Visit
Admission: RE | Admit: 2023-12-03 | Discharge: 2023-12-03 | Disposition: A | Discharge status: Home / Self Care | Source: Ambulatory Visit | Attending: Family Medicine | Admitting: Family Medicine

## 2023-12-03 DIAGNOSIS — Z0000 Encounter for general adult medical examination without abnormal findings: Secondary | ICD-10-CM

## 2023-12-11 ENCOUNTER — Ambulatory Visit: Admitting: Internal Medicine

## 2023-12-12 ENCOUNTER — Ambulatory Visit: Admitting: Internal Medicine

## 2023-12-27 ENCOUNTER — Encounter: Payer: Self-pay | Admitting: Family Medicine

## 2023-12-27 ENCOUNTER — Ambulatory Visit (INDEPENDENT_AMBULATORY_CARE_PROVIDER_SITE_OTHER): Admitting: Family Medicine

## 2023-12-27 VITALS — BP 141/78 | HR 65 | Temp 97.1°F | Ht 63.0 in | Wt 112.8 lb

## 2023-12-27 DIAGNOSIS — Z20828 Contact with and (suspected) exposure to other viral communicable diseases: Secondary | ICD-10-CM

## 2023-12-27 LAB — VERITOR FLU A/B WAIVED
Influenza A: NEGATIVE
Influenza B: NEGATIVE

## 2023-12-27 MED ORDER — OSELTAMIVIR PHOSPHATE 75 MG PO CAPS: 75.0000 mg | ORAL_CAPSULE | Freq: Two times a day (BID) | ORAL | 0 refills | Status: AC

## 2023-12-27 NOTE — Progress Notes (Signed)
 BP (!) 141/78   Pulse 65   Temp (!) 97.1 F (36.2 C)   Ht 5' 3 (1.6 m)   Wt 112 lb 12.8 oz (51.2 kg)   SpO2 98%   BMI 19.98 kg/m    Subjective:   Patient ID: NASREEN GOEDECKE, female    DOB: August 19, 1955, 68 y.o.   MRN: [id number]  HPI: NAARA KELTY is a 68 y.o. female presenting on 12/27/2023 for Headache, Diarrhea, Emesis, Chills, Generalized Body Aches, Cough, and Fatigue   Discussed the use of AI scribe software for clinical note transcription with the patient, who gave verbal consent to proceed.  History of Present Illness   BRAYLEI TOTINO is a 68 year old female who presents with chills, diarrhea, and vomiting.  Gastrointestinal and constitutional symptoms - Chills, diarrhea, and vomiting began gradually at the beginning of the week, with peak severity on Wednesday - Symptoms included weakness and feeling cold, requiring extra warmth - Unable to eat during the period of vomiting and diarrhea - No fever during the illness - Symptoms have resolved by today  Respiratory symptoms - Coughing and wheezing present - Breathing improved with use of albuterol  inhaler - No current diagnosis of COPD, emphysema, or asthma - History of pneumonia last year  Tobacco use - Smoker, but has reduced smoking due to recent illness          Relevant past medical, surgical, family and social history reviewed and updated as indicated. Interim medical history since our last visit reviewed. Allergies and medications reviewed and updated.  Review of Systems  Constitutional:  Negative for chills and fever.  HENT:  Positive for congestion, postnasal drip, rhinorrhea, sinus pressure, sneezing and sore throat. Negative for ear discharge and ear pain.   Eyes:  Negative for pain, redness and visual disturbance.  Respiratory:  Positive for cough and wheezing. Negative for chest tightness and shortness of breath.   Cardiovascular:  Negative for chest pain and leg swelling.   Gastrointestinal:  Positive for diarrhea and nausea.  Genitourinary:  Negative for difficulty urinating and dysuria.  Musculoskeletal:  Positive for myalgias. Negative for back pain and gait problem.  Skin:  Negative for rash.  Neurological:  Negative for light-headedness and headaches.  Psychiatric/Behavioral:  Negative for agitation and behavioral problems.   All other systems reviewed and are negative.   Per HPI unless specifically indicated above   Allergies as of 12/27/2023       Reactions   Ceclor [cefaclor] Hives        Medication List        Accurate as of December 27, 2023  3:34 PM. If you have any questions, ask your nurse or doctor.          acetaminophen  325 MG tablet Commonly known as: TYLENOL  Take 2 tablets (650 mg total) by mouth every 6 (six) hours as needed for mild pain (or Fever >/= 101).   albuterol  108 (90 Base) MCG/ACT inhaler Commonly known as: VENTOLIN  HFA Inhale 2 puffs into the lungs every 6 (six) hours as needed for wheezing or shortness of breath.   amLODipine  5 MG tablet Commonly known as: NORVASC  Take 1 tablet (5 mg total) by mouth daily.   aspirin  EC 81 MG tablet Take 1 tablet (81 mg total) by mouth daily with breakfast. Swallow whole.   atorvastatin  40 MG tablet Commonly known as: LIPITOR Take 1 tablet (40 mg total) by mouth daily.   fluticasone  50 MCG/ACT nasal spray Commonly  known as: FLONASE  Place 2 sprays into both nostrils daily.   levocetirizine 5 MG tablet Commonly known as: XYZAL  Take 1 tablet (5 mg total) by mouth every evening.   oseltamivir 75 MG capsule Commonly known as: Tamiflu Take 1 capsule (75 mg total) by mouth 2 (two) times daily for 5 days. Started by: Fonda LABOR Wanette Robison   sertraline  50 MG tablet Commonly known as: Zoloft  Take 1 tablet (50 mg total) by mouth daily.         Objective:   BP (!) 141/78   Pulse 65   Temp (!) 97.1 F (36.2 C)   Ht 5' 3 (1.6 m)   Wt 112 lb 12.8 oz (51.2 kg)    SpO2 98%   BMI 19.98 kg/m   Wt Readings from Last 3 Encounters:  12/27/23 112 lb 12.8 oz (51.2 kg)  12/02/23 110 lb 12.8 oz (50.3 kg)  09/02/23 110 lb (49.9 kg)    Physical Exam Physical Exam   HEENT: Throat erythematous. CHEST: Lungs clear to auscultation bilaterally. CARDIOVASCULAR: Heart regular rate and rhythm, normal heart sounds.         Assessment & Plan:   Problem List Items Addressed This Visit   None Visit Diagnoses       Exposure to the flu    -  Primary   Relevant Orders   COVID-19, Flu A+B and RSV   Veritor Flu A/B Waived          Influenza Symptoms consistent with influenza. Likely exposure from son. Treated with Tamiflu based on exposure and symptoms. - Prescribe Tamiflu. - Recommend Mucinex for congestion. - Advise continued use of albuterol  inhaler.  Smoking Smoker with reduced smoking during illness. No COPD, emphysema, or asthma. - Advise smoking reduction, especially during illness.      Rapid flu is negative but with exposure and symptoms, it sounds like influenza and will prescribe Tamiflu.    Follow up plan: Return if symptoms worsen or fail to improve.  Counseling provided for all of the vaccine components Orders Placed This Encounter  Procedures   COVID-19, Flu A+B and RSV   Veritor Flu A/B Waived    Fonda Levins, MD [facility] 12/27/2023, 3:34 PM

## 2023-12-28 LAB — COVID-19, FLU A+B AND RSV
Influenza A, NAA: NOT DETECTED
Influenza B, NAA: NOT DETECTED
RSV, NAA: NOT DETECTED
SARS-CoV-2, NAA: NOT DETECTED

## 2023-12-30 ENCOUNTER — Ambulatory Visit: Payer: Self-pay | Admitting: Family Medicine

## 2024-01-22 ENCOUNTER — Other Ambulatory Visit: Payer: Self-pay | Admitting: Family Medicine

## 2024-01-22 DIAGNOSIS — I10 Essential (primary) hypertension: Secondary | ICD-10-CM

## 2024-01-22 DIAGNOSIS — E782 Mixed hyperlipidemia: Secondary | ICD-10-CM

## 2024-01-22 DIAGNOSIS — F419 Anxiety disorder, unspecified: Secondary | ICD-10-CM

## 2024-01-22 DIAGNOSIS — F4321 Adjustment disorder with depressed mood: Secondary | ICD-10-CM

## 2024-01-22 NOTE — Telephone Encounter (Signed)
 Copied from CRM #[id number]. Topic: Clinical - Medication Refill >> Jan 22, 2024  2:29 PM Lauren C wrote: Medication:   amLODipine  (NORVASC ) 5 MG tablet sertraline  (ZOLOFT ) 50 MG tablet atorvastatin  (LIPITOR) 40 MG tablet aspirin  EC 81 MG tablet  Has the patient contacted their pharmacy? No  This is the patient's preferred pharmacy:  [facility] - [place], [place] - [address] [place] [address] Phone: [phone] Fax: [phone]  Is this the correct pharmacy for this prescription? Yes If no, delete pharmacy and type the correct one.   Has the prescription been filled recently? Yes  Is the patient out of the medication? Will be out 12/1  Has the patient been seen for an appointment in the last year OR does the patient have an upcoming appointment? Yes, last seen 10/31  Can we respond through MyChart? Please call [phone]  Agent: Please be advised that Rx refills may take up to 3 business days. We ask that you follow-up with your pharmacy.

## 2024-01-29 NOTE — Telephone Encounter (Signed)
 Ok to fill? Last chronic follow up 12/28/2022. No appt scheduled.

## 2024-01-30 MED ORDER — ATORVASTATIN CALCIUM 40 MG PO TABS: 40.0000 mg | ORAL_TABLET | Freq: Every day | ORAL | 0 refills | Status: AC

## 2024-01-30 MED ORDER — AMLODIPINE BESYLATE 5 MG PO TABS: 5.0000 mg | ORAL_TABLET | Freq: Every day | ORAL | 0 refills | Status: AC

## 2024-01-30 MED ORDER — ASPIRIN 81 MG PO TBEC: 81.0000 mg | DELAYED_RELEASE_TABLET | Freq: Every day | ORAL | 5 refills | Status: AC

## 2024-01-30 MED ORDER — SERTRALINE HCL 50 MG PO TABS: 50.0000 mg | ORAL_TABLET | Freq: Every day | ORAL | 0 refills | Status: AC

## 2024-03-03 ENCOUNTER — Ambulatory Visit (INDEPENDENT_AMBULATORY_CARE_PROVIDER_SITE_OTHER): Payer: Self-pay

## 2024-03-03 VITALS — Ht 63.0 in | Wt 112.0 lb

## 2024-03-03 DIAGNOSIS — Z0000 Encounter for general adult medical examination without abnormal findings: Secondary | ICD-10-CM

## 2024-03-03 NOTE — Progress Notes (Signed)
 "  Chief Complaint  Patient presents with   Medicare Wellness     Subjective:   Tammy Proctor is a 69 y.o. female who presents for a Medicare Annual Wellness Visit.  Visit info / Clinical Intake: Medicare Wellness Visit Type:: Subsequent Annual Wellness Visit Persons participating in visit and providing information:: patient Medicare Wellness Visit Mode:: Telephone If telephone:: video declined Since this visit was completed virtually, some vitals may be partially provided or unavailable. Missing vitals are due to the limitations of the virtual format.: Documented vitals are patient reported If Telephone or Video please confirm:: I connected with patient using audio/video enable telemedicine. I verified patient identity with two identifiers, discussed telehealth limitations, and patient agreed to proceed. Patient Location:: home Provider Location:: office Interpreter Needed?: No Pre-visit prep was completed: yes AWV questionnaire completed by patient prior to visit?: no Living arrangements:: lives with spouse/significant other Patient's Overall Health Status Rating: good Typical amount of pain: some Does pain affect daily life?: no Are you currently prescribed opioids?: no  Dietary Habits and Nutritional Risks How many meals a day?: 3 Eats fruit and vegetables daily?: yes Most meals are obtained by: preparing own meals In the last 2 weeks, have you had any of the following?: none Diabetic:: no  Functional Status Activities of Daily Living (to include ambulation/medication): Independent Ambulation: Independent Medication Administration: Independent Home Management (perform basic housework or laundry): Independent Manage your own finances?: yes Primary transportation is: driving Concerns about vision?: no *vision screening is required for WTM* Concerns about hearing?: no  Fall Screening Falls in the past year?: 0 Number of falls in past year: 0 Was there an injury  with Fall?: 0 Fall Risk Category Calculator: 0 Patient Fall Risk Level: Low Fall Risk  Fall Risk Patient at Risk for Falls Due to: No Fall Risks Fall risk Follow up: Falls prevention discussed; Education provided; Falls evaluation completed  Home and Transportation Safety: All rugs have non-skid backing?: yes All stairs or steps have railings?: yes Grab bars in the bathtub or shower?: yes Have non-skid surface in bathtub or shower?: yes Good home lighting?: yes Regular seat belt use?: yes Hospital stays in the last year:: no  Cognitive Assessment Difficulty concentrating, remembering, or making decisions? : no Will 6CIT or Mini Cog be Completed: no 6CIT or Mini Cog Declined: patient alert, oriented, able to answer questions appropriately and recall recent events  Advance Directives (For Healthcare) Does Patient Have a Medical Advance Directive?: No Would patient like information on creating a medical advance directive?: Yes (MAU/Ambulatory/Procedural Areas - Information given)  Reviewed/Updated  Reviewed/Updated: Reviewed All (Medical, Surgical, Family, Medications, Allergies, Care Teams, Patient Goals)    Allergies (verified) Ceclor [cefaclor]   Current Medications (verified) Outpatient Encounter Medications as of 03/03/2024  Medication Sig   acetaminophen  (TYLENOL ) 325 MG tablet Take 2 tablets (650 mg total) by mouth every 6 (six) hours as needed for mild pain (or Fever >/= 101).   albuterol  (VENTOLIN  HFA) 108 (90 Base) MCG/ACT inhaler Inhale 2 puffs into the lungs every 6 (six) hours as needed for wheezing or shortness of breath.   amLODipine  (NORVASC ) 5 MG tablet Take 1 tablet (5 mg total) by mouth daily.   aspirin  EC 81 MG tablet Take 1 tablet (81 mg total) by mouth daily with breakfast. Swallow whole.   atorvastatin  (LIPITOR) 40 MG tablet Take 1 tablet (40 mg total) by mouth daily.   fluticasone  (FLONASE ) 50 MCG/ACT nasal spray Place 2 sprays into both nostrils daily.  sertraline  (ZOLOFT ) 50 MG tablet Take 1 tablet (50 mg total) by mouth daily.   levocetirizine (XYZAL ) 5 MG tablet Take 1 tablet (5 mg total) by mouth every evening. (Patient not taking: Reported on 03/03/2024)   No facility-administered encounter medications on file as of 03/03/2024.    History: Past Medical History:  Diagnosis Date   Anxiety    Depression    HLD (hyperlipidemia)    Hypertension    Migraine    resolved   Past Surgical History:  Procedure Laterality Date   BIOPSY  01/25/2020   Procedure: BIOPSY;  Surgeon: Cindie Carlin POUR, DO;  Location: [facility];  Service: Endoscopy;;   BREAST EXCISIONAL BIOPSY Right    benign   CATARACT EXTRACTION Right    CATARACT EXTRACTION W/PHACO Left 10/12/2022   Procedure: CATARACT EXTRACTION PHACO AND INTRAOCULAR LENS PLACEMENT (IOC);  Surgeon: Harrie Agent, MD;  Location: [facility];  Service: Ophthalmology;  Laterality: Left;  CDE 85.95   COLONOSCOPY WITH PROPOFOL  N/A 01/25/2020   Procedure: COLONOSCOPY WITH PROPOFOL ;  Surgeon: Cindie Carlin POUR, DO;  Location: [facility];  Service: Endoscopy;  Laterality: N/A;  9:00am   COLONOSCOPY WITH PROPOFOL  N/A 03/03/2020   Procedure: COLONOSCOPY WITH PROPOFOL ;  Surgeon: Cindie Carlin POUR, DO;  Location: [facility];  Service: Endoscopy;  Laterality: N/A;  10:15am   COLONOSCOPY WITH PROPOFOL  N/A 01/21/2023   Procedure: COLONOSCOPY WITH PROPOFOL ;  Surgeon: Cindie Carlin POUR, DO;  [facility]: [facility];  Service: Endoscopy;  Laterality: N/A;  12:30 PM, ASA 3   NASAL SINUS SURGERY     POLYPECTOMY  03/03/2020   Procedure: POLYPECTOMY;  Surgeon: Cindie Carlin POUR, DO;  [facility]: [facility];  Service: Endoscopy;;   POLYPECTOMY  01/21/2023   Procedure: POLYPECTOMY;  Surgeon: Cindie Carlin POUR, DO;  Location: [facility];  Service: Endoscopy;;   TUBAL LIGATION     Family History  Problem Relation Age of Onset   Cancer Mother        breast   Diabetes Mother    Heart disease Father    Mental  illness Father    Heart attack Father    Heart attack Sister    Heart disease Sister    Heart disease Sister    Diabetes Sister    Cancer Brother        prostate   Diabetes Brother    Colon cancer Neg Hx    Breast cancer Neg Hx    Social History   Occupational History   Not on file  Tobacco Use   Smoking status: Every Day    Current packs/day: 0.50    Average packs/day: 0.5 packs/day for 53.5 years (26.7 ttl pk-yrs)    Types: Cigarettes    Start date: 08/12/1974   Smokeless tobacco: Never  Vaping Use   Vaping status: Never Used  Substance and Sexual Activity   Alcohol use: Yes    Alcohol/week: 6.0 standard drinks of alcohol    Types: 6 Cans of beer per week    Comment: occasional   Drug use: Yes    Types: Marijuana    Comment: once per month 01/20/2023 last used   Sexual activity: Yes    Birth control/protection: Surgical   Tobacco Counseling Ready to quit: Not Answered Counseling given: Not Answered  SDOH Screenings   Food Insecurity: No Food Insecurity (03/03/2024)  Housing: Low Risk (03/03/2024)  Transportation Needs: No Transportation Needs (03/03/2024)  Utilities: Not At Risk (03/03/2024)  Alcohol Screen:  Low Risk (10/03/2022)  Depression (PHQ2-9): Low Risk (03/03/2024)  Financial Resource Strain: Low Risk (10/03/2022)  Physical Activity: Insufficiently Active (03/03/2024)  Social Connections: Moderately Isolated (03/03/2024)  Stress: No Stress Concern Present (03/03/2024)  Tobacco Use: High Risk (03/03/2024)  Health Literacy: Adequate Health Literacy (03/03/2024)   See flowsheets for full screening details  Depression Screen PHQ 2 & 9 Depression Scale- Over the past 2 weeks, how often have you been bothered by any of the following problems? Little interest or pleasure in doing things: 0 Feeling down, depressed, or hopeless (PHQ Adolescent also includes...irritable): 0 PHQ-2 Total Score: 0 Trouble falling or staying asleep, or sleeping too much: 0 Feeling tired or having  little energy: 1 Poor appetite or overeating (PHQ Adolescent also includes...weight loss): 1 Feeling bad about yourself - or that you are a failure or have let yourself or your family down: 0 Trouble concentrating on things, such as reading the newspaper or watching television (PHQ Adolescent also includes...like school work): 0 Moving or speaking so slowly that other people could have noticed. Or the opposite - being so fidgety or restless that you have been moving around a lot more than usual: 0 Thoughts that you would be better off dead, or of hurting yourself in some way: 0 PHQ-9 Total Score: 2 If you checked off any problems, how difficult have these problems made it for you to do your work, take care of things at home, or get along with other people?: Not difficult at all  Depression Treatment Depression Interventions/Treatment : EYV7-0 Score <4 Follow-up Not Indicated     Goals Addressed             This Visit's Progress    Maintain health and independence   On track            Objective:    Today's Vitals   03/03/24 1449  Weight: 112 lb (50.8 kg)  Height: 5' 3 (1.6 m)   Body mass index is 19.84 kg/m.  Hearing/Vision screen Hearing Screening - Comments:: Patient is able to hear conversational tones without difficulty. No issues reported.   Vision Screening - Comments:: Up to date with routine eye exams   Immunizations and Health Maintenance Health Maintenance  Topic Date Due   Zoster Vaccines- Shingrix (1 of 2) Never done   Bone Density Scan  Never done   COVID-19 Vaccine (3 - 2025-26 season) 10/28/2023   Lung Cancer Screening  04/24/2024   Medicare Annual Wellness (AWV)  03/03/2025   Mammogram  12/02/2025   DTaP/Tdap/Td (2 - Td or Tdap) 06/24/2032   Colonoscopy  01/20/2033   Pneumococcal Vaccine: 50+ Years  Completed   Influenza Vaccine  Completed   Hepatitis C Screening  Completed   Meningococcal B Vaccine  Aged Out        Assessment/Plan:  This  is a routine wellness examination for [place].  Patient Care Team: Joesph Annabella HERO, FNP as PCP - General (Family Medicine) Alvan, Dorn FALCON, MD as PCP - Cardiology (Cardiology) Darlean Ozell NOVAK, MD as Consulting Physician (Pulmonary Disease)  I have personally reviewed and noted the following in the patients chart:   Medical and social history Use of alcohol, tobacco or illicit drugs  Current medications and supplements including opioid prescriptions. Functional ability and status Nutritional status Physical activity Advanced directives List of other physicians Hospitalizations, surgeries, and ER visits in previous 12 months Vitals Screenings to include cognitive, depression, and falls Referrals and appointments  No orders of the  defined types were placed in this encounter.  In addition, I have reviewed and discussed with patient certain preventive protocols, quality metrics, and best practice recommendations. A written personalized care plan for preventive services as well as general preventive health recommendations were provided to patient.   Lavelle Charmaine Browner, LPN   09/29/7971   Return in 1 year (on 03/03/2025).  After Visit Summary: (Mail) Due to this being a telephonic visit, the after visit summary with patients personalized plan was offered to patient via mail   Nurse Notes: No voiced or noted concerns at this time  "

## 2024-03-03 NOTE — Patient Instructions (Signed)
 Tammy Proctor,  Thank you for taking the time for your Medicare Wellness Visit. I appreciate your continued commitment to your health goals. Please review the care plan we discussed, and feel free to reach out if I can assist you further.  Please note that Annual Wellness Visits do not include a physical exam. Some assessments may be limited, especially if the visit was conducted virtually. If needed, we may recommend an in-person follow-up with your provider.  Ongoing Care Seeing your primary care provider every 3 to 6 months helps us  monitor your health and provide consistent, personalized care.   Referrals If a referral was made during today's visit and you haven't received any updates within two weeks, please contact the referred provider directly to check on the status.  Recommended Screenings:  Health Maintenance  Topic Date Due   Zoster (Shingles) Vaccine (1 of 2) Never done   Osteoporosis screening with Bone Density Scan  Never done   COVID-19 Vaccine (3 - 2025-26 season) 10/28/2023   Screening for Lung Cancer  04/24/2024   Medicare Annual Wellness Visit  03/03/2025   Breast Cancer Screening  12/02/2025   DTaP/Tdap/Td vaccine (2 - Td or Tdap) 06/24/2032   Colon Cancer Screening  01/20/2033   Pneumococcal Vaccine for age over 84  Completed   Flu Shot  Completed   Hepatitis C Screening  Completed   Meningitis B Vaccine  Aged Out       03/03/2024    2:52 PM  Advanced Directives  Does Patient Have a Medical Advance Directive? No  Would patient like information on creating a medical advance directive? Yes (MAU/Ambulatory/Procedural Areas - Information given)   Information on Advanced Care Planning can be found at [place]  Secretary of [facility] Advance Health Care Directives Advance Health Care Directives ([email or web address]   Vision: Annual vision screenings are recommended for early detection of glaucoma, cataracts, and diabetic retinopathy. These exams can also reveal signs of  chronic conditions such as diabetes and high blood pressure.  Dental: Annual dental screenings help detect early signs of oral cancer, gum disease, and other conditions linked to overall health, including heart disease and diabetes.  Please see the attached documents for additional preventive care recommendations.

## 2024-03-09 ENCOUNTER — Other Ambulatory Visit: Payer: Self-pay | Admitting: Family Medicine

## 2024-03-09 DIAGNOSIS — I10 Essential (primary) hypertension: Secondary | ICD-10-CM

## 2024-09-08 ENCOUNTER — Ambulatory Visit: Admitting: Physician Assistant
